# Patient Record
Sex: Female | Born: 1970 | Race: White | Hispanic: No | Marital: Single | Smoking: Former smoker
Health system: Southern US, Community
[De-identification: ages and names within clinical notes are randomized; demographics above are authoritative.]

## PROBLEM LIST (undated history)

## (undated) DIAGNOSIS — F32A Depression, unspecified: Secondary | ICD-10-CM

## (undated) DIAGNOSIS — E785 Hyperlipidemia, unspecified: Secondary | ICD-10-CM

## (undated) DIAGNOSIS — I509 Heart failure, unspecified: Secondary | ICD-10-CM

## (undated) DIAGNOSIS — F4310 Post-traumatic stress disorder, unspecified: Secondary | ICD-10-CM

## (undated) DIAGNOSIS — R9389 Abnormal findings on diagnostic imaging of other specified body structures: Secondary | ICD-10-CM

## (undated) DIAGNOSIS — F419 Anxiety disorder, unspecified: Secondary | ICD-10-CM

## (undated) DIAGNOSIS — Z720 Tobacco use: Secondary | ICD-10-CM

## (undated) DIAGNOSIS — I5020 Unspecified systolic (congestive) heart failure: Secondary | ICD-10-CM

## (undated) DIAGNOSIS — F329 Major depressive disorder, single episode, unspecified: Secondary | ICD-10-CM

## (undated) DIAGNOSIS — J449 Chronic obstructive pulmonary disease, unspecified: Secondary | ICD-10-CM

## (undated) DIAGNOSIS — I10 Essential (primary) hypertension: Secondary | ICD-10-CM

## (undated) HISTORY — DX: Hyperlipidemia, unspecified: E78.5

## (undated) HISTORY — DX: Tobacco use: Z72.0

## (undated) HISTORY — DX: Unspecified systolic (congestive) heart failure: I50.20

## (undated) HISTORY — DX: Heart failure, unspecified: I50.9

## (undated) HISTORY — DX: Abnormal findings on diagnostic imaging of other specified body structures: R93.89

## (undated) HISTORY — PX: BACK SURGERY: SHX140

---

## 1999-11-10 ENCOUNTER — Emergency Department (HOSPITAL_COMMUNITY): Admission: EM | Admit: 1999-11-10 | Discharge: 1999-11-10 | Payer: Self-pay | Admitting: Emergency Medicine

## 2001-12-23 ENCOUNTER — Encounter: Payer: Self-pay | Admitting: Emergency Medicine

## 2001-12-23 ENCOUNTER — Emergency Department (HOSPITAL_COMMUNITY): Admission: EM | Admit: 2001-12-23 | Discharge: 2001-12-23 | Payer: Self-pay | Admitting: Emergency Medicine

## 2003-03-05 ENCOUNTER — Emergency Department (HOSPITAL_COMMUNITY): Admission: EM | Admit: 2003-03-05 | Discharge: 2003-03-05 | Payer: Self-pay | Admitting: Emergency Medicine

## 2004-05-15 ENCOUNTER — Emergency Department (HOSPITAL_COMMUNITY): Admission: EM | Admit: 2004-05-15 | Discharge: 2004-05-15 | Payer: Self-pay | Admitting: Emergency Medicine

## 2004-07-26 ENCOUNTER — Ambulatory Visit (HOSPITAL_COMMUNITY): Admission: RE | Admit: 2004-07-26 | Discharge: 2004-07-26 | Payer: Self-pay | Admitting: *Deleted

## 2004-09-16 ENCOUNTER — Ambulatory Visit (HOSPITAL_COMMUNITY): Admission: RE | Admit: 2004-09-16 | Discharge: 2004-09-16 | Payer: Self-pay | Admitting: *Deleted

## 2006-02-14 ENCOUNTER — Emergency Department (HOSPITAL_COMMUNITY): Admission: EM | Admit: 2006-02-14 | Discharge: 2006-02-14 | Payer: Self-pay | Admitting: Emergency Medicine

## 2006-02-18 ENCOUNTER — Ambulatory Visit: Payer: Self-pay | Admitting: Internal Medicine

## 2006-03-22 ENCOUNTER — Emergency Department (HOSPITAL_COMMUNITY): Admission: EM | Admit: 2006-03-22 | Discharge: 2006-03-22 | Payer: Self-pay | Admitting: Emergency Medicine

## 2006-12-01 ENCOUNTER — Emergency Department (HOSPITAL_COMMUNITY): Admission: EM | Admit: 2006-12-01 | Discharge: 2006-12-01 | Payer: Self-pay | Admitting: Emergency Medicine

## 2007-04-20 ENCOUNTER — Ambulatory Visit (HOSPITAL_COMMUNITY): Admission: RE | Admit: 2007-04-20 | Discharge: 2007-04-21 | Payer: Self-pay | Admitting: Neurosurgery

## 2007-04-20 ENCOUNTER — Encounter: Payer: Self-pay | Admitting: Orthopedic Surgery

## 2007-09-19 ENCOUNTER — Ambulatory Visit (HOSPITAL_COMMUNITY): Admission: RE | Admit: 2007-09-19 | Discharge: 2007-09-19 | Payer: Self-pay | Admitting: Internal Medicine

## 2007-12-11 ENCOUNTER — Emergency Department (HOSPITAL_COMMUNITY): Admission: EM | Admit: 2007-12-11 | Discharge: 2007-12-11 | Payer: Self-pay | Admitting: Emergency Medicine

## 2008-04-30 ENCOUNTER — Emergency Department (HOSPITAL_COMMUNITY): Admission: EM | Admit: 2008-04-30 | Discharge: 2008-04-30 | Payer: Self-pay | Admitting: Emergency Medicine

## 2008-07-04 ENCOUNTER — Encounter (HOSPITAL_COMMUNITY): Admission: RE | Admit: 2008-07-04 | Discharge: 2008-08-03 | Payer: Self-pay | Admitting: Family Medicine

## 2008-09-05 ENCOUNTER — Ambulatory Visit (HOSPITAL_COMMUNITY): Admission: RE | Admit: 2008-09-05 | Discharge: 2008-09-05 | Payer: Self-pay | Admitting: Family Medicine

## 2008-10-07 ENCOUNTER — Emergency Department (HOSPITAL_COMMUNITY): Admission: EM | Admit: 2008-10-07 | Discharge: 2008-10-07 | Payer: Self-pay | Admitting: Emergency Medicine

## 2008-11-13 ENCOUNTER — Ambulatory Visit (HOSPITAL_COMMUNITY): Admission: RE | Admit: 2008-11-13 | Discharge: 2008-11-13 | Payer: Self-pay | Admitting: Internal Medicine

## 2008-11-13 ENCOUNTER — Encounter: Payer: Self-pay | Admitting: Orthopedic Surgery

## 2010-04-27 ENCOUNTER — Ambulatory Visit: Payer: Self-pay | Admitting: Orthopedic Surgery

## 2010-04-27 DIAGNOSIS — M169 Osteoarthritis of hip, unspecified: Secondary | ICD-10-CM | POA: Insufficient documentation

## 2010-04-27 DIAGNOSIS — G8929 Other chronic pain: Secondary | ICD-10-CM | POA: Insufficient documentation

## 2010-04-27 DIAGNOSIS — M25559 Pain in unspecified hip: Secondary | ICD-10-CM | POA: Insufficient documentation

## 2010-04-27 DIAGNOSIS — M5126 Other intervertebral disc displacement, lumbar region: Secondary | ICD-10-CM | POA: Insufficient documentation

## 2010-04-28 ENCOUNTER — Telehealth: Payer: Self-pay | Admitting: Orthopedic Surgery

## 2010-08-26 ENCOUNTER — Ambulatory Visit (HOSPITAL_COMMUNITY): Admission: RE | Admit: 2010-08-26 | Payer: Self-pay | Source: Home / Self Care | Admitting: Internal Medicine

## 2010-09-05 ENCOUNTER — Encounter: Payer: Self-pay | Admitting: *Deleted

## 2010-09-06 ENCOUNTER — Encounter: Payer: Self-pay | Admitting: Internal Medicine

## 2010-09-16 ENCOUNTER — Encounter: Payer: Self-pay | Admitting: Internal Medicine

## 2010-09-17 NOTE — Progress Notes (Signed)
Summary: Referral to Dr. Caswell Corwin.  Phone Note Outgoing Call   Call placed by: Waldon Reining,  April 28, 2010 12:17 PM Call placed to: Specialist Action Taken: Information Sent Summary of Call: I faxed a referral for this patient to Dr. Caswell Corwin for bilateral hip disease.

## 2010-09-17 NOTE — Assessment & Plan Note (Signed)
Summary: RT HIP PAIN NEEDS XR/MEDICAID/FUSCO   Vital Signs:  Patient profile:   40 year old female Height:      64 inches Weight:      239 pounds Pulse rate:   80 / minute Resp:     18 per minute  Vitals Entered By: Fuller Canada MD (April 27, 2010 10:20 AM)  Visit Type:  new patient Referring Baird Polinski:  Dr. Sherwood Gambler Primary Laquia Rosano:  Dr. Sherwood Gambler  CC:  back pain and leg pain.  History of Present Illness: I saw Holly Fuentes in the office today for an initial visit.  she is a 40 year old female who has had 2 back procedures on the lumbar spine and now presents with RIGHT hip pain radiating down the front of the leg into the knee and occasionally into the foot with a catching sensation and giving way sensation in the RIGHT hip associated with numbness and tingling in the foot and RIGHT groin pain  She complains of sharp stabbing burning pain which is 9/10 despite being on Percocet 10 mg with Flexeril.  She does get relief of her back pain with this but not the leg pain.  The symptoms seem to come and go she relates no specific injury.  Meds: Percocet 10, Xanax, Ambien, Magnesium, Flexeril, Citalopram, Mitrazaphine, Banitidine.    Allergies (verified): 1)  ! Vicodin  Past History:  Past Medical History: Anxiety Depression Migraines Arthritis Back pain PTSD  Past Surgical History: Back surgery C-section  Family History: Family History of Diabetes Family History Coronary Heart Disease female < 66 Family History of Arthritis Hx, family, asthma Hx, family, kidney disease NEC  Social History: Patient is single.  unemployed no smoking no alcohol caffeine daily 11th grade ed.  Physical Exam  Additional Exam:  GEN: well developed, well nourished, normal grooming and hygiene, no deformity and normal body habitus. weight is 239 pounds; height is 5 feet 4 inches.  CDV: lower extremities pulses are normal, no edema, no erythema. no tenderness  Lymph: groin areas  normal lymph nodes   Skin: lower extremity no rashes, skin lesions or open sores   NEURO: lower extremities normal coordination, reflexes were 1+ at the knee zero at the ankle toes were downgoing.  Psyche: awake, alert and oriented. Mood normal   Gait: no gait abnormality was detected  She had decreased hip flexion on the RIGHT with pain.  Had decreased internal rotation with pain as well.  She had normal muscle tone and her hip was stable in the posterior pole maneuver and she had increased pain with internal rotation and extension.  Flexion internal rotation test was also positive.  She had better motion on the LEFT side no tenderness on the greater trochanter, she still had some discomfort with internal rotation muscle tone is normal hip was stable  showed positive femoral nerve stretch test on the RIGHT with pain radiating to the knee   Impression & Recommendations:  Problem # 1:  HIP, ARTHRITIS, DEGEN./OSTEO (ICD-715.95)  MRI T and L spine 2010 reviewed.  X-rays of the pelvis and hip show that she has bilateral hip disease with cam and pincer type femoral head acetabular abnormalities and medial joint space narrowing. She has signs of femoral acetabular impingement syndrome as well  Based on her age her size and her chronic pain I would recommend that she see Dr. Caswell Corwin for possible hip dislocation vs. hip replacement.  I think she should also see her neurosurgeon because hip pathology does not cause  numbness in the foot or leg.  She did have a positive femoral nerve stretch test today.  She wants to be seen for her knees and I will be done on a separate visit.  She complains of crunching.  Orders: New Patient Level IV ([address]) Pelvis x-ray, 1/2 views (60454) Hip x-ray unilateral complete, minimum 2 views (09811)  Other Orders: Orthopedic Surgeon Referral (Ortho Surgeon)  Patient Instructions: 1)  You have been referred to [facility] [Dr Stubbs]

## 2010-09-17 NOTE — Letter (Signed)
Summary: *Consult Note  [facility]  [address] [place] [address]  [place], [place] [address]   Phone: [phone]  Fax: [phone]    Re:    Holly Fuentes DOB:    1971/03/05   Dear: Lyman Bishop   Thank you for requesting that we see the above patient for consultation.  A copy of the detailed office note will be sent under separate cover, for your review.  Evaluation today is consistent with:  1)  OTHER CHRONIC PAIN (ICD-338.29) 2)  DEGENERATIVE DISC DISEASE, LUMBOSACRAL SPINE W/RADICULOPATHY (ICD-722.10) 3)  HIP, ARTHRITIS, DEGEN./OSTEO (ICD-715.95) AMILY HISTORY OF DIABETES (ICD-V18.0)   Our recommendation is for: referral to [facility] for possible hip dislocation.  At her age of 44 she has significant hip arthritis and femoral acetabular impingement on x-ray.  She is overweight and is chronically in pain.   New Orders include:  1)  Orthopedic Surgeon Referral [Ortho Surgeon] 2)  New Patient Level IV [99204] 3)  Pelvis x-ray, 1/2 views [72170] 4)  Hip x-ray unilateral complete, minimum 2 views [73510]   New Medications started today include: none    Thank you for this consultation.  If you have any further questions regarding the care of this patient, please do not hesitate to contact me @ [phone]  Thank you for this opportunity to look after your patient.  Sincerely,   Fuller Canada MD

## 2010-09-30 ENCOUNTER — Emergency Department (INDEPENDENT_AMBULATORY_CARE_PROVIDER_SITE_OTHER): Payer: No Typology Code available for payment source

## 2010-09-30 ENCOUNTER — Emergency Department (HOSPITAL_BASED_OUTPATIENT_CLINIC_OR_DEPARTMENT_OTHER)
Admission: EM | Admit: 2010-09-30 | Discharge: 2010-09-30 | Disposition: A | Discharge status: Home / Self Care | Payer: No Typology Code available for payment source | Attending: Emergency Medicine | Admitting: Emergency Medicine

## 2010-09-30 DIAGNOSIS — G8929 Other chronic pain: Secondary | ICD-10-CM | POA: Insufficient documentation

## 2010-09-30 DIAGNOSIS — M542 Cervicalgia: Secondary | ICD-10-CM | POA: Insufficient documentation

## 2010-09-30 DIAGNOSIS — M169 Osteoarthritis of hip, unspecified: Secondary | ICD-10-CM

## 2010-09-30 DIAGNOSIS — M1710 Unilateral primary osteoarthritis, unspecified knee: Secondary | ICD-10-CM

## 2010-09-30 DIAGNOSIS — Z043 Encounter for examination and observation following other accident: Secondary | ICD-10-CM

## 2010-09-30 DIAGNOSIS — M25559 Pain in unspecified hip: Secondary | ICD-10-CM | POA: Insufficient documentation

## 2010-09-30 DIAGNOSIS — R51 Headache: Secondary | ICD-10-CM | POA: Insufficient documentation

## 2010-09-30 DIAGNOSIS — Y92410 Unspecified street and highway as the place of occurrence of the external cause: Secondary | ICD-10-CM | POA: Insufficient documentation

## 2010-09-30 DIAGNOSIS — S5000XA Contusion of unspecified elbow, initial encounter: Secondary | ICD-10-CM | POA: Insufficient documentation

## 2010-09-30 DIAGNOSIS — M25529 Pain in unspecified elbow: Secondary | ICD-10-CM

## 2010-09-30 DIAGNOSIS — IMO0002 Reserved for concepts with insufficient information to code with codable children: Secondary | ICD-10-CM

## 2010-10-07 ENCOUNTER — Emergency Department (HOSPITAL_COMMUNITY)
Admission: EM | Admit: 2010-10-07 | Discharge: 2010-10-07 | Disposition: A | Discharge status: Home / Self Care | Payer: No Typology Code available for payment source | Attending: Emergency Medicine | Admitting: Emergency Medicine

## 2010-10-07 ENCOUNTER — Emergency Department (HOSPITAL_COMMUNITY): Payer: No Typology Code available for payment source

## 2010-10-07 DIAGNOSIS — M25529 Pain in unspecified elbow: Secondary | ICD-10-CM | POA: Insufficient documentation

## 2010-10-07 DIAGNOSIS — F319 Bipolar disorder, unspecified: Secondary | ICD-10-CM | POA: Insufficient documentation

## 2010-10-07 DIAGNOSIS — S52123A Displaced fracture of head of unspecified radius, initial encounter for closed fracture: Secondary | ICD-10-CM | POA: Insufficient documentation

## 2010-12-29 NOTE — Op Note (Signed)
Holly Fuentes, Holly Fuentes                ACCOUNT NO.:  [id number]   MEDICAL RECORD NO.:  [id number]          PATIENT TYPE:  OIB   LOCATION:  3172                         FACILITY:  [facility]   PHYSICIAN:  Danae Orleans. Venetia Maxon, M.D.  DATE OF BIRTH:  05/17/1971   DATE OF PROCEDURE:  04/20/2007  DATE OF DISCHARGE:                               OPERATIVE REPORT   PREOPERATIVE DIAGNOSIS:  Left L5-S1 herniated disk with degenerative  disease, spondylosis, radiculopathy and morbid obesity.   FINAL DIAGNOSIS:  Left L5-S1 herniated disk with degenerative disease,  spondylosis, radiculopathy and morbid obesity.   PROCEDURES:  Left L5-S1 microdiskectomy with microdissection.   SURGEON:  Danae Orleans. Venetia Maxon, M.D.   ASSISTANTMichail Jewels, R.N., and Hilda Lias, M.D.   ANESTHESIA:  General endotracheal anesthesia.   BLOOD LOSS:  Minimal.   COMPLICATIONS:  None.   DISPOSITION:  The patient recovery.   INDICATIONS:  Holly Fuentes is a 40 year old woman with severe left leg  pain with a large disk herniation at L5-S1 on the left.  She has had  previous injection therapy without relief of her pain.  It was elected  to take her to surgery for microdiskectomy.   PROCEDURE:  Holly Fuentes was brought to the operating room.  Following  satisfactory and uncomplicated induction of general endotracheal  anesthesia, the patient was placed in prone position on the Wilson  frame.  Soft tissue and bony prominences were padded appropriately.  Her  low back was then prepped and draped in the usual sterile fashion.  Point of incision was infiltrated with 0.25% Marcaine and 0.5% lidocaine  with 1:200,000 epinephrine.  Incision was made overlying what was felt  to be the L5-S1 level and carried through approximately 5 inches of  adipose tissue to the lumbodorsal fascia which incised on the left side  of midline.  Subperiosteal dissection was performed exposing the L5-S1  interspace.  Intraoperative x-ray was obtained with a  marker probe at  the L5-S1 interspace.  Subsequently, hemi-semi-laminectomy of L5 was  performed with a high-speed drill and completed with Kerrison rongeur,  and a foraminotomy was performed overlying the S1 nerve root.  Ligamentum flavum was detached and removed in a piecemeal fashion.  The  operating microscope was brought into field, and using microdissection  technique, the S1 nerve root was identified along with the thecal sac.  These were carefully mobilized and retracted medially, and there was an  obvious annular rent and multiple fragments of disk material removed  from within this annular rent.  Subsequently, the disk extended into the  interspace, and this was more aggressively cleared of residual disk  material using a variety of pituitary rongeurs, Epstein curettes, both  medial and lateral decompression of the thecal sac and the S1 nerve  root.  The osteophytic lip of S1 was then taken down with Kerrison  rongeur, and it was felt at this point that there was sufficient  decompression of the thecal sac and S1 nerve root.  The wound was then  irrigated.  The interspace was irrigated.  No evidence of residual disk  material.  The operative site was then bathed in Depo-Medrol and  fentanyl.  The self-retaining tractor was removed.  The lumbodorsal  fascia was closed with 0 Vicryl sutures.  Subcutaneous tissues were  reapproximated with 2-0 Vicryl interrupted inverted sutures, and the  skin edges were approximated with a 3-0 Vicryl subcuticular stitch.  The  wound was dressed with Dermabond.  The patient was extubated in the  operating room and taken to recovery in stable and satisfactory having  tolerated the procedure well.      Danae Orleans. Venetia Maxon, M.D.  Electronically Signed     JDS/MEDQ  D:  04/20/2007  T:  04/20/2007  Job:  [id number]

## 2011-05-28 LAB — CBC
HCT: 43
Hemoglobin: 14.7
RBC: 4.92
WBC: 11.8 — ABNORMAL HIGH

## 2011-06-09 ENCOUNTER — Ambulatory Visit: Payer: No Typology Code available for payment source | Attending: Orthopedic Surgery | Admitting: Occupational Therapy

## 2011-06-09 DIAGNOSIS — M6281 Muscle weakness (generalized): Secondary | ICD-10-CM | POA: Insufficient documentation

## 2011-06-09 DIAGNOSIS — M2550 Pain in unspecified joint: Secondary | ICD-10-CM | POA: Insufficient documentation

## 2011-06-09 DIAGNOSIS — M2560 Stiffness of unspecified joint, not elsewhere classified: Secondary | ICD-10-CM | POA: Insufficient documentation

## 2011-06-09 DIAGNOSIS — IMO0001 Reserved for inherently not codable concepts without codable children: Secondary | ICD-10-CM | POA: Insufficient documentation

## 2011-06-15 ENCOUNTER — Ambulatory Visit: Payer: No Typology Code available for payment source | Admitting: Occupational Therapy

## 2011-06-17 ENCOUNTER — Ambulatory Visit: Payer: No Typology Code available for payment source | Attending: Orthopedic Surgery | Admitting: Occupational Therapy

## 2011-06-17 ENCOUNTER — Encounter: Payer: No Typology Code available for payment source | Admitting: Occupational Therapy

## 2011-06-17 DIAGNOSIS — M2550 Pain in unspecified joint: Secondary | ICD-10-CM | POA: Insufficient documentation

## 2011-06-17 DIAGNOSIS — IMO0001 Reserved for inherently not codable concepts without codable children: Secondary | ICD-10-CM | POA: Insufficient documentation

## 2011-06-17 DIAGNOSIS — M2560 Stiffness of unspecified joint, not elsewhere classified: Secondary | ICD-10-CM | POA: Insufficient documentation

## 2011-06-17 DIAGNOSIS — M6281 Muscle weakness (generalized): Secondary | ICD-10-CM | POA: Insufficient documentation

## 2011-06-21 ENCOUNTER — Ambulatory Visit: Payer: No Typology Code available for payment source | Admitting: Occupational Therapy

## 2011-06-23 ENCOUNTER — Ambulatory Visit: Payer: No Typology Code available for payment source | Admitting: Occupational Therapy

## 2011-06-28 ENCOUNTER — Ambulatory Visit: Payer: No Typology Code available for payment source | Admitting: Occupational Therapy

## 2011-06-30 ENCOUNTER — Ambulatory Visit: Payer: No Typology Code available for payment source | Admitting: Occupational Therapy

## 2011-07-05 ENCOUNTER — Ambulatory Visit: Payer: No Typology Code available for payment source | Admitting: *Deleted

## 2011-07-06 ENCOUNTER — Encounter: Payer: No Typology Code available for payment source | Admitting: Occupational Therapy

## 2011-07-07 ENCOUNTER — Ambulatory Visit: Payer: No Typology Code available for payment source | Admitting: Occupational Therapy

## 2011-07-12 ENCOUNTER — Ambulatory Visit: Payer: No Typology Code available for payment source | Admitting: Occupational Therapy

## 2011-07-12 ENCOUNTER — Encounter: Payer: No Typology Code available for payment source | Admitting: *Deleted

## 2011-07-14 ENCOUNTER — Encounter: Payer: No Typology Code available for payment source | Admitting: *Deleted

## 2011-07-19 ENCOUNTER — Ambulatory Visit: Payer: No Typology Code available for payment source | Attending: Orthopedic Surgery | Admitting: Occupational Therapy

## 2011-07-19 DIAGNOSIS — M2550 Pain in unspecified joint: Secondary | ICD-10-CM | POA: Insufficient documentation

## 2011-07-19 DIAGNOSIS — IMO0001 Reserved for inherently not codable concepts without codable children: Secondary | ICD-10-CM | POA: Insufficient documentation

## 2011-07-19 DIAGNOSIS — M6281 Muscle weakness (generalized): Secondary | ICD-10-CM | POA: Insufficient documentation

## 2011-07-19 DIAGNOSIS — M2560 Stiffness of unspecified joint, not elsewhere classified: Secondary | ICD-10-CM | POA: Insufficient documentation

## 2011-07-21 ENCOUNTER — Ambulatory Visit: Payer: No Typology Code available for payment source | Admitting: *Deleted

## 2011-07-26 ENCOUNTER — Ambulatory Visit: Payer: No Typology Code available for payment source | Admitting: *Deleted

## 2011-07-28 ENCOUNTER — Ambulatory Visit: Payer: No Typology Code available for payment source | Admitting: *Deleted

## 2011-08-03 ENCOUNTER — Encounter: Payer: No Typology Code available for payment source | Admitting: Occupational Therapy

## 2011-08-18 ENCOUNTER — Inpatient Hospital Stay (HOSPITAL_COMMUNITY)
Admission: AD | Admit: 2011-08-18 | Discharge: 2011-08-18 | Disposition: A | Discharge status: Home / Self Care | Payer: Self-pay | Source: Ambulatory Visit | Attending: Obstetrics and Gynecology | Admitting: Obstetrics and Gynecology

## 2011-08-18 ENCOUNTER — Encounter (HOSPITAL_COMMUNITY): Payer: Self-pay | Admitting: *Deleted

## 2011-08-18 ENCOUNTER — Inpatient Hospital Stay (HOSPITAL_COMMUNITY): Payer: Self-pay

## 2011-08-18 DIAGNOSIS — N946 Dysmenorrhea, unspecified: Secondary | ICD-10-CM | POA: Insufficient documentation

## 2011-08-18 DIAGNOSIS — N390 Urinary tract infection, site not specified: Secondary | ICD-10-CM | POA: Insufficient documentation

## 2011-08-18 HISTORY — DX: Post-traumatic stress disorder, unspecified: F43.10

## 2011-08-18 HISTORY — DX: Anxiety disorder, unspecified: F41.9

## 2011-08-18 LAB — CBC
MCH: 30.8 pg (ref 26.0–34.0)
MCHC: 33.9 g/dL (ref 30.0–36.0)
MCV: 91 fL (ref 78.0–100.0)
Platelets: 364 10*3/uL (ref 150–400)
RBC: 4.54 MIL/uL (ref 3.87–5.11)

## 2011-08-18 LAB — URINALYSIS, ROUTINE W REFLEX MICROSCOPIC
Bilirubin Urine: NEGATIVE
Leukocytes, UA: NEGATIVE
Nitrite: POSITIVE — AB
Urobilinogen, UA: 2 mg/dL — ABNORMAL HIGH (ref 0.0–1.0)

## 2011-08-18 LAB — URINE MICROSCOPIC-ADD ON

## 2011-08-18 LAB — WET PREP, GENITAL: Yeast Wet Prep HPF POC: NONE SEEN

## 2011-08-18 LAB — POCT PREGNANCY, URINE: Preg Test, Ur: NEGATIVE

## 2011-08-18 MED ORDER — SULFAMETHOXAZOLE-TRIMETHOPRIM 800-160 MG PO TABS: 1.0000 | ORAL_TABLET | Freq: Two times a day (BID) | ORAL | Status: AC

## 2011-08-18 NOTE — Progress Notes (Signed)
Pt in c/o spotting x 2 days and a gush of bright red bleeding around 0300 this morning.  States bleeding has slowed down to light bleeding.  Reports a milky, blood tinged discharge.  Reports lower abdominal cramping since around 0300.  + UPT a month ago.  No PNC. Has appt with social services next week.

## 2011-08-18 NOTE — Progress Notes (Signed)
Patient states she has had spotting for 2 days, this am was a gush of bright red bleeding, less now. Started having cramping this am. Had a positive home pregnancy test about 1 1/2 months ago.
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
## 2011-08-23 NOTE — ED Provider Notes (Signed)
Agree with above note.  Holly Fuentes 08/23/2011 10:12 AM

## 2011-10-13 ENCOUNTER — Encounter (HOSPITAL_COMMUNITY): Payer: Self-pay | Admitting: *Deleted

## 2011-10-13 ENCOUNTER — Emergency Department (INDEPENDENT_AMBULATORY_CARE_PROVIDER_SITE_OTHER)
Admission: EM | Admit: 2011-10-13 | Discharge: 2011-10-13 | Disposition: A | Discharge status: Home / Self Care | Payer: Self-pay | Source: Home / Self Care | Attending: Family Medicine | Admitting: Family Medicine

## 2011-10-13 DIAGNOSIS — J36 Peritonsillar abscess: Secondary | ICD-10-CM

## 2011-10-13 NOTE — ED Notes (Signed)
Pt states she's had sore throat and right ear pain for 3 days.  Has had bodyaches, chills and headache also.  Throat is very red and extremely swollen on right side.  No exudate noted.

## 2011-10-13 NOTE — ED Provider Notes (Signed)
History     CSN: [id number]  Arrival date & time 10/13/11  1259   First MD Initiated Contact with Patient 10/13/11 1344      Chief Complaint  Patient presents with  . Sore Throat  . Otalgia    (Consider location/radiation/quality/duration/timing/severity/associated sxs/prior treatment) HPI Comments: Berit presents for evaluation of RIGHT sided throat pain, RIGHT ear pain, difficulty swallowing, and a change in her voice, over the last 3 days. She denies any recent hx of strep throat or URI. She denies any fever. She does report body ache and headache.   Patient is a 41 y.o. female presenting with pharyngitis. The history is provided by the patient.  Sore Throat This is a new problem. The current episode started more than 2 days ago. The problem occurs constantly. The problem has been gradually worsening. The symptoms are aggravated by swallowing, eating and drinking. The symptoms are relieved by nothing.    Past Medical History  Diagnosis Date  . PTSD (post-traumatic stress disorder)   . Anxiety     Past Surgical History  Procedure Date  . Cesarean section   . Back surgery     Family History  Problem Relation Age of Onset  . Hypertension Mother   . Cancer Maternal Grandmother   . Heart disease Maternal Grandmother   . Heart disease Maternal Grandfather     History  Substance Use Topics  . Smoking status: Current Everyday Smoker -- 0.5 packs/day    Types: Cigarettes  . Smokeless tobacco: Not on file  . Alcohol Use: No    OB History    Grav Para Term Preterm Abortions TAB SAB Ect Mult Living   4 1 1  0 2 0 2 0 0 1      Review of Systems  Constitutional: Negative.   HENT: Positive for ear pain, sore throat, trouble swallowing and voice change. Negative for congestion, rhinorrhea, drooling, neck pain and neck stiffness.   Eyes: Negative.   Respiratory: Negative.   Cardiovascular: Negative.   Gastrointestinal: Negative.   Genitourinary: Negative.     Musculoskeletal: Negative.   Skin: Negative.   Neurological: Negative.     Allergies  Cranberry and Hydrocodone-acetaminophen  Home Medications   Current Outpatient Rx  Name Route Sig Dispense Refill  . ACETAMINOPHEN 325 MG PO TABS Oral Take 650 mg by mouth every 6 (six) hours as needed. Takes for pain       BP 140/82  Pulse 104  Temp(Src) 99.7 F (37.6 C) (Oral)  Resp 18  SpO2 96%  LMP 10/03/2011  Physical Exam  Nursing note and vitals reviewed. Constitutional: She is oriented to person, place, and time. She appears well-developed and well-nourished.  HENT:  Head: Normocephalic and atraumatic. No trismus in the jaw.  Mouth/Throat: No uvula swelling. Tonsillar abscesses present.       RIGHT parapharyngeal swelling, with uvula shift to the LEFT  Eyes: EOM are normal.  Neck: Normal range of motion.  Pulmonary/Chest: Effort normal.  Musculoskeletal: Normal range of motion.  Lymphadenopathy:    She has cervical adenopathy.       Right cervical: Superficial cervical adenopathy present.       Left cervical: Superficial cervical adenopathy present.  Neurological: She is alert and oriented to person, place, and time.  Skin: Skin is warm and dry.  Psychiatric: Her behavior is normal.    ED Course  Procedures (including critical care time)  Labs Reviewed - No data to display No results found.  1. Peritonsillar abscess       MDM  Spoke with [facility], Dr. Lazarus Salines will see patient.        Richardo Priest, MD 10/13/11 2029

## 2011-10-13 NOTE — Discharge Instructions (Signed)
I have spoken with [facility]. They are expecting you and Dr. Lazarus Salines will see you and evaluate you for what I suspect is an infection in the back of your throat. Please present to their office at the address listed, and as discussed.
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
## 2013-10-31 ENCOUNTER — Encounter (HOSPITAL_COMMUNITY): Payer: Self-pay | Admitting: Emergency Medicine

## 2013-10-31 ENCOUNTER — Emergency Department (HOSPITAL_COMMUNITY)
Admission: EM | Admit: 2013-10-31 | Discharge: 2013-11-01 | Disposition: A | Discharge status: Other Acute Inpatient Hospital | Payer: Medicaid Other | Attending: Emergency Medicine | Admitting: Emergency Medicine

## 2013-10-31 DIAGNOSIS — G4700 Insomnia, unspecified: Secondary | ICD-10-CM | POA: Insufficient documentation

## 2013-10-31 DIAGNOSIS — R45851 Suicidal ideations: Secondary | ICD-10-CM

## 2013-10-31 DIAGNOSIS — F3289 Other specified depressive episodes: Secondary | ICD-10-CM | POA: Insufficient documentation

## 2013-10-31 DIAGNOSIS — F411 Generalized anxiety disorder: Secondary | ICD-10-CM | POA: Insufficient documentation

## 2013-10-31 DIAGNOSIS — F17200 Nicotine dependence, unspecified, uncomplicated: Secondary | ICD-10-CM | POA: Insufficient documentation

## 2013-10-31 DIAGNOSIS — F329 Major depressive disorder, single episode, unspecified: Secondary | ICD-10-CM | POA: Insufficient documentation

## 2013-10-31 LAB — CBC WITH DIFFERENTIAL/PLATELET
Basophils Absolute: 0 10*3/uL (ref 0.0–0.1)
Basophils Relative: 0 % (ref 0–1)
EOS ABS: 0.1 10*3/uL (ref 0.0–0.7)
EOS PCT: 0 % (ref 0–5)
HEMATOCRIT: 41.8 % (ref 36.0–46.0)
Hemoglobin: 14.2 g/dL (ref 12.0–15.0)
LYMPHS ABS: 3 10*3/uL (ref 0.7–4.0)
Lymphocytes Relative: 18 % (ref 12–46)
MCH: 30.9 pg (ref 26.0–34.0)
MCHC: 34 g/dL (ref 30.0–36.0)
MCV: 91.1 fL (ref 78.0–100.0)
MONO ABS: 0.7 10*3/uL (ref 0.1–1.0)
Monocytes Relative: 5 % (ref 3–12)
Neutro Abs: 12.6 10*3/uL — ABNORMAL HIGH (ref 1.7–7.7)
Neutrophils Relative %: 77 % (ref 43–77)
PLATELETS: 364 10*3/uL (ref 150–400)
RBC: 4.59 MIL/uL (ref 3.87–5.11)
RDW: 12.8 % (ref 11.5–15.5)
WBC: 16.3 10*3/uL — ABNORMAL HIGH (ref 4.0–10.5)

## 2013-10-31 LAB — COMPREHENSIVE METABOLIC PANEL
ALT: 9 U/L (ref 0–35)
AST: 14 U/L (ref 0–37)
Albumin: 3.8 g/dL (ref 3.5–5.2)
Alkaline Phosphatase: 82 U/L (ref 39–117)
BUN: 13 mg/dL (ref 6–23)
CALCIUM: 8.8 mg/dL (ref 8.4–10.5)
CO2: 23 meq/L (ref 19–32)
CREATININE: 0.78 mg/dL (ref 0.50–1.10)
Chloride: 103 mEq/L (ref 96–112)
GLUCOSE: 132 mg/dL — AB (ref 70–99)
Potassium: 3.5 mEq/L — ABNORMAL LOW (ref 3.7–5.3)
SODIUM: 140 meq/L (ref 137–147)
TOTAL PROTEIN: 8 g/dL (ref 6.0–8.3)
Total Bilirubin: 0.2 mg/dL — ABNORMAL LOW (ref 0.3–1.2)

## 2013-10-31 LAB — ETHANOL: Alcohol, Ethyl (B): <11 mg/dL (ref 0–11)

## 2013-10-31 MED ORDER — LORAZEPAM 1 MG PO TABS
1.0000 mg | ORAL_TABLET | Freq: Once | ORAL | Status: AC
  Administered 2013-10-31: 1 mg via ORAL
  Filled 2013-10-31: qty 1

## 2013-10-31 MED ORDER — ZOLPIDEM TARTRATE 5 MG PO TABS: 5.0000 mg | ORAL_TABLET | Freq: Every evening | ORAL | Status: DC | PRN

## 2013-10-31 MED ORDER — IBUPROFEN 400 MG PO TABS
600.0000 mg | ORAL_TABLET | Freq: Three times a day (TID) | ORAL | Status: DC | PRN
  Administered 2013-11-01: 600 mg via ORAL
  Filled 2013-10-31: qty 2

## 2013-10-31 MED ORDER — LORAZEPAM 1 MG PO TABS
1.0000 mg | ORAL_TABLET | Freq: Three times a day (TID) | ORAL | Status: DC | PRN
  Administered 2013-11-01 (×2): 1 mg via ORAL
  Filled 2013-10-31 (×2): qty 1

## 2013-10-31 MED ORDER — ONDANSETRON HCL 4 MG PO TABS
4.0000 mg | ORAL_TABLET | Freq: Three times a day (TID) | ORAL | Status: DC | PRN
  Administered 2013-11-01: 4 mg via ORAL
  Filled 2013-10-31: qty 1

## 2013-10-31 NOTE — ED Notes (Signed)
Pt states she has been hurting herself for awhile. She punches herself and cuts herself and want help.
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
## 2013-11-01 ENCOUNTER — Inpatient Hospital Stay (HOSPITAL_COMMUNITY)
Admission: AD | Admit: 2013-11-01 | Discharge: 2013-11-04 | DRG: 885 | Disposition: A | Discharge status: Home / Self Care | Payer: 59 | Source: Intra-hospital | Attending: Psychiatry | Admitting: Psychiatry

## 2013-11-01 ENCOUNTER — Encounter (HOSPITAL_COMMUNITY): Payer: Self-pay

## 2013-11-01 DIAGNOSIS — IMO0002 Reserved for concepts with insufficient information to code with codable children: Secondary | ICD-10-CM

## 2013-11-01 DIAGNOSIS — Z79899 Other long term (current) drug therapy: Secondary | ICD-10-CM

## 2013-11-01 DIAGNOSIS — F17200 Nicotine dependence, unspecified, uncomplicated: Secondary | ICD-10-CM | POA: Diagnosis present

## 2013-11-01 DIAGNOSIS — F332 Major depressive disorder, recurrent severe without psychotic features: Principal | ICD-10-CM | POA: Diagnosis present

## 2013-11-01 DIAGNOSIS — F4310 Post-traumatic stress disorder, unspecified: Secondary | ICD-10-CM

## 2013-11-01 DIAGNOSIS — I10 Essential (primary) hypertension: Secondary | ICD-10-CM | POA: Diagnosis present

## 2013-11-01 DIAGNOSIS — G4700 Insomnia, unspecified: Secondary | ICD-10-CM | POA: Diagnosis present

## 2013-11-01 DIAGNOSIS — R45851 Suicidal ideations: Secondary | ICD-10-CM

## 2013-11-01 HISTORY — DX: Essential (primary) hypertension: I10

## 2013-11-01 HISTORY — DX: Major depressive disorder, single episode, unspecified: F32.9

## 2013-11-01 HISTORY — DX: Depression, unspecified: F32.A

## 2013-11-01 LAB — RAPID URINE DRUG SCREEN, HOSP PERFORMED
AMPHETAMINES: NOT DETECTED
BENZODIAZEPINES: NOT DETECTED
Barbiturates: NOT DETECTED
COCAINE: NOT DETECTED
OPIATES: NOT DETECTED
Tetrahydrocannabinol: NOT DETECTED

## 2013-11-01 MED ORDER — POTASSIUM CHLORIDE CRYS ER 20 MEQ PO TBCR
40.0000 meq | EXTENDED_RELEASE_TABLET | Freq: Once | ORAL | Status: AC
  Administered 2013-11-01: 40 meq via ORAL
  Filled 2013-11-01: qty 2

## 2013-11-01 MED ORDER — NICOTINE POLACRILEX 2 MG MT GUM
2.0000 mg | CHEWING_GUM | OROMUCOSAL | Status: DC | PRN
  Administered 2013-11-01: 2 mg via ORAL
  Filled 2013-11-01: qty 1

## 2013-11-01 MED ORDER — IBUPROFEN 600 MG PO TABS
600.0000 mg | ORAL_TABLET | Freq: Four times a day (QID) | ORAL | Status: DC | PRN
  Administered 2013-11-01 – 2013-11-03 (×2): 600 mg via ORAL
  Filled 2013-11-01 (×2): qty 1

## 2013-11-01 MED ORDER — TRAZODONE HCL 50 MG PO TABS
50.0000 mg | ORAL_TABLET | Freq: Every evening | ORAL | Status: DC | PRN
Start: 2013-11-01 — End: 2013-11-03
  Administered 2013-11-01 – 2013-11-02 (×2): 50 mg via ORAL
  Filled 2013-11-01 (×2): qty 1

## 2013-11-01 MED ORDER — ALUM & MAG HYDROXIDE-SIMETH 200-200-20 MG/5ML PO SUSP: 30.0000 mL | ORAL | Status: DC | PRN

## 2013-11-01 MED ORDER — HYDROXYZINE HCL 25 MG PO TABS
25.0000 mg | ORAL_TABLET | ORAL | Status: DC | PRN
  Administered 2013-11-01: 25 mg via ORAL
  Filled 2013-11-01: qty 1

## 2013-11-01 MED ORDER — MAGNESIUM HYDROXIDE 400 MG/5ML PO SUSP
30.0000 mL | Freq: Every day | ORAL | Status: DC | PRN
Start: 2013-11-01 — End: 2013-11-04

## 2013-11-01 NOTE — ED Notes (Signed)
TSS consult in progress.  
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
## 2013-11-01 NOTE — ED Notes (Signed)
Patient lying in bed.  Respirations even and unlabored.  Patient awaiting telepsych.  Sitter remains with patient.
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
## 2013-11-02 DIAGNOSIS — IMO0002 Reserved for concepts with insufficient information to code with codable children: Secondary | ICD-10-CM

## 2013-11-02 DIAGNOSIS — F411 Generalized anxiety disorder: Secondary | ICD-10-CM

## 2013-11-02 DIAGNOSIS — F4310 Post-traumatic stress disorder, unspecified: Secondary | ICD-10-CM

## 2013-11-02 DIAGNOSIS — F332 Major depressive disorder, recurrent severe without psychotic features: Principal | ICD-10-CM

## 2013-11-02 MED ORDER — BUSPIRONE HCL 15 MG PO TABS
7.5000 mg | ORAL_TABLET | Freq: Two times a day (BID) | ORAL | Status: DC
  Administered 2013-11-02 – 2013-11-04 (×4): 7.5 mg via ORAL
  Filled 2013-11-02 (×6): qty 1
  Filled 2013-11-02 (×2): qty 14
  Filled 2013-11-02 (×2): qty 1

## 2013-11-02 MED ORDER — SERTRALINE HCL 50 MG PO TABS: 50.0000 mg | ORAL_TABLET | Freq: Every day | ORAL | Status: DC

## 2013-11-02 MED ORDER — HYDROXYZINE HCL 50 MG PO TABS: 50.0000 mg | ORAL_TABLET | ORAL | Status: DC | PRN

## 2013-11-02 MED ORDER — CITALOPRAM HYDROBROMIDE 20 MG PO TABS
20.0000 mg | ORAL_TABLET | Freq: Every day | ORAL | Status: DC
  Administered 2013-11-03 – 2013-11-04 (×2): 20 mg via ORAL
  Filled 2013-11-02 (×2): qty 1
  Filled 2013-11-02: qty 14
  Filled 2013-11-02 (×2): qty 1

## 2013-11-02 MED ORDER — DULOXETINE HCL 30 MG PO CPEP
30.0000 mg | ORAL_CAPSULE | Freq: Every day | ORAL | Status: DC
  Administered 2013-11-02: 30 mg via ORAL
  Filled 2013-11-02 (×4): qty 1

## 2013-11-02 MED ORDER — CLONAZEPAM 0.5 MG PO TABS
0.5000 mg | ORAL_TABLET | Freq: Two times a day (BID) | ORAL | Status: DC
  Administered 2013-11-02 – 2013-11-03 (×2): 0.5 mg via ORAL
  Filled 2013-11-02 (×2): qty 1
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
## 2013-11-02 NOTE — Progress Notes (Signed)
[facility] Group Notes:  (Nursing/MHT/Case Management/Adjunct)  Date:  11/02/2013  Time:  6:52 PM  Type of Therapy:  Therapeutic Activity  Participation Level:  Active  Participation Quality:  Appropriate, Attentive and Supportive  Affect:  Appropriate  Cognitive:  Appropriate  Insight:  Appropriate  Engagement in Group:  Engaged and Supportive  Modes of Intervention:  Activity  Summary of Progress/Problems: Pts played a game of "Human Bingo"  Holly Fuentes 11/02/2013, 6:52 PM 

## 2013-11-02 NOTE — [facility] Suicide Risk Assessment (Signed)
[facility] INPATIENT:  Family/Significant Other Suicide Prevention Education  Suicide Prevention Education:  Patient Refusal for Family/Significant Other Suicide Prevention Education: The patient Holly Fuentes has refused to provide written consent for family/significant other to be provided Family/Significant Other Suicide Prevention Education during admission and/or prior to discharge.  Physician notified.  Wynn BankerHodnett, Aleya Durnell Hairston 11/02/2013, 12:56 PM

## 2013-11-02 NOTE — Progress Notes (Signed)
D: Patient presents with flat, depressed affect and anxious mood. Also, patient is very tearful at times with pressured speech. She voiced early this morning that she would like to leave from here and told the MHT on the hall that she was going to escape from the hospital; Clinical research associatewriter informed the MD of the patient's remarks and concerns. Patient reported on the self inventory sheet that she requested medication for sleep, poor appetite and ability to pay attention, and low energy level. She rates depression and feelings of hopelessness "9". Patient is attending groups and no scheduled medications at this time.  A: Support and encouragement provided to patient. Maintain Q15 minute checks for safety.  R: Patient receptive. Denies SI/HI and auditory/visual hallucinations. Patient remains safe.

## 2013-11-02 NOTE — [facility] Group Notes (Signed)
[facility] LCSW Group Therapy  Feelings Around Relapse 1:15 - 2:30 PM  11/02/2013 2:49 PM  Type of Therapy:  Nurse Education  Participation Level:  Did Not Attend   Wynn BankerHodnett, Mylen Mangan Hairston 11/02/2013, 2:49 PM

## 2013-11-02 NOTE — Progress Notes (Signed)
NUTRITION ASSESSMENT  Pt identified as at risk on the Malnutrition Screen Tool  INTERVENTION: 1. Educated patient on the importance of nutrition and encouraged intake of food and beverages. 2. Discussed weight goals. 3. Supplements: none at this time.  NUTRITION DIAGNOSIS: Unintentional weight loss related to sub-optimal intake as evidenced by pt report.   Goal: Pt to meet >/= 90% of their estimated nutrition needs.  Monitor:  PO intake  Assessment:  Patient admitted with SI and PTSD.  Patient reports a poor appetite prior to admit which is improving.  Poor appetite and intake for the past 3 weeks with a UBW of 193-210 lbs.  4% loss of weight in the past 3 weeks and a 13% weight loss in the past 2 1/2 months.    43 y.o. female  Height: Ht Readings from Last 1 Encounters:  11/01/13 5\' 3"  (1.6 m)    Weight: Wt Readings from Last 1 Encounters:  11/01/13 184 lb (83.462 kg)    Weight Hx: Wt Readings from Last 10 Encounters:  11/01/13 184 lb (83.462 kg)  10/31/13 190 lb (86.183 kg)  08/18/11 212 lb (96.163 kg)  04/27/10 239 lb (108.41 kg)    BMI:  Body mass index is 32.6 kg/(m^2). Pt meets criteria for obesity grade 1 based on current BMI.  Estimated Nutritional Needs: Kcal: 25-30 kcal/kg Protein: > 1 gram protein/kg Fluid: 1 ml/kcal  Diet Order: General Pt is also offered choice of unit snacks mid-morning and mid-afternoon.  Pt is eating as desired.   Lab results and medications reviewed.   Oran ReinLaura Emerson Schreifels, RD, LDN Clinical Inpatient Dietitian Pager:  [phone] Weekend and after hours pager:  [phone]

## 2013-11-02 NOTE — [facility] Group Notes (Signed)
[facility]   11/02/2013 12:52 PM    Participation Quality:  Appropraite  Mood/Affect:  Appropriate  Depression Rating:  9  Anxiety Rating: 9   Thoughts of Suicide:  No  Will you contract for safety?   NA  Current AVH:  No  Plan for Discharge/Comments:  Patient attended discharge planning group and actively participated in group.  [facility].  CSW provided all participants with daily workbook.   Transportation Means: Patient has transportation.   Supports:  Patient has a support system.   Elivia Robotham, Joesph JulyQuylle Hairston
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
## 2013-11-02 NOTE — Tx Team (Signed)
Interdisciplinary Treatment Plan Update   Date Reviewed:  11/02/2013  Time Reviewed:  9:58 AM  Progress in Treatment:   Attending groups: Yes Participating in groups: Yes Taking medication as prescribed: Yes  Tolerating medication: Yes Family/Significant other contact made:  No, patient declined collateral contact Patient understands diagnosis: Yes  Discussing patient identified problems/goals with staff: Yes Medical problems stabilized or resolved: Yes Denies suicidal/homicidal ideation: Yes Patient has not harmed self or others: Yes  For review of initial/current patient goals, please see plan of care.  Estimated Length of Stay:  2 days - Discharge on Sunday  Reasons for Continued Hospitalization:  Anxiety Depression Medication stabilization   New Problems/Goals identified:    Discharge Plan or Barriers:   Home with outpatient follow up with [facility]  Additional Comments:  Patient signed a 72 hour request for discharge which will expire at 5 AM on 11/05/13  Attendees:  Patient:  11/02/2013 9:58 AM   Signature: Mervyn GayJ. Jonnalagadda, MD 11/02/2013 9:58 AM  Signature:   11/02/2013 9:58 AM  Signature: 11/02/2013 9:58 AM  Signature:Beverly Terrilee CroakKnight, RN 11/02/2013 9:58 AM  Signature:  11/02/2013 9:58 AM  Signature:  Juline PatchQuylle Valisa Karpel, LCSW 11/02/2013 9:58 AM  Signature: Mordecai RasmussenHannah Coble, LCSW - Lead SW 11/02/2013 9:58 AM  Signature:  Leisa LenzValerie Enoch, Care Coordinator [facility] 11/02/2013 9:58 AM  Signature:  11/02/2013 9:58 AM  Signature: Leighton ParodyBritney Tyson, RN 11/02/2013  9:58 AM  Signature:   Onnie BoerJennifer Clark, RN [facility] 11/02/2013  9:58 AM  Signature:  Harold Barbanonecia Byrd, RN 11/02/2013  9:58 AM    Scribe for Treatment Team:   Juline PatchQuylle Jannatul Wojdyla,  11/02/2013 9:58 AM

## 2013-11-02 NOTE — Progress Notes (Signed)
Adult Psychoeducational Group Note  Date:  11/02/2013 Time:  2:33 PM  Group Topic/Focus:  Early Warning Signs:   The focus of this group is to help patients identify signs or symptoms they exhibit before slipping into an unhealthy state or crisis.  Participation Level:  Minimal  Participation Quality:  Resistant  Affect:  Appropriate  Cognitive:  Appropriate  Insight: Limited  Engagement in Group:  Engaged  Modes of Intervention:  Discussion, Socialization and Support  Additional Comments:  Pt at first did not want to participate. Pt did stated that her warning sign is loneliness and she needs to reconnect with her family for support to deal with this.  Caswell CorwinOwen, Jadis Mika C 11/02/2013, 2:33 PM

## 2013-11-03 DIAGNOSIS — F1310 Sedative, hypnotic or anxiolytic abuse, uncomplicated: Secondary | ICD-10-CM

## 2013-11-03 MED ORDER — CLONAZEPAM 0.5 MG PO TABS
0.5000 mg | ORAL_TABLET | Freq: Three times a day (TID) | ORAL | Status: DC | PRN
  Administered 2013-11-03 – 2013-11-04 (×4): 0.5 mg via ORAL
  Filled 2013-11-03 (×4): qty 1

## 2013-11-03 MED ORDER — TRAZODONE HCL 100 MG PO TABS
100.0000 mg | ORAL_TABLET | Freq: Every evening | ORAL | Status: DC | PRN
  Administered 2013-11-03: 100 mg via ORAL
  Filled 2013-11-03: qty 14
  Filled 2013-11-03: qty 1

## 2013-11-03 NOTE — Progress Notes (Signed)
Adult Psychoeducational Group Note  Date:  11/03/2013 Time:  7:21 PM  Group Topic/Focus:  love languages  Participation Level:  Active  Participation Quality:  Intrusive and Monopolizing  Affect:  Excited  Cognitive:  Appropriate  Insight: Lacking  Engagement in Group:  Distracting, Monopolizing and Off Topic  Modes of Intervention:  Discussion, Education and Support  Additional Comments:  Holly Fuentes was active in group however she was very distracting. She was off topic most of the group and was not respectful toward members and staff. She did share her love language but was not as engaged as she could have been.   Holly Fuentes, Holly Fuentes 11/03/2013, 7:21 PM

## 2013-11-03 NOTE — Progress Notes (Signed)
D) Pt states she is not sleeping well at all. Was up most of the night. States the medication did not work. States she let the techs know that she was not asleep. Rates her depression and hopelessness both at a 5. Denies SI and HI. A) Given support, reassurance and praise along with encouragement. Attending the program. R) Denies SI and HI

## 2013-11-03 NOTE — Progress Notes (Signed)
Patient ID: Holly DolphinSusan M Fuentes, female   DOB: 21-Dec-1970, 43 y.o.   MRN: [id number]  D: Patient pleasant and laughing appropriately with staff/peers. Pt sociable in milieu. A: Q 15 minute safety checks, encourage staff/peer interaction and group participation. Administer medications as ordered by MD. R: Pt denies SI and contracts for safety; no s/s of distress noted. Pt compliant with medications.

## 2013-11-03 NOTE — [facility] Group Notes (Signed)
[facility] Group Notes:  (Nursing/MHT/Case Management/Adjunct)  Date:  11/03/2013  Time:  11:10 AM  Type of Therapy:  Nurse Education  Participation Level:  Active  Participation Quality:  Appropriate  Affect:  Appropriate  Cognitive:  Alert  Insight:  Appropriate  Engagement in Group:  Engaged  Modes of Intervention:  Discussion  Summary of Progress/Problems:Pt stated her goal is not to cry today.   Rodman KeyWebb, Laurent Cargile [facility] 11/03/2013, 11:10 AM

## 2013-11-03 NOTE — [facility] Group Notes (Signed)
[facility] LCSW Group Therapy  11/03/2013 1:15 PM  Type of Therapy:  Group Therapy  Participation Level:  Minimal  Participation Quality:  Inattentive  Affect:  Blunted  Cognitive:  Alert  Insight:  Limited  Engagement in Therapy:  Limited  Modes of Intervention:  Clarification, Discussion, Exploration, Rapport Building, Socialization and Support  Summary of Progress/Problems: The main focus of today's process group was for the patient to identify ways in which they have in the past sabotaged their own recovery. Motivational Interviewing was utilized to ask the group members what they get out of their substance use, and what reasons they may have for wanting to change. The Stages of Change were explained using a handout, and patients identified where they currently are with regard to stages of change.  Patient reports she prefers to be called Renee and reports she is here for Maintenance only that other people are why I am here, I don't need to be here. Patient unwilling to elaborate more. Redirection offered at multiple points yet pt did not repond and continued side conversations.   Clide DalesHarrill, Yessica Putnam Campbell

## 2013-11-03 NOTE — Progress Notes (Signed)
[facility] MD Progress Note  11/03/2013 2:45 PM Holly Fuentes  MRN:  [id number] Subjective:  Holly Fuentes is a 43 year old female who presented to [facility] ER for suicidal ideation. She reports she is having an okay day, however she is inquiring about increasing her Klonipin dose. She states she was taking Xanax 2mg  1 tablet po QID. She states her significant other took her to [facility], and told them that she was violent and suicidal so that she could be admitted. She continue to endorse high levels of anxiety 7/10, and depression 7/10. She has noticed that things are getting better, and she can see the light. Upon discharge she plans to seek Medicaid, and attend [facility] outpatient therapy and medication management.  Diagnosis:   DSM5: Schizophrenia Disorders:   Obsessive-Compulsive Disorders:   Trauma-Stressor Disorders:  Posttraumatic Stress Disorder (309.81) and Acute Stress Disorder (308.3) Substance/Addictive Disorders:   Depressive Disorders:  Major Depressive Disorder - Severe (296.23) Total Time spent with patient: 30 minutes  Axis I: Generalized Anxiety Disorder, Major Depression, Recurrent severe, Post Traumatic Stress Disorder and Benzo abuse Axis II: Deferred Axis IV: economic problems, educational problems, housing problems, problems related to legal system/crime, problems with access to health care services and problems with primary support group Axis V: 41-50 serious symptoms  ADL's:  Intact  Sleep: Fair  Appetite:  Good  Suicidal Ideation:  Plan:  Denies Intent:  Denies Means:  Denies Homicidal Ideation:  Plan:  Denies Intent:  Denies Means:  Denies AEB (as evidenced by):  Psychiatric Specialty Exam: Physical Exam  Constitutional: She appears well-developed.  HENT:  Head: Normocephalic.  Eyes: Pupils are equal, round, and reactive to light.  Neck: Normal range of motion.  GI: Soft.  Skin: Skin is warm and dry.    Review of Systems  Psychiatric/Behavioral: Positive for depression  and suicidal ideas. The patient is nervous/anxious and has insomnia.     Blood pressure 127/82, pulse 91, temperature 98.6 F (37 C), temperature source Oral, resp. rate 16, height 5\' 3"  (1.6 m), weight 83.462 kg (184 lb), last menstrual period 10/29/2013.Body mass index is 32.6 kg/(m^2).  General Appearance: Casual and Fairly Groomed  Eye Contact::  Good  Speech:  Clear and Coherent and Normal Rate  Volume:  Normal  Mood:  Anxious, Depressed and Irritable  Affect:  Depressed and Flat  Thought Process:  Coherent and Intact  Orientation:  Full (Time, Place, and Person)  Thought Content:  WDL  Suicidal Thoughts:  No  Homicidal Thoughts:  No  Memory:  Immediate;   Good Recent;   Good Remote;   Good  Judgement:  Fair  Insight:  Good  Psychomotor Activity:  Normal  Concentration:  Good  Recall:  Fair  Fund of Knowledge:Good  Language: Fair  Akathisia:  NA  Handed:  Right  AIMS (if indicated):     Assets:  Communication Skills Desire for Improvement Physical Health  Sleep:  Number of Hours: 6.5   Musculoskeletal: Strength & Muscle Tone: within normal limits Gait & Station: normal Patient leans: N/A  Current Medications: Current Facility-Administered Medications  Medication Dose Route Frequency Provider Last Rate Last Dose  . alum & mag hydroxide-simeth (MAALOX/MYLANTA) 200-200-20 MG/5ML suspension 30 mL  30 mL Oral Q4H PRN Kristeen Mans, NP      . busPIRone (BUSPAR) tablet 7.5 mg  7.5 mg Oral BID Verne Spurr, PA-C   7.5 mg at 11/03/13 0844  . citalopram (CELEXA) tablet 20 mg  20 mg Oral Daily  Verne SpurrNeil Mashburn, PA-C   20 mg at 11/03/13 0844  . clonazePAM (KLONOPIN) tablet 0.5 mg  0.5 mg Oral BID Verne SpurrNeil Mashburn, PA-C   0.5 mg at 11/03/13 0844  . hydrOXYzine (ATARAX/VISTARIL) tablet 50 mg  50 mg Oral Q4H PRN Nehemiah SettleJanardhaha R Jonnalagadda, MD      . ibuprofen (ADVIL,MOTRIN) tablet 600 mg  600 mg Oral Q6H PRN Kristeen MansFran E Hobson, NP   600 mg at 11/01/13 2212  . magnesium hydroxide (MILK OF  MAGNESIA) suspension 30 mL  30 mL Oral Daily PRN Kristeen MansFran E Hobson, NP      . nicotine polacrilex (NICORETTE) gum 2 mg  2 mg Oral PRN Kristeen MansFran E Hobson, NP   2 mg at 11/01/13 2229  . traZODone (DESYREL) tablet 50 mg  50 mg Oral QHS PRN Kristeen MansFran E Hobson, NP   50 mg at 11/02/13 2048    Lab Results: No results found for this or any previous visit (from the past 48 hour(s)).  Physical Findings: AIMS: Facial and Oral Movements Muscles of Facial Expression: None, normal Lips and Perioral Area: None, normal Jaw: None, normal Tongue: None, normal,Extremity Movements Upper (arms, wrists, hands, fingers): None, normal Lower (legs, knees, ankles, toes): None, normal, Trunk Movements Neck, shoulders, hips: None, normal, Overall Severity Severity of abnormal movements (highest score from questions above): None, normal Incapacitation due to abnormal movements: None, normal Patient's awareness of abnormal movements (rate only patient's report): No Awareness, Dental Status Current problems with teeth and/or dentures?: No Does patient usually wear dentures?: No  CIWA:    COWS:     Treatment Plan Summary: Daily contact with patient to assess and evaluate symptoms and progress in treatment Medication management  Plan: Treatment Plan/Recommendations:  1 Admit for crisis management and stabilization. Estimated length of stay 5-7 days past her current stay of 1.  2 Individual and group therapy. 3 Medication management for depression, and anxiety to reduce current symptoms to base line and improve the overall levels of functioning: Medications reviewed with the patient and she stated no untoward effects, home medications in place.  4 Coping skills for depression and anxiety developing.  5 Continue crisis stabilization and management.  6 Address health issues- monitor vital signs, stable; 7 Treatment plan in progress to prevent relapse prevention and self care.  8 Psychosocial education regarding relapse prevention  and self care 9 Heath care follow up as needed for any health concerns 10 Call for consult with hospitalist for additional specialty patient services as needed.   Medical Decision Making Problem Points:  Established problem, worsening (2), Review of last therapy session (1) and Review of psycho-social stressors (1) Data Points:  Discuss tests with performing physician (1) Review or order clinical lab tests (1) Review or order medicine tests (1) Review of medication regiment & side effects (2) Review of new medications or change in dosage (2)  I certify that inpatient services furnished can reasonably be expected to improve the patient's condition.   Malachy ChamberSTARKES, Clent Damore S FNP-BC 11/03/2013, 2:45 PM

## 2013-11-03 NOTE — Progress Notes (Signed)
+   Adult Psychoeducational Group Note  Date:  11/03/2013 Time:  3:39 AM  Group Topic/Focus:  Wrap-Up Group:   The focus of this group is to help patients review their daily goal of treatment and discuss progress on daily workbooks.  Participation Level:  Active  Participation Quality:  Appropriate  Affect:  Appropriate  Cognitive:  Appropriate  Insight: Appropriate  Engagement in Group:  Engaged  Modes of Intervention:  Discussion  Additional Comments:    Ashtyn Freilich A 11/03/2013, 3:39 AM

## 2013-11-04 MED ORDER — CITALOPRAM HYDROBROMIDE 20 MG PO TABS: 20.0000 mg | ORAL_TABLET | Freq: Every day | ORAL | Status: DC

## 2013-11-04 MED ORDER — TRAZODONE HCL 100 MG PO TABS: 100.0000 mg | ORAL_TABLET | Freq: Every evening | ORAL | Status: DC | PRN

## 2013-11-04 MED ORDER — CLONAZEPAM 0.5 MG PO TABS: 0.5000 mg | ORAL_TABLET | Freq: Three times a day (TID) | ORAL | Status: DC | PRN

## 2013-11-04 MED ORDER — BUSPIRONE HCL 7.5 MG PO TABS: 7.5000 mg | ORAL_TABLET | Freq: Two times a day (BID) | ORAL | Status: DC

## 2013-11-04 NOTE — Progress Notes (Signed)
Psychoeducational Group Note   Date: 11/04/2013 Time: 0930 Group Topic/Focus:  Gratefulness:  The focus of this group is to help patients identify what two things they are most grateful for in their lives. What helps ground them and to center them on their work to their recovery.  Participation Level:  Active  Participation Quality:  Appropriate  Affect:  Appropriate  Cognitive:  Oriented  Insight:  Improving  Engagement in Group:  Engaged  Additional Comments:    Shalee Paolo A   

## 2013-11-04 NOTE — Progress Notes (Signed)
[facility]  Will you be returning to the same living situation after discharge: Yes,  home with family At discharge, do you have transportation home?:Yes Do you have the ability to pay for your medications:Yes  Release of information consent forms completed and in the chart;  Patient's signature needed at discharge.  Patient to Follow up at: Follow-up Information   Follow up with [facility] On 11/06/2013. (November 06, 2013 at 8:30 AM.  Referral [phone])    Contact information:   [address] [place], [place]   [id number]  [phone]      Patient denies SI/HI:   Yes,  denies both    Safety Planning and Suicide Prevention discussed:  No.Physician notified as patient refused to provide consent  Holly Fuentes, Holly Fuentes 11/04/2013, 8:29 PM

## 2013-11-04 NOTE — Progress Notes (Signed)
Writer has observed patient up in the dayroom laughing and talking with peers. She attended group this evening and participated. She reports that she has had a fabulous day and when Clinical research associatewriter asked what made her day fabulous, she reported that she is discharging on tomorrow. She currently denies si/hi/a/v hallucinations. Support and encouragement given, safety maintained on unit with 15 min checks. Patient compliant with medications.

## 2013-11-04 NOTE — [facility] Suicide Risk Assessment (Signed)
   Demographic Factors:  Caucasian and Low socioeconomic status  Total Time spent with patient: 30 minutes  Psychiatric Specialty Exam: Physical ExamAs noted in chart.  Review of Systems  Constitutional: Negative for fever, chills, weight loss and malaise/fatigue.  Respiratory: Negative for cough, hemoptysis, sputum production and shortness of breath.   Cardiovascular: Negative for chest pain, palpitations and orthopnea.  Gastrointestinal: Negative for heartburn, nausea, vomiting, abdominal pain and diarrhea.  Skin: Negative for itching and rash.  Neurological: Negative for dizziness, tingling, focal weakness, seizures and loss of consciousness.    Blood pressure 143/96, pulse 115, temperature 97.5 F (36.4 C), temperature source Oral, resp. rate 12, height 5\' 3"  (1.6 m), weight 83.462 kg (184 lb), last menstrual period 10/29/2013.Body mass index is 32.6 kg/(m^2).  General Appearance: Casual and Fairly Groomed  Eye Contact::  Good  Speech:  Clear and Coherent and Normal Rate  Volume:  Normal  Mood:  Euthymic  Affect:  Appropriate, Congruent and Full Range  Thought Process:  Coherent, Goal Directed, Linear and Logical  Orientation:  Full (Time, Place, and Person)  Thought Content:  WDL  Suicidal Thoughts:  No  Homicidal Thoughts:  No  Memory:  Immediate;   Good Recent;   Good Remote;   Good  Judgement:  Good  Insight:  Present  Psychomotor Activity:  Normal  Concentration:  Good  Recall:  Good  Fund of Knowledge:Fair  Language: Good  Akathisia:  Negative  Handed:  Right  AIMS (if indicated):   NOt indicated  Assets:  Communication Skills Desire for Improvement Financial Resources/Insurance Housing Intimacy Leisure Time Physical Health Resilience Social Support Talents/Skills Transportation Vocational/Educational  Sleep:  Number of Hours: 5.5    Musculoskeletal: Strength & Muscle Tone: within normal limits Gait & Station: normal Patient leans: N/A   Mental  Status Per Nursing Assessment::   On Admission:  NA  Current Mental Status by Physician: NA  Loss Factors: Legal issues  Historical Factors: NA  Risk Reduction Factors:   Responsible for children under 41 years of age, Living with another person, especially a relative, Positive social support and Positive coping skills or problem solving skills  Continued Clinical Symptoms:  Panic Attacks  Cognitive Features That Contribute To Risk:  Thought constriction (tunnel vision)    Suicide Risk:  Minimal: No identifiable suicidal ideation.  Patients presenting with no risk factors but with morbid ruminations; may be classified as minimal risk based on the severity of the depressive symptoms  Discharge Diagnoses:   AXIS I:  Generalized Anxiety Disorder, Major Depression, Recurrent severe, Post Traumatic Stress Disorder and Benzodiazepine abuse AXIS II:  No diagnosis AXIS III:   Past Medical History  Diagnosis Date  . PTSD (post-traumatic stress disorder)   . Anxiety   . Depression   . Hypertension    AXIS IV:  occupational problems, other psychosocial or environmental problems, problems related to legal system/crime and problems related to social environment AXIS V:  51-60 moderate symptoms  Plan Of Care/Follow-up recommendations:  Activity:  Increase as tolerated Diet:  As advised by primary care physician Tests:  NOne Other:  Follow up with outpatient on 11/06/2013, she will be prescribed medication to last until her appointment.  Is patient on multiple antipsychotic therapies at discharge:  No   Has Patient had three or more failed trials of antipsychotic monotherapy by history:  No  Recommended Plan for Multiple Antipsychotic Therapies: NA    Holly Fuentes 11/04/2013, 10:57 AM

## 2013-11-04 NOTE — Progress Notes (Signed)
Late entry on 11-04-13 for the group on 11-03-13 @ 1030  Psychoeducational Group Note  Date: 11/04/2013 Time:  1015  Group Topic/Focus:  Identifying Needs:   The focus of this group is to help patients identify their personal needs that have been historically problematic and identify healthy behaviors to address their needs.  Participation Level:  Active  Participation Quality:  Appropriate  Affect:  Appropriate  Cognitive:  Oriented  Insight:  Improving  Engagement in Group:  Engaged  Additional Comments:    Kyandra Mcclaine A  

## 2013-11-04 NOTE — Progress Notes (Signed)
Psychoeducational Group Note  Date:  11/04/2013 Time:  1015  Group Topic/Focus:  Making Healthy Choices:   The focus of this group is to help patients identify negative/unhealthy choices they were using prior to admission and identify positive/healthier coping strategies to replace them upon discharge.  Participation Level:  Active  Participation Quality:  Attentive  Affect:  Appropriate  Cognitive:  Oriented  Insight:  Improving  Engagement in Group:  Engaged  Additional Comments:    Roverto Bodmer A 11/04/2013  

## 2013-11-04 NOTE — Progress Notes (Signed)
D) Pt being discharged to home accompanied by her family. Affect and mood are appropriate. Pt denies SI and HI. Rates her depression and hopelessness both at a 3. Pt states she finally got to sleep after taking the 100 mg of Trazodone. A) All medications explained and follow up plans explained to Pt. Pt given support, reassurance and praise. All Pt's belongings returned to Pt. Pt given support and praise. R) Denies SI and HI. Is grateful for all the help she received here.
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
## 2013-11-08 NOTE — Progress Notes (Signed)
Patient Discharge Instructions:  After Visit Summary (AVS):   Faxed to:  11/08/13 Discharge Summary Note:   Faxed to:  11/08/13 Psychiatric Admission Assessment Note:   Faxed to:  11/08/13 Suicide Risk Assessment - Discharge Assessment:   Faxed to:  11/08/13 Faxed/Sent to the Next Level Care provider:  11/08/13 [facility] [phone]  Jerelene ReddenSheena E [place], 11/08/2013, 2:40 PM

## 2014-02-28 ENCOUNTER — Other Ambulatory Visit: Payer: Self-pay | Admitting: Obstetrics & Gynecology

## 2014-03-01 ENCOUNTER — Other Ambulatory Visit (HOSPITAL_COMMUNITY): Payer: Self-pay | Admitting: Internal Medicine

## 2014-03-01 DIAGNOSIS — Z1231 Encounter for screening mammogram for malignant neoplasm of breast: Secondary | ICD-10-CM

## 2014-03-06 ENCOUNTER — Ambulatory Visit (HOSPITAL_COMMUNITY)
Admission: RE | Admit: 2014-03-06 | Discharge: 2014-03-06 | Disposition: A | Discharge status: Home / Self Care | Payer: Medicaid Other | Source: Ambulatory Visit | Attending: Internal Medicine | Admitting: Internal Medicine

## 2014-03-06 DIAGNOSIS — Z1231 Encounter for screening mammogram for malignant neoplasm of breast: Secondary | ICD-10-CM | POA: Insufficient documentation

## 2014-06-17 ENCOUNTER — Encounter (HOSPITAL_COMMUNITY): Payer: Self-pay

## 2014-10-22 ENCOUNTER — Encounter (HOSPITAL_COMMUNITY): Payer: Self-pay

## 2014-10-22 ENCOUNTER — Emergency Department (HOSPITAL_COMMUNITY)
Admission: EM | Admit: 2014-10-22 | Discharge: 2014-10-22 | Disposition: A | Discharge status: Home / Self Care | Payer: Medicaid Other | Attending: Emergency Medicine | Admitting: Emergency Medicine

## 2014-10-22 DIAGNOSIS — F419 Anxiety disorder, unspecified: Secondary | ICD-10-CM | POA: Insufficient documentation

## 2014-10-22 DIAGNOSIS — F329 Major depressive disorder, single episode, unspecified: Secondary | ICD-10-CM | POA: Insufficient documentation

## 2014-10-22 DIAGNOSIS — Z9889 Other specified postprocedural states: Secondary | ICD-10-CM | POA: Insufficient documentation

## 2014-10-22 DIAGNOSIS — Z79899 Other long term (current) drug therapy: Secondary | ICD-10-CM | POA: Insufficient documentation

## 2014-10-22 DIAGNOSIS — M549 Dorsalgia, unspecified: Secondary | ICD-10-CM | POA: Diagnosis present

## 2014-10-22 DIAGNOSIS — M545 Low back pain, unspecified: Secondary | ICD-10-CM

## 2014-10-22 DIAGNOSIS — Z720 Tobacco use: Secondary | ICD-10-CM | POA: Diagnosis not present

## 2014-10-22 DIAGNOSIS — I10 Essential (primary) hypertension: Secondary | ICD-10-CM | POA: Diagnosis not present

## 2014-10-22 MED ORDER — OXYCODONE-ACETAMINOPHEN 5-325 MG PO TABS
2.0000 | ORAL_TABLET | Freq: Once | ORAL | Status: AC
  Administered 2014-10-22: 2 via ORAL
  Filled 2014-10-22: qty 2

## 2014-10-22 MED ORDER — CYCLOBENZAPRINE HCL 10 MG PO TABS
5.0000 mg | ORAL_TABLET | Freq: Once | ORAL | Status: AC
  Administered 2014-10-22: 5 mg via ORAL
  Filled 2014-10-22: qty 1

## 2014-10-22 MED ORDER — CYCLOBENZAPRINE HCL 10 MG PO TABS: 10.0000 mg | ORAL_TABLET | Freq: Two times a day (BID) | ORAL | Status: DC | PRN

## 2014-10-22 MED ORDER — IBUPROFEN 600 MG PO TABS: 600.0000 mg | ORAL_TABLET | Freq: Four times a day (QID) | ORAL | Status: DC | PRN

## 2014-10-22 MED ORDER — OXYCODONE-ACETAMINOPHEN 5-325 MG PO TABS: 1.0000 | ORAL_TABLET | Freq: Four times a day (QID) | ORAL | Status: DC | PRN

## 2014-10-22 NOTE — ED Provider Notes (Signed)
CSN: [id number]     Arrival date & time 10/22/14  1408 History  This chart was scribed for non-physician practitioner Marlon Pel, PA-C working with Blake Divine, MD by Littie Deeds, ED Scribe. This patient was seen in room WTR6/WTR6 and the patient's care was started at 3:21 PM.       Chief Complaint  Patient presents with  . Back Pain   The history is provided by the patient. No language interpreter was used.   HPI Comments: Holly Fuentes is a 44 y.o. female who presents to the Emergency Department complaining of chronic back pain that worsened 2 days ago when she picked up something heavy at the laundromat. Patient has hx of back surgery in 2008. The pain is described as pressurizing and sometimes radiates to one of her legs. She did go to work yesterday, but by the end of the day, she had difficulty ambulating due to the pain. She is able to stand up and ambulate without assistance. Patient has tried [place] powder but with minimal relief. She had been seeing a doctor in the past, and had been seen at [facility] in the past, but she had not been there for several months because she did not like the physician, he told her she was fat. She has been treating her back pain with OTC treatments since then, which she states is not very effective. She lost her insurance, but she is now on Medicaid. Patient had been on Percocet, Neurontin and a muscle relaxer in the past. She denies fever and loss of urinary and bowel incontinence.   Past Medical History  Diagnosis Date  . PTSD (post-traumatic stress disorder)   . Anxiety   . Depression   . Hypertension    Past Surgical History  Procedure Laterality Date  . Cesarean section    . Back surgery     Family History  Problem Relation Age of Onset  . Hypertension Mother   . Cancer Maternal Grandmother   . Heart disease Maternal Grandmother   . Heart disease Maternal Grandfather    History  Substance Use Topics  . Smoking  status: Current Every Day Smoker -- 0.50 packs/day    Types: Cigarettes  . Smokeless tobacco: Not on file  . Alcohol Use: No   OB History    Gravida Para Term Preterm AB TAB SAB Ectopic Multiple Living   0 2 0 2 0 0 1     Review of Systems  Musculoskeletal: Positive for back pain.  Neurological: Negative for weakness.   A complete 10 system review of systems was obtained and all systems are negative except as noted in the HPI and PMH.     Allergies  Cranberry and Hydrocodone-acetaminophen  Home Medications   Prior to Admission medications   Medication Sig Start Date End Date Taking? Authorizing Provider  busPIRone (BUSPAR) 7.5 MG tablet Take 1 tablet (7.5 mg total) by mouth 2 (two) times daily. 11/04/13   Truman Hayward, FNP  citalopram (CELEXA) 20 MG tablet Take 1 tablet (20 mg total) by mouth daily. 11/04/13   Truman Hayward, FNP  clonazePAM (KLONOPIN) 0.5 MG tablet Take 1 tablet (0.5 mg total) by mouth 3 (three) times daily as needed (anxiety). 11/04/13   Truman Hayward, FNP  cyclobenzaprine (FLEXERIL) 10 MG tablet Take 1 tablet (10 mg total) by mouth 2 (two) times daily as needed for muscle spasms. 10/22/14   Marlon Pel, PA-C  ibuprofen (  ADVIL,MOTRIN) 600 MG tablet Take 1 tablet (600 mg total) by mouth every 6 (six) hours as needed. 10/22/14   Marlon Pel, PA-C  oxyCODONE-acetaminophen (PERCOCET/ROXICET) 5-325 MG per tablet Take 1-2 tablets by mouth every 6 (six) hours as needed for severe pain. 10/22/14   Camiyah Friberg Neva Seat, PA-C  traZODone (DESYREL) 100 MG tablet Take 1 tablet (100 mg total) by mouth at bedtime as needed for sleep. 11/04/13   Truman Hayward, FNP   BP 154/70 mmHg  Pulse 82  Temp(Src) 98.3 F (36.8 C) (Oral)  Resp 16  SpO2 100% Physical Exam  Constitutional: She is oriented to person, place, and time. She appears well-developed and well-nourished. No distress.  HENT:  Head: Normocephalic and atraumatic.  Mouth/Throat: Oropharynx is clear and moist. No  oropharyngeal exudate.  Eyes: Pupils are equal, round, and reactive to light.  Neck: Neck supple.  Cardiovascular: Normal rate.   Pulmonary/Chest: Effort normal.  Musculoskeletal: She exhibits no edema.       Arms: Pt has equal strength to bilateral lower extremities.  Neurosensory function adequate to both legs No clonus on dorsiflextion Skin color is normal. Skin is warm and moist.  I see no step off deformity, no midline bony tenderness.  Pt is able to ambulate.  No crepitus, laceration, effusion, induration, lesions, swelling.   Pedal pulses are symmetrical and palpable bilaterally  Mild  tenderness to palpation of paraspinal and mindline muscles of the lumbar region which is mild.  Neurological: She is alert and oriented to person, place, and time. No cranial nerve deficit.  Skin: Skin is warm and dry. No rash noted.  Psychiatric: She has a normal mood and affect. Her behavior is normal.  Nursing note and vitals reviewed.   ED Course  Procedures  DIAGNOSTIC STUDIES: Oxygen Saturation is 100% on room air, normal by my interpretation.    COORDINATION OF CARE: 3:28 PM-Discussed treatment plan which includes pain medication, muscle relaxer and ibuprofen with pt at bedside and pt agreed to plan. Will give patient pain clinic referral.   Labs Review Labs Reviewed - No data to display  Imaging Review No results found.   EKG Interpretation None      MDM   Final diagnoses:  Midline low back pain without sciatica   44 y.o.Holly Fuentes's  with back pain. No neurological deficits and normal neuro exam. Patient can walk. No loss of bowel or bladder control. No concern for cauda equina at this time base on HPI and physical exam findings. No fever, night sweats, weight loss, h/o cancer, IVDU.   RICE protocol and pain medicine indicated and discussed with patient.   Patient Plan 1. Medications: pain medication and muscle relaxer. Cont usual home medications unless otherwise  directed. 2. Treatment: rest, drink plenty of fluids, gentle stretching as discussed, alternate ice and heat  3. Follow Up: Please followup with your primary doctor for discussion of your diagnoses and further evaluation after today's visit; if you do not have a primary care doctor use the resource guide provided to find one  Advised to follow-up with the orthopedist if symptoms do not start to resolve in the next 2-3 days. If develop loss of bowel or urinary control return to the ED as soon as possible for further evaluation. To take the medications as prescribed as they can cause harm if not taken appropriately.   Vital signs are stable at discharge. Filed Vitals:   10/22/14 1418  BP: 154/70  Pulse: 82  Temp: 98.3  F (36.8 C)  Resp: 16    Patient/guardian has voiced understanding and agreed to follow-up with the PCP or specialist.       I personally performed the services described in this documentation, which was scribed in my presence. The recorded information has been reviewed and is accurate.    Marlon Peliffany Sarkis Rhines, PA-C 10/22/14 1543  Blake DivineJohn Wofford, MD 10/22/14 [phone]

## 2014-10-22 NOTE — ED Notes (Signed)
Pt c/o chronic back pain increasing x 2 days.  Pain score 8/10.  Pt has not taken anything but a single [employer] powder for pain.  Hx of back surgery in 2008.

## 2015-09-27 ENCOUNTER — Emergency Department (HOSPITAL_COMMUNITY)
Admission: EM | Admit: 2015-09-27 | Discharge: 2015-09-27 | Disposition: A | Discharge status: Home / Self Care | Payer: Medicaid Other | Attending: Emergency Medicine | Admitting: Emergency Medicine

## 2015-09-27 ENCOUNTER — Encounter (HOSPITAL_COMMUNITY): Payer: Self-pay | Admitting: Family Medicine

## 2015-09-27 DIAGNOSIS — Z79899 Other long term (current) drug therapy: Secondary | ICD-10-CM | POA: Insufficient documentation

## 2015-09-27 DIAGNOSIS — F17210 Nicotine dependence, cigarettes, uncomplicated: Secondary | ICD-10-CM | POA: Diagnosis not present

## 2015-09-27 DIAGNOSIS — F329 Major depressive disorder, single episode, unspecified: Secondary | ICD-10-CM | POA: Diagnosis not present

## 2015-09-27 DIAGNOSIS — F419 Anxiety disorder, unspecified: Secondary | ICD-10-CM | POA: Diagnosis not present

## 2015-09-27 DIAGNOSIS — H7291 Unspecified perforation of tympanic membrane, right ear: Secondary | ICD-10-CM

## 2015-09-27 DIAGNOSIS — H6591 Unspecified nonsuppurative otitis media, right ear: Secondary | ICD-10-CM | POA: Insufficient documentation

## 2015-09-27 DIAGNOSIS — I10 Essential (primary) hypertension: Secondary | ICD-10-CM | POA: Insufficient documentation

## 2015-09-27 DIAGNOSIS — H66013 Acute suppurative otitis media with spontaneous rupture of ear drum, bilateral: Secondary | ICD-10-CM | POA: Diagnosis not present

## 2015-09-27 DIAGNOSIS — H9203 Otalgia, bilateral: Secondary | ICD-10-CM | POA: Diagnosis present

## 2015-09-27 MED ORDER — IBUPROFEN 400 MG PO TABS
800.0000 mg | ORAL_TABLET | Freq: Once | ORAL | Status: AC
  Administered 2015-09-27: 800 mg via ORAL
  Filled 2015-09-27: qty 2

## 2015-09-27 MED ORDER — AMOXICILLIN 500 MG PO CAPS: 500.0000 mg | ORAL_CAPSULE | Freq: Three times a day (TID) | ORAL | Status: DC

## 2015-09-27 MED ORDER — AMOXICILLIN 500 MG PO CAPS
500.0000 mg | ORAL_CAPSULE | Freq: Once | ORAL | Status: AC
Start: 2015-09-27 — End: 2015-09-27
  Administered 2015-09-27: 500 mg via ORAL
  Filled 2015-09-27: qty 1

## 2015-09-27 NOTE — ED Provider Notes (Signed)
CSN: [id number]     Arrival date & time 09/27/15  1235 History  By signing my name below, I, Freida Busman, attest that this documentation has been prepared under the direction and in the presence of non-physician practitioner, Dierdre Forth, PA-C. Electronically Signed: Freida Busman, Scribe. 09/27/2015. 1:34 PM.     Chief Complaint  Patient presents with  . Otalgia    The history is provided by the patient. No language interpreter was used.    HPI Comments:  Holly Fuentes is a 45 y.o. female who presents to the Emergency Department complaining of moderate  bilateral ear pain that began yesterday. Pt states she woke up with right ear pain, notes increased pressure throughout the day. Yesterday evening the ear began to bleed and her pain the right ear improved. She states the pain in her left ear then began . She has applied heating pad and taken BC powder with little relief.  Pt notes associated cough 3 days ago. She denies recent nasal congestion, fever and rhinorrhea.   Past Medical History  Diagnosis Date  . PTSD (post-traumatic stress disorder)   . Anxiety   . Depression   . Hypertension    Past Surgical History  Procedure Laterality Date  . Cesarean section    . Back surgery     Family History  Problem Relation Age of Onset  . Hypertension Mother   . Cancer Maternal Grandmother   . Heart disease Maternal Grandmother   . Heart disease Maternal Grandfather    Social History  Substance Use Topics  . Smoking status: Current Every Day Smoker -- 0.50 packs/day    Types: Cigarettes  . Smokeless tobacco: None  . Alcohol Use: No   OB History    Gravida Para Term Preterm AB TAB SAB Ectopic Multiple Living   0 2 0 2 0 0 1     Review of Systems  Constitutional: Negative for fever and chills.  HENT: Positive for ear discharge (blood) and ear pain. Negative for congestion.   Respiratory: Negative for shortness of breath.   Cardiovascular: Negative for chest  pain.  Gastrointestinal: Negative for vomiting and diarrhea.    Allergies  Cranberry and Hydrocodone-acetaminophen  Home Medications   Prior to Admission medications   Medication Sig Start Date End Date Taking? Authorizing Provider  amoxicillin (AMOXIL) 500 MG capsule Take 1 capsule (500 mg total) by mouth 3 (three) times daily. 09/27/15   Rilley Stash, PA-C  busPIRone (BUSPAR) 7.5 MG tablet Take 1 tablet (7.5 mg total) by mouth 2 (two) times daily. 11/04/13   Truman Hayward, FNP  citalopram (CELEXA) 20 MG tablet Take 1 tablet (20 mg total) by mouth daily. 11/04/13   Truman Hayward, FNP  clonazePAM (KLONOPIN) 0.5 MG tablet Take 1 tablet (0.5 mg total) by mouth 3 (three) times daily as needed (anxiety). 11/04/13   Truman Hayward, FNP  cyclobenzaprine (FLEXERIL) 10 MG tablet Take 1 tablet (10 mg total) by mouth 2 (two) times daily as needed for muscle spasms. 10/22/14   Tiffany Neva Seat, PA-C  ibuprofen (ADVIL,MOTRIN) 600 MG tablet Take 1 tablet (600 mg total) by mouth every 6 (six) hours as needed. 10/22/14   Marlon Pel, PA-C  oxyCODONE-acetaminophen (PERCOCET/ROXICET) 5-325 MG per tablet Take 1-2 tablets by mouth every 6 (six) hours as needed for severe pain. 10/22/14   Tiffany Neva Seat, PA-C  traZODone (DESYREL) 100 MG tablet Take 1 tablet (100 mg total) by mouth at bedtime as needed  for sleep. 11/04/13   Truman Hayward, FNP   BP 127/68 mmHg  Pulse 93  Temp(Src) 98.4 F (36.9 C) (Oral)  Resp 18  SpO2 97% Physical Exam  Constitutional: She is oriented to person, place, and time. She appears well-developed and well-nourished. No distress.  HENT:  Head: Normocephalic and atraumatic.  Right Ear: External ear and ear canal normal. Tympanic membrane is perforated and erythematous.  Left Ear: External ear and ear canal normal. Tympanic membrane is erythematous and bulging.  Nose: No epistaxis. Right sinus exhibits no maxillary sinus tenderness and no frontal sinus tenderness. Left sinus  exhibits no maxillary sinus tenderness and no frontal sinus tenderness.  Mouth/Throat: Uvula is midline and mucous membranes are normal. Mucous membranes are not pale and not cyanotic. No oropharyngeal exudate, posterior oropharyngeal edema, posterior oropharyngeal erythema or tonsillar abscesses.  Right TM: perforated at 2 o'clock position with erythema and purulent effusion Left TM: bulging, erythema, and effusion   Eyes: Conjunctivae are normal. Pupils are equal, round, and reactive to light.  Neck: Normal range of motion and full passive range of motion without pain.  Cardiovascular: Normal rate and intact distal pulses.   Pulmonary/Chest: Effort normal and breath sounds normal. No stridor.  Clear and equal breath sounds without focal wheezes, rhonchi, rales  Abdominal: Soft. Bowel sounds are normal. There is no tenderness.  Musculoskeletal: Normal range of motion.  Lymphadenopathy:    She has no cervical adenopathy.  Neurological: She is alert and oriented to person, place, and time.  Skin: Skin is warm and dry. No rash noted. She is not diaphoretic.  Psychiatric: She has a normal mood and affect.  Nursing note and vitals reviewed.   ED Course  Procedures   DIAGNOSTIC STUDIES:  Oxygen Saturation is 97% on RA, normal by my interpretation.    COORDINATION OF CARE:  1:23 PM Will discharge with abx.  Discussed treatment plan with pt at bedside and pt agreed to plan.   MDM   Final diagnoses:  Acute suppurative otitis media of both ears with spontaneous rupture of tympanic membranes, recurrence not specified  Otitis media, serous, TM rupture, right   Patient presents with otalgia and exam consistent with acute otitis media. No concern for acute mastoiditis, meningitis.  TM perforation of the right ear.  Patient discharged home with amoxicillin.  Advised patient to follow-up with PCP.  I have also discussed reasons to return immediately to the ER.  Patient expresses understanding  and agrees with plan. Pt appears safe for discharge.  I personally performed the services described in this documentation, which was scribed in my presence. The recorded information has been reviewed and is accurate.    Dahlia Client Kylia Grajales, PA-C 09/27/15 1334  Margarita Grizzle, MD 09/28/15 [phone]

## 2015-09-27 NOTE — ED Notes (Signed)
Declined W/C at D/C and was escorted to lobby by RN. 

## 2015-09-27 NOTE — ED Notes (Signed)
Pt here for bilateral ear pain worse on the left with slight bleeding from right ear.

## 2015-09-27 NOTE — Discharge Instructions (Signed)
1. Medications: Amoxicillin, usual home medications 2. Treatment: rest, drink plenty of fluids,  3. Follow Up: Please followup with your primary doctor in 2-3 days for discussion of your diagnoses and further evaluation after today's visit; if you do not have a primary care doctor use the resource guide provided to find one; Please return to the ER for worsening symptoms, high fevers or other concerns   Eardrum Perforation An eardrum perforation is a puncture or tear in the eardrum. This is also called a ruptured eardrum. The eardrum is a thin, round tissue inside of your ear that separates your ear canal from your middle ear. This is the tissue that detects sound and enables you to hear. An eardrum perforation can cause discomfort and hearing loss. In most cases, the eardrum will heal without treatment and with little or no permanent hearing loss. CAUSES An eardrum perforation can result from different causes, including:  Sudden pressure changes that happen in situations such as scuba diving or flying in an airplane.  Foreign objects in the ear.  Inserting a cotton-tipped swab or any blunt object into the ear.  Loud noise.  Trauma to the ear.  Attempting to remove an object from the ear. SIGNS AND SYMPTOMS  Hearing loss.  Ear pain.  Ringing in the ear.  Discharge or bleeding from the ear.  Dizziness.  Vomiting.  Facial paralysis. DIAGNOSIS  Your health care provider will examine your ear using a tool called an otoscope. This tool allows the health care provider to see into your ear to examine your eardrum. Various types of hearing tests may also be done. TREATMENT  Typically, the eardrum will heal on its own within a few weeks. If your eardrum does not heal, your health care provider may recommend one of the following treatments:  A procedure to place a patch over your eardrum.  Surgery to repair your eardrum. HOME CARE INSTRUCTIONS   Keep your ear dry. This will  improve healing. Do not submerge your head under water until healing is complete. Do not swim or dive until your health care provider approves. While bathing or showering, protect your ear using one of these methods:  Using a waterproof earplug.  Covering a piece of cotton with petroleum jelly and placing it in your outer ear canal.  Take medicines only as directed by your health care provider.  Avoid blowing your nose if possible. If you blow your nose, do it gently. Forceful blowing increases the pressure in your middle ear. This may cause further injury or may delay your healing.  Resume your normal activities after the perforation has healed. Your health care provider can tell you when this has occurred.  Talk to your health care provider before you fly on an airplane. Air travel is generally allowed with a perforated eardrum.  Keep all follow-up visits as directed by your health care provider. This is important. SEEK MEDICAL CARE IF:  You have a fever. SEEK IMMEDIATE MEDICAL CARE IF:  You have blood or pus coming from your ear.  You have dizziness or problems with balance.  You have nausea or vomiting.  You have increased pain.   This information is not intended to replace advice given to you by your health care provider. Make sure you discuss any questions you have with your health care provider.   Document Released: 07/30/2000 Document Revised: 08/23/2014 Document Reviewed: 03/11/2014 Elsevier Interactive Patient Education 2016 [employer]    Otitis Media With Effusion Otitis media with  effusion is the presence of fluid in the middle ear. This is a common problem in children, which often follows ear infections. It may be present for weeks or longer after the infection. Unlike an acute ear infection, otitis media with effusion refers only to fluid behind the ear drum and not infection. Children with repeated ear and sinus infections and allergy problems are the most  likely to get otitis media with effusion. CAUSES  The most frequent cause of the fluid buildup is dysfunction of the eustachian tubes. These are the tubes that drain fluid in the ears to the back of the nose (nasopharynx). SYMPTOMS   The main symptom of this condition is hearing loss. As a result, you or your child may:  Listen to the TV at a loud volume.  Not respond to questions.  Ask "what" often when spoken to.  Mistake or confuse one sound or word for another.  There may be a sensation of fullness or pressure but usually not pain. DIAGNOSIS   Your health care provider will diagnose this condition by examining you or your child's ears.  Your health care provider may test the pressure in you or your child's ear with a tympanometer.  A hearing test may be conducted if the problem persists. TREATMENT   Treatment depends on the duration and the effects of the effusion.  Antibiotics, decongestants, nose drops, and cortisone-type drugs (tablets or nasal spray) may not be helpful.  Children with persistent ear effusions may have delayed language or behavioral problems. Children at risk for developmental delays in hearing, learning, and speech may require referral to a specialist earlier than children not at risk.  You or your child's health care provider may suggest a referral to an ear, nose, and throat surgeon for treatment. The following may help restore normal hearing:  Drainage of fluid.  Placement of ear tubes (tympanostomy tubes).  Removal of adenoids (adenoidectomy). HOME CARE INSTRUCTIONS   Avoid secondhand smoke.  Infants who are breastfed are less likely to have this condition.  Avoid feeding infants while they are lying flat.  Avoid known environmental allergens.  Avoid people who are sick. SEEK MEDICAL CARE IF:   Hearing is not better in 3 months.  Hearing is worse.  Ear pain.  Drainage from the ear.  Dizziness. MAKE SURE YOU:   Understand these  instructions.  Will watch your condition.  Will get help right away if you are not doing well or get worse.   This information is not intended to replace advice given to you by your health care provider. Make sure you discuss any questions you have with your health care provider.   Document Released: 09/09/2004 Document Revised: 08/23/2014 Document Reviewed: 02/27/2013 Elsevier Interactive Patient Education [employer]

## 2016-02-01 ENCOUNTER — Emergency Department (HOSPITAL_COMMUNITY)
Admission: EM | Admit: 2016-02-01 | Discharge: 2016-02-01 | Disposition: A | Discharge status: Home / Self Care | Payer: No Typology Code available for payment source | Attending: Emergency Medicine | Admitting: Emergency Medicine

## 2016-02-01 ENCOUNTER — Encounter (HOSPITAL_COMMUNITY): Payer: Self-pay | Admitting: Emergency Medicine

## 2016-02-01 DIAGNOSIS — Y92410 Unspecified street and highway as the place of occurrence of the external cause: Secondary | ICD-10-CM | POA: Diagnosis not present

## 2016-02-01 DIAGNOSIS — M542 Cervicalgia: Secondary | ICD-10-CM

## 2016-02-01 DIAGNOSIS — Y9389 Activity, other specified: Secondary | ICD-10-CM | POA: Diagnosis not present

## 2016-02-01 DIAGNOSIS — Z791 Long term (current) use of non-steroidal anti-inflammatories (NSAID): Secondary | ICD-10-CM | POA: Diagnosis not present

## 2016-02-01 DIAGNOSIS — M25512 Pain in left shoulder: Secondary | ICD-10-CM | POA: Diagnosis present

## 2016-02-01 DIAGNOSIS — Z79899 Other long term (current) drug therapy: Secondary | ICD-10-CM | POA: Insufficient documentation

## 2016-02-01 DIAGNOSIS — Y999 Unspecified external cause status: Secondary | ICD-10-CM | POA: Insufficient documentation

## 2016-02-01 DIAGNOSIS — F329 Major depressive disorder, single episode, unspecified: Secondary | ICD-10-CM | POA: Insufficient documentation

## 2016-02-01 DIAGNOSIS — F17210 Nicotine dependence, cigarettes, uncomplicated: Secondary | ICD-10-CM | POA: Diagnosis not present

## 2016-02-01 MED ORDER — METHOCARBAMOL 500 MG PO TABS
500.0000 mg | ORAL_TABLET | Freq: Two times a day (BID) | ORAL | Status: DC
Start: 2016-02-01 — End: 2021-03-20

## 2016-02-01 MED ORDER — IBUPROFEN 800 MG PO TABS: 800.0000 mg | ORAL_TABLET | Freq: Three times a day (TID) | ORAL | Status: DC

## 2016-02-01 MED ORDER — IBUPROFEN 800 MG PO TABS
800.0000 mg | ORAL_TABLET | Freq: Once | ORAL | Status: DC
  Filled 2016-02-01: qty 1

## 2016-02-01 NOTE — Discharge Instructions (Signed)
°Cervical Strain and Sprain With Rehab °Cervical strain and sprain are injuries that commonly occur with "whiplash" injuries. Whiplash occurs when the neck is forcefully whipped backward or forward, such as during a motor vehicle accident or during contact sports. The muscles, ligaments, tendons, discs, and nerves of the neck are susceptible to injury when this occurs. °RISK FACTORS °Risk of having a whiplash injury increases if: °· Osteoarthritis of the spine. °· Situations that make head or neck accidents or trauma more likely. °· High-risk sports (football, rugby, wrestling, hockey, auto racing, gymnastics, diving, contact karate, or boxing). °· Poor strength and flexibility of the neck. °· Previous neck injury. °· Poor tackling technique. °· Improperly fitted or padded equipment. °SYMPTOMS  °· Pain or stiffness in the front or back of neck or both. °· Symptoms may present immediately or up to 24 hours after injury. °· Dizziness, headache, nausea, and vomiting. °· Muscle spasm with soreness and stiffness in the neck. °· Tenderness and swelling at the injury site. °PREVENTION °· Learn and use proper technique (avoid tackling with the head, spearing, and head-butting; use proper falling techniques to avoid landing on the head). °· Warm up and stretch properly before activity. °· Maintain physical fitness: °¨ Strength, flexibility, and endurance. °¨ Cardiovascular fitness. °· Wear properly fitted and padded protective equipment, such as padded soft collars, for participation in contact sports. °PROGNOSIS  °Recovery from cervical strain and sprain injuries is dependent on the extent of the injury. These injuries are usually curable in 1 week to 3 months with appropriate treatment.  °RELATED COMPLICATIONS  °· Temporary numbness and weakness may occur if the nerve roots are damaged, and this may persist until the nerve has completely healed. °· Chronic pain due to frequent recurrence of symptoms. °· Prolonged healing,  especially if activity is resumed too soon (before complete recovery). °TREATMENT  °Treatment initially involves the use of ice and medication to help reduce pain and inflammation. It is also important to perform strengthening and stretching exercises and modify activities that worsen symptoms so the injury does not get worse. These exercises may be performed at home or with a therapist. For patients who experience severe symptoms, a soft, padded collar may be recommended to be worn around the neck.  °Improving your posture may help reduce symptoms. Posture improvement includes pulling your chin and abdomen in while sitting or standing. If you are sitting, sit in a firm chair with your buttocks against the back of the chair. While sleeping, try replacing your pillow with a small towel rolled to 2 inches in diameter, or use a cervical pillow or soft cervical collar. Poor sleeping positions delay healing.  °For patients with nerve root damage, which causes numbness or weakness, the use of a cervical traction apparatus may be recommended. Surgery is rarely necessary for these injuries. However, cervical strain and sprains that are present at birth (congenital) may require surgery. °MEDICATION  °· If pain medication is necessary, nonsteroidal anti-inflammatory medications, such as aspirin and ibuprofen, or other minor pain relievers, such as acetaminophen, are often recommended. °· Do not take pain medication for 7 days before surgery. °· Prescription pain relievers may be given if deemed necessary by your caregiver. Use only as directed and only as much as you need. °HEAT AND COLD:  °· Cold treatment (icing) relieves pain and reduces inflammation. Cold treatment should be applied for 10 to 15 minutes every 2 to 3 hours for inflammation and pain and immediately after any activity that aggravates your   symptoms. Use ice packs or an ice massage. °· Heat treatment may be used prior to performing the stretching and  strengthening activities prescribed by your caregiver, physical therapist, or athletic trainer. Use a heat pack or a warm soak. °SEEK MEDICAL CARE IF:  °· Symptoms get worse or do not improve in 2 weeks despite treatment. °· New, unexplained symptoms develop (drugs used in treatment may produce side effects). °EXERCISES °RANGE OF MOTION (ROM) AND STRETCHING EXERCISES - Cervical Strain and Sprain °These exercises may help you when beginning to rehabilitate your injury. In order to successfully resolve your symptoms, you must improve your posture. These exercises are designed to help reduce the forward-head and rounded-shoulder posture which contributes to this condition. Your symptoms may resolve with or without further involvement from your physician, physical therapist or athletic trainer. While completing these exercises, remember:  °· Restoring tissue flexibility helps normal motion to return to the joints. This allows healthier, less painful movement and activity. °· An effective stretch should be held for at least 20 seconds, although you may need to begin with shorter hold times for comfort. °· A stretch should never be painful. You should only feel a gentle lengthening or release in the stretched tissue. °STRETCH- Axial Extensors °· Lie on your back on the floor. You may bend your knees for comfort. Place a rolled-up hand towel or dish towel, about 2 inches in diameter, under the part of your head that makes contact with the floor. °· Gently tuck your chin, as if trying to make a "double chin," until you feel a gentle stretch at the base of your head. °· Hold __________ seconds. °Repeat __________ times. Complete this exercise __________ times per day.  °STRETCH - Axial Extension  °· Stand or sit on a firm surface. Assume a good posture: chest up, shoulders drawn back, abdominal muscles slightly tense, knees unlocked (if standing) and feet hip width apart. °· Slowly retract your chin so your head slides back  and your chin slightly lowers. Continue to look straight ahead. °· You should feel a gentle stretch in the back of your head. Be certain not to feel an aggressive stretch since this can cause headaches later. °· Hold for __________ seconds. °Repeat __________ times. Complete this exercise __________ times per day. °STRETCH - Cervical Side Bend  °· Stand or sit on a firm surface. Assume a good posture: chest up, shoulders drawn back, abdominal muscles slightly tense, knees unlocked (if standing) and feet hip width apart. °· Without letting your nose or shoulders move, slowly tip your right / left ear to your shoulder until your feel a gentle stretch in the muscles on the opposite side of your neck. °· Hold __________ seconds. °Repeat __________ times. Complete this exercise __________ times per day. °STRETCH - Cervical Rotators  °· Stand or sit on a firm surface. Assume a good posture: chest up, shoulders drawn back, abdominal muscles slightly tense, knees unlocked (if standing) and feet hip width apart. °· Keeping your eyes level with the ground, slowly turn your head until you feel a gentle stretch along the back and opposite side of your neck. °· Hold __________ seconds. °Repeat __________ times. Complete this exercise __________ times per day. °RANGE OF MOTION - Neck Circles  °· Stand or sit on a firm surface. Assume a good posture: chest up, shoulders drawn back, abdominal muscles slightly tense, knees unlocked (if standing) and feet hip width apart. °· Gently roll your head down and around from the back   of one shoulder to the back of the other. The motion should never be forced or painful. °· Repeat the motion 10-20 times, or until you feel the neck muscles relax and loosen. °Repeat __________ times. Complete the exercise __________ times per day. °STRENGTHENING EXERCISES - Cervical Strain and Sprain °These exercises may help you when beginning to rehabilitate your injury. They may resolve your symptoms with or  without further involvement from your physician, physical therapist, or athletic trainer. While completing these exercises, remember:  °· Muscles can gain both the endurance and the strength needed for everyday activities through controlled exercises. °· Complete these exercises as instructed by your physician, physical therapist, or athletic trainer. Progress the resistance and repetitions only as guided. °· You may experience muscle soreness or fatigue, but the pain or discomfort you are trying to eliminate should never worsen during these exercises. If this pain does worsen, stop and make certain you are following the directions exactly. If the pain is still present after adjustments, discontinue the exercise until you can discuss the trouble with your clinician. °STRENGTH - Cervical Flexors, Isometric °· Face a wall, standing about 6 inches away. Place a small pillow, a ball about 6-8 inches in diameter, or a folded towel between your forehead and the wall. °· Slightly tuck your chin and gently push your forehead into the soft object. Push only with mild to moderate intensity, building up tension gradually. Keep your jaw and forehead relaxed. °· Hold 10 to 20 seconds. Keep your breathing relaxed. °· Release the tension slowly. Relax your neck muscles completely before you start the next repetition. °Repeat __________ times. Complete this exercise __________ times per day. °STRENGTH- Cervical Lateral Flexors, Isometric  °· Stand about 6 inches away from a wall. Place a small pillow, a ball about 6-8 inches in diameter, or a folded towel between the side of your head and the wall. °· Slightly tuck your chin and gently tilt your head into the soft object. Push only with mild to moderate intensity, building up tension gradually. Keep your jaw and forehead relaxed. °· Hold 10 to 20 seconds. Keep your breathing relaxed. °· Release the tension slowly. Relax your neck muscles completely before you start the next  repetition. °Repeat __________ times. Complete this exercise __________ times per day. °STRENGTH - Cervical Extensors, Isometric  °· Stand about 6 inches away from a wall. Place a small pillow, a ball about 6-8 inches in diameter, or a folded towel between the back of your head and the wall. °· Slightly tuck your chin and gently tilt your head back into the soft object. Push only with mild to moderate intensity, building up tension gradually. Keep your jaw and forehead relaxed. °· Hold 10 to 20 seconds. Keep your breathing relaxed. °· Release the tension slowly. Relax your neck muscles completely before you start the next repetition. °Repeat __________ times. Complete this exercise __________ times per day. °POSTURE AND BODY MECHANICS CONSIDERATIONS - Cervical Strain and Sprain °Keeping correct posture when sitting, standing or completing your activities will reduce the stress put on different body tissues, allowing injured tissues a chance to heal and limiting painful experiences. The following are general guidelines for improved posture. Your physician or physical therapist will provide you with any instructions specific to your needs. While reading these guidelines, remember: °· The exercises prescribed by your provider will help you have the flexibility and strength to maintain correct postures. °· The correct posture provides the optimal environment for your joints to work.   All of your joints have less wear and tear when properly supported by a spine with good posture. This means you will experience a healthier, less painful body. °· Correct posture must be practiced with all of your activities, especially prolonged sitting and standing. Correct posture is as important when doing repetitive low-stress activities (typing) as it is when doing a single heavy-load activity (lifting). °PROLONGED STANDING WHILE SLIGHTLY LEANING FORWARD °When completing a task that requires you to lean forward while standing in one  place for a long time, place either foot up on a stationary 2- to 4-inch high object to help maintain the best posture. When both feet are on the ground, the low back tends to lose its slight inward curve. If this curve flattens (or becomes too large), then the back and your other joints will experience too much stress, fatigue more quickly, and can cause pain.  °RESTING POSITIONS °Consider which positions are most painful for you when choosing a resting position. If you have pain with flexion-based activities (sitting, bending, stooping, squatting), choose a position that allows you to rest in a less flexed posture. You would want to avoid curling into a fetal position on your side. If your pain worsens with extension-based activities (prolonged standing, working overhead), avoid resting in an extended position such as sleeping on your stomach. Most people will find more comfort when they rest with their spine in a more neutral position, neither too rounded nor too arched. Lying on a non-sagging bed on your side with a pillow between your knees, or on your back with a pillow under your knees will often provide some relief. Keep in mind, being in any one position for a prolonged period of time, no matter how correct your posture, can still lead to stiffness. °WALKING °Walk with an upright posture. Your ears, shoulders, and hips should all line up. °OFFICE WORK °When working at a desk, create an environment that supports good, upright posture. Without extra support, muscles fatigue and lead to excessive strain on joints and other tissues. °CHAIR: °· A chair should be able to slide under your desk when your back makes contact with the back of the chair. This allows you to work closely. °· The chair's height should allow your eyes to be level with the upper part of your monitor and your hands to be slightly lower than your elbows. °· Body position: °¨ Your feet should make contact with the floor. If this is not  possible, use a foot rest. °¨ Keep your ears over your shoulders. This will reduce stress on your neck and low back. °  °This information is not intended to replace advice given to you by your health care provider. Make sure you discuss any questions you have with your health care provider. °  °Document Released: 08/02/2005 Document Revised: 08/23/2014 Document Reviewed: 11/14/2008 °[employer] Interactive Patient Education ©2016 [employer] °Motor Vehicle Collision °It is common to have multiple bruises and sore muscles after a motor vehicle collision (MVC). These tend to feel worse for the first 24 hours. You may have the most stiffness and soreness over the first several hours. You may also feel worse when you wake up the first morning after your collision. After this point, you will usually begin to improve with each day. The speed of improvement often depends on the severity of the collision, the number of injuries, and the location and nature of these injuries. °HOME CARE INSTRUCTIONS °· Put ice on the injured area. °·   Put ice in a plastic bag. °· Place a towel between your skin and the bag. °· Leave the ice on for 15-20 minutes, 3-4 times a day, or as directed by your health care provider. °· Drink enough fluids to keep your urine clear or pale yellow. Do not drink alcohol. °· Take a warm shower or bath once or twice a day. This will increase blood flow to sore muscles. °· You may return to activities as directed by your caregiver. Be careful when lifting, as this may aggravate neck or back pain. °· Only take over-the-counter or prescription medicines for pain, discomfort, or fever as directed by your caregiver. Do not use aspirin. This may increase bruising and bleeding. °SEEK IMMEDIATE MEDICAL CARE IF: °· You have numbness, tingling, or weakness in the arms or legs. °· You develop severe headaches not relieved with medicine. °· You have severe neck pain, especially tenderness in the middle of the back of  your neck. °· You have changes in bowel or bladder control. °· There is increasing pain in any area of the body. °· You have shortness of breath, light-headedness, dizziness, or fainting. °· You have chest pain. °· You feel sick to your stomach (nauseous), throw up (vomit), or sweat. °· You have increasing abdominal discomfort. °· There is blood in your urine, stool, or vomit. °· You have pain in your shoulder (shoulder strap areas). °· You feel your symptoms are getting worse. °MAKE SURE YOU: °· Understand these instructions. °· Will watch your condition. °· Will get help right away if you are not doing well or get worse. °  °This information is not intended to replace advice given to you by your health care provider. Make sure you discuss any questions you have with your health care provider. °  °Document Released: 08/02/2005 Document Revised: 08/23/2014 Document Reviewed: 12/30/2010 °[employer] Interactive Patient Education ©2016 [employer] ° °
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
## 2021-03-15 ENCOUNTER — Encounter (HOSPITAL_COMMUNITY): Payer: Self-pay | Admitting: Emergency Medicine

## 2021-03-15 ENCOUNTER — Other Ambulatory Visit: Payer: Self-pay

## 2021-03-15 ENCOUNTER — Emergency Department (HOSPITAL_COMMUNITY): Payer: Self-pay

## 2021-03-15 ENCOUNTER — Inpatient Hospital Stay (HOSPITAL_COMMUNITY)
Admission: EM | Admit: 2021-03-15 | Discharge: 2021-03-20 | DRG: 190 | Disposition: A | Discharge status: Home / Self Care | Payer: Self-pay | Attending: Internal Medicine | Admitting: Internal Medicine

## 2021-03-15 DIAGNOSIS — Z7951 Long term (current) use of inhaled steroids: Secondary | ICD-10-CM

## 2021-03-15 DIAGNOSIS — F419 Anxiety disorder, unspecified: Secondary | ICD-10-CM

## 2021-03-15 DIAGNOSIS — J209 Acute bronchitis, unspecified: Secondary | ICD-10-CM | POA: Diagnosis present

## 2021-03-15 DIAGNOSIS — I429 Cardiomyopathy, unspecified: Secondary | ICD-10-CM | POA: Diagnosis present

## 2021-03-15 DIAGNOSIS — J40 Bronchitis, not specified as acute or chronic: Secondary | ICD-10-CM

## 2021-03-15 DIAGNOSIS — Z7952 Long term (current) use of systemic steroids: Secondary | ICD-10-CM

## 2021-03-15 DIAGNOSIS — E278 Other specified disorders of adrenal gland: Secondary | ICD-10-CM | POA: Diagnosis present

## 2021-03-15 DIAGNOSIS — E860 Dehydration: Secondary | ICD-10-CM | POA: Diagnosis present

## 2021-03-15 DIAGNOSIS — W19XXXA Unspecified fall, initial encounter: Secondary | ICD-10-CM

## 2021-03-15 DIAGNOSIS — F32A Depression, unspecified: Secondary | ICD-10-CM | POA: Diagnosis present

## 2021-03-15 DIAGNOSIS — Z6838 Body mass index (BMI) 38.0-38.9, adult: Secondary | ICD-10-CM

## 2021-03-15 DIAGNOSIS — E669 Obesity, unspecified: Secondary | ICD-10-CM | POA: Diagnosis present

## 2021-03-15 DIAGNOSIS — E1165 Type 2 diabetes mellitus with hyperglycemia: Secondary | ICD-10-CM | POA: Diagnosis present

## 2021-03-15 DIAGNOSIS — F4310 Post-traumatic stress disorder, unspecified: Secondary | ICD-10-CM | POA: Diagnosis present

## 2021-03-15 DIAGNOSIS — J441 Chronic obstructive pulmonary disease with (acute) exacerbation: Principal | ICD-10-CM | POA: Diagnosis present

## 2021-03-15 DIAGNOSIS — F17210 Nicotine dependence, cigarettes, uncomplicated: Secondary | ICD-10-CM | POA: Diagnosis present

## 2021-03-15 DIAGNOSIS — R531 Weakness: Secondary | ICD-10-CM

## 2021-03-15 DIAGNOSIS — W1830XA Fall on same level, unspecified, initial encounter: Secondary | ICD-10-CM | POA: Diagnosis present

## 2021-03-15 DIAGNOSIS — Z79899 Other long term (current) drug therapy: Secondary | ICD-10-CM

## 2021-03-15 DIAGNOSIS — J9601 Acute respiratory failure with hypoxia: Secondary | ICD-10-CM | POA: Diagnosis present

## 2021-03-15 DIAGNOSIS — Z20822 Contact with and (suspected) exposure to covid-19: Secondary | ICD-10-CM | POA: Diagnosis present

## 2021-03-15 DIAGNOSIS — R600 Localized edema: Secondary | ICD-10-CM | POA: Diagnosis present

## 2021-03-15 DIAGNOSIS — Z8249 Family history of ischemic heart disease and other diseases of the circulatory system: Secondary | ICD-10-CM

## 2021-03-15 DIAGNOSIS — Z885 Allergy status to narcotic agent status: Secondary | ICD-10-CM

## 2021-03-15 DIAGNOSIS — I5021 Acute systolic (congestive) heart failure: Secondary | ICD-10-CM | POA: Diagnosis present

## 2021-03-15 DIAGNOSIS — R739 Hyperglycemia, unspecified: Secondary | ICD-10-CM

## 2021-03-15 DIAGNOSIS — D72829 Elevated white blood cell count, unspecified: Secondary | ICD-10-CM

## 2021-03-15 DIAGNOSIS — R253 Fasciculation: Secondary | ICD-10-CM | POA: Diagnosis present

## 2021-03-15 DIAGNOSIS — J440 Chronic obstructive pulmonary disease with acute lower respiratory infection: Secondary | ICD-10-CM | POA: Diagnosis present

## 2021-03-15 DIAGNOSIS — Y92009 Unspecified place in unspecified non-institutional (private) residence as the place of occurrence of the external cause: Secondary | ICD-10-CM

## 2021-03-15 LAB — BASIC METABOLIC PANEL
Anion gap: 12 (ref 5–15)
BUN: 16 mg/dL (ref 6–20)
CO2: 30 mmol/L (ref 22–32)
Calcium: 8.5 mg/dL — ABNORMAL LOW (ref 8.9–10.3)
Chloride: 96 mmol/L — ABNORMAL LOW (ref 98–111)
Creatinine, Ser: 0.7 mg/dL (ref 0.44–1.00)
GFR, Estimated: >60 mL/min (ref >60)
Glucose, Bld: 123 mg/dL — ABNORMAL HIGH (ref 70–99)
Potassium: 4.1 mmol/L (ref 3.5–5.1)
Sodium: 138 mmol/L (ref 135–145)

## 2021-03-15 LAB — RESP PANEL BY RT-PCR (FLU A&B, COVID) ARPGX2
Influenza A by PCR: NEGATIVE
Influenza B by PCR: NEGATIVE
SARS Coronavirus 2 by RT PCR: NEGATIVE

## 2021-03-15 LAB — CBC
HCT: 49.3 % — ABNORMAL HIGH (ref 36.0–46.0)
Hemoglobin: 15 g/dL (ref 12.0–15.0)
MCH: 28.2 pg (ref 26.0–34.0)
MCHC: 30.4 g/dL (ref 30.0–36.0)
MCV: 92.8 fL (ref 80.0–100.0)
Platelets: 324 10*3/uL (ref 150–400)
RBC: 5.31 MIL/uL — ABNORMAL HIGH (ref 3.87–5.11)
RDW: 14.6 % (ref 11.5–15.5)
WBC: 11.3 10*3/uL — ABNORMAL HIGH (ref 4.0–10.5)
nRBC: 0 % (ref 0.0–0.2)

## 2021-03-15 MED ORDER — IPRATROPIUM-ALBUTEROL 0.5-2.5 (3) MG/3ML IN SOLN
3.0000 mL | Freq: Once | RESPIRATORY_TRACT | Status: AC
  Administered 2021-03-16: 3 mL via RESPIRATORY_TRACT
  Filled 2021-03-15: qty 3

## 2021-03-15 MED ORDER — METHYLPREDNISOLONE SODIUM SUCC 125 MG IJ SOLR
125.0000 mg | Freq: Once | INTRAMUSCULAR | Status: AC
  Administered 2021-03-15: 125 mg via INTRAVENOUS
  Filled 2021-03-15: qty 2

## 2021-03-15 MED ORDER — ALBUTEROL SULFATE HFA 108 (90 BASE) MCG/ACT IN AERS
6.0000 | INHALATION_SPRAY | Freq: Once | RESPIRATORY_TRACT | Status: AC
  Administered 2021-03-15: 6 via RESPIRATORY_TRACT
  Filled 2021-03-15: qty 6.7

## 2021-03-15 NOTE — H&P (Addendum)
History and Physical  Holly Fuentes SWH:[id number] DOB: 01/22/1971 DOA: 03/15/2021  Referring physician: Karrie Meres, PA-C  PCP: Pcp, No  Patient coming from: Home  Chief Complaint: Generalized weakness  HPI: Holly Fuentes is a 50 y.o. female with medical history significant for depression, anxiety, PTSD, prior tobacco use who presents to the emergency department due to generalized weakness.  She states that she had a fall on Friday (7/29) and landed on her buttocks without hitting her head, patient states that she went to bed to take a nap after the fall, on waking up, she developed cough (nonproductive), this progressed to increased shortness of breath today and worsening weakness with occasional twitching sensation of her hands or legs.  EMS was activated and patient was taken to the ED for further evaluation and management.  She denies fever, chills, chest pain, nausea, vomiting or abdominal pain.  ED Course:  In the emergency department, she was hemodynamically stable except for hypoxia with O2 sats ranging from 87-89% on room air.  Supplemental oxygen via [place] at 2 LPM was provided with improved oxygenation and O2 sat at 91-95%.  Work-up in the ED showed normal CBC except for mild leukocytosis and normal BMP except for mild hyperglycemia.  Influenza A, B and SARS coronavirus 2 was negative. Chest x-ray showed mild bronchitic changes with no confluent pulmonary infiltrate. Breathing treatment and Solu-Medrol was provided.  Hospitalist was asked to admit patient for further evaluation and management.  Review of Systems: Constitutional: Negative for chills and fever.  HENT: Negative for ear pain and sore throat.   Eyes: Negative for pain and visual disturbance.  Respiratory: Positive for cough and shortness of breath.   Cardiovascular: Negative for chest pain and palpitations.  Gastrointestinal: Negative for abdominal pain and vomiting.  Endocrine: Negative for polyphagia and  polyuria.  Genitourinary: Negative for decreased urine volume, dysuria, enuresis Musculoskeletal: Negative for arthralgias and back pain.  Skin: Negative for color change and rash.  Allergic/Immunologic: Negative for immunocompromised state.  Neurological: Negative for tremors, syncope, speech difficulty Hematological: Does not bruise/bleed easily.  All other systems reviewed and are negative   Past Medical History:  Diagnosis Date   Anxiety    Depression    PTSD (post-traumatic stress disorder)    Past Surgical History:  Procedure Laterality Date   BACK SURGERY     CESAREAN SECTION      Social History:  reports that she has been smoking cigarettes. She has been smoking an average of .5 packs per day. She does not have any smokeless tobacco history on file. She reports that she does not drink alcohol and does not use drugs.   Allergies  Allergen Reactions   Cranberry Hives   Hydrocodone-Acetaminophen Other (See Comments)    Reaction : migraines    Family History  Problem Relation Age of Onset   Hypertension Mother    Cancer Maternal Grandmother    Heart disease Maternal Grandmother    Heart disease Maternal Grandfather      Prior to Admission medications   Medication Sig Start Date End Date Taking? Authorizing Provider  amoxicillin (AMOXIL) 500 MG capsule Take 1 capsule (500 mg total) by mouth 3 (three) times daily. Patient not taking: Reported on 03/15/2021 09/27/15   Muthersbaugh, Dahlia Client, PA-C  busPIRone (BUSPAR) 7.5 MG tablet Take 1 tablet (7.5 mg total) by mouth 2 (two) times daily. Patient not taking: Reported on 03/15/2021 11/04/13   Maryagnes Amos, FNP  citalopram (CELEXA) 20 MG  tablet Take 1 tablet (20 mg total) by mouth daily. Patient not taking: Reported on 03/15/2021 11/04/13   Maryagnes Amos, FNP  clonazePAM (KLONOPIN) 0.5 MG tablet Take 1 tablet (0.5 mg total) by mouth 3 (three) times daily as needed (anxiety). Patient not taking: Reported on  03/15/2021 11/04/13   Maryagnes Amos, FNP  cyclobenzaprine (FLEXERIL) 10 MG tablet Take 1 tablet (10 mg total) by mouth 2 (two) times daily as needed for muscle spasms. Patient not taking: Reported on 03/15/2021 10/22/14   Marlon Pel, PA-C  ibuprofen (ADVIL,MOTRIN) 600 MG tablet Take 1 tablet (600 mg total) by mouth every 6 (six) hours as needed. Patient not taking: Reported on 03/15/2021 10/22/14   Marlon Pel, PA-C  ibuprofen (ADVIL,MOTRIN) 800 MG tablet Take 1 tablet (800 mg total) by mouth 3 (three) times daily. Patient not taking: Reported on 03/15/2021 02/01/16   Barrett, Rolm Gala, PA-C  methocarbamol (ROBAXIN) 500 MG tablet Take 1 tablet (500 mg total) by mouth 2 (two) times daily. Patient not taking: Reported on 03/15/2021 02/01/16   Barrett, Rolm Gala, PA-C  oxyCODONE-acetaminophen (PERCOCET/ROXICET) 5-325 MG per tablet Take 1-2 tablets by mouth every 6 (six) hours as needed for severe pain. Patient not taking: Reported on 03/15/2021 10/22/14   Marlon Pel, PA-C  traZODone (DESYREL) 100 MG tablet Take 1 tablet (100 mg total) by mouth at bedtime as needed for sleep. Patient not taking: Reported on 03/15/2021 11/04/13   Maryagnes Amos, FNP    Physical Exam: BP 112/78   Pulse 85   Temp 98.7 F (37.1 C) (Oral)   Resp 20   Ht 5\' 3"  (1.6 m)   Wt 99.8 kg   SpO2 93%   BMI 38.97 kg/m   General: 50 y.o. year-old female well developed well nourished in no acute distress.  Alert and oriented x3. HEENT: Dry mucous membrane.  NCAT, EOMI Neck: Supple, trachea medial Cardiovascular: Regular rate and rhythm with no rubs or gallops.  No thyromegaly or JVD noted.  No lower extremity edema. 2/4 pulses in all 4 extremities. Respiratory: Bilateral rales in lower lobes with few scattered expiratory wheezes.   Abdomen: Soft, nontender nondistended with normal bowel sounds x4 quadrants. Muskuloskeletal: No cyanosis, clubbing or edema noted bilaterally Neuro: CN II-XII intact, strength 5/5 x 4,  sensation, reflexes intact Skin: No ulcerative lesions noted or rashes Psychiatry: Mood is appropriate for condition and setting          Labs on Admission:  Basic Metabolic Panel: Recent Labs  Lab 03/15/21 1815  NA 138  K 4.1  CL 96*  CO2 30  GLUCOSE 123*  BUN 16  CREATININE 0.70  CALCIUM 8.5*   Liver Function Tests: No results for input(s): AST, ALT, ALKPHOS, BILITOT, PROT, ALBUMIN in the last 168 hours. No results for input(s): LIPASE, AMYLASE in the last 168 hours. No results for input(s): AMMONIA in the last 168 hours. CBC: Recent Labs  Lab 03/15/21 1815  WBC 11.3*  HGB 15.0  HCT 49.3*  MCV 92.8  PLT 324   Cardiac Enzymes: No results for input(s): CKTOTAL, CKMB, CKMBINDEX, TROPONINI in the last 168 hours.  BNP (last 3 results) No results for input(s): BNP in the last 8760 hours.  ProBNP (last 3 results) No results for input(s): PROBNP in the last 8760 hours.  CBG: No results for input(s): GLUCAP in the last 168 hours.  Radiological Exams on Admission: DG Chest 2 View  Result Date: 03/15/2021 CLINICAL DATA:  Weakness EXAM: CHEST - 2  VIEW COMPARISON:  05/15/2004 FINDINGS: The lungs are symmetrically well expanded. There is mild central bronchial wall thickening in keeping with probable airway inflammation. No pneumothorax or pleural effusion. No confluent pulmonary infiltrate. Cardiac size within normal limits. No acute bone abnormality. IMPRESSION: Mild bronchitic changes noted.  No confluent pulmonary infiltrate. Electronically Signed   By: Helyn Numbers MD   On: 03/15/2021 19:21    EKG: I independently viewed the EKG done and my findings are as followed: Normal sinus rhythm at a rate of 93 bpm  Assessment/Plan Present on Admission:  Acute respiratory failure with hypoxia (HCC)  Principal Problem:   Acute respiratory failure with hypoxia (HCC) Active Problems:   Acute bronchitis   Generalized weakness   Fall at home, initial encounter    Leukocytosis   Hyperglycemia   Anxiety   Depression  Acute respiratory failure with hypoxia possibly secondary to acute bronchitis Chest x-ray was suggestive of acute bronchitis Continue duo nebs, Mucinex, Solu-Medrol Continue Protonix to prevent steroid-induced ulcer Continue incentive spirometry and flutter valve Continue supplemental oxygen to maintain O2 sat > 92% with plan to wean patient off oxygen as tolerated  Mechanical fall secondary to generalized weakness Orthostatic vital signs will be checked Continue PT/OT eval and treat Continue fall precaution and neurochecks  Dehydration Continue IV hydration  Leukocytosis possibly reactive WBC 11.3, continue to monitor WBC with morning labs  Hyperglycemia possibly reactive CBG 123, continue to monitor blood glucose level with morning labs  Anxiety/depression Patient is currently not taking any medication at this time per med rec  Obesity (BMI 38.97) Patient was counseled on diet and lifestyle modification   DVT prophylaxis: Lovenox  Code Status: Full code  Family Communication: None at bedside  Disposition Plan:  Patient is from:                        home Anticipated DC to:                   SNF or family members home Anticipated DC date:               2-3 days Anticipated DC barriers:          Patient requires inpatient management due to hypoxia requiring supplemental oxygen secondary to acute bronchitis and pending evaluation by PT/OT  Consults called: None  Admission status: Observation    Frankey Shown MD [facility]  03/16/2021, 4:51 AM

## 2021-03-15 NOTE — ED Notes (Signed)
Pt reports cough and sweats and weakness that started on Friday and sob that started today.

## 2021-03-15 NOTE — ED Notes (Signed)
O2 sats on room air 84-86% at rest. Oxygen applied at 2lnc.

## 2021-03-15 NOTE — ED Provider Notes (Signed)
[facility] EMERGENCY DEPARTMENT Provider Note   CSN: [id number] Arrival date & time: 03/15/21  1752     History Chief Complaint  Patient presents with   Weakness    Holly Fuentes is a 50 y.o. female.  HPI   Pt is a 50 y/o female with a h/o anxiety, depression, PTSD who presents to the ED today for eval of weakness. State she had a fall several days ago after getting up and feeling weak. Denies head trauma at that time. Since then she states she has not felt well. She states her arms/legs are twitching, she has had a cough, sob and has been generally weak. Denies vomiting, diarrhea or chest pain. She does reports sweats. Denies other uri sxs.   Past Medical History:  Diagnosis Date   Anxiety    Depression    PTSD (post-traumatic stress disorder)     Patient Active Problem List   Diagnosis Date Noted   MDD (major depressive disorder), recurrent episode, severe (HCC) 11/01/2013   OTHER CHRONIC PAIN 04/27/2010   HIP, ARTHRITIS, DEGEN./OSTEO 04/27/2010   HIP PAIN 04/27/2010   DEGENERATIVE DISC DISEASE, LUMBOSACRAL SPINE W/RADICULOPATHY 04/27/2010    Past Surgical History:  Procedure Laterality Date   BACK SURGERY     CESAREAN SECTION       OB History     Gravida  4   Para  1   Term  1   Preterm  0   AB  2   Living  1      SAB  2   IAB  0   Ectopic  0   Multiple  0   Live Births              Family History  Problem Relation Age of Onset   Hypertension Mother    Cancer Maternal Grandmother    Heart disease Maternal Grandmother    Heart disease Maternal Grandfather     Social History   Tobacco Use   Smoking status: Every Day    Packs/day: 0.50    Types: Cigarettes  Substance Use Topics   Alcohol use: No   Drug use: No    Types: Other-see comments    Comment: Pills- states she will be clean x1 year in Feb 2013    Home Medications Prior to Admission medications   Medication Sig Start Date End Date Taking? Authorizing Provider   amoxicillin (AMOXIL) 500 MG capsule Take 1 capsule (500 mg total) by mouth 3 (three) times daily. Patient not taking: Reported on 03/15/2021 09/27/15   Muthersbaugh, Dahlia Client, PA-C  busPIRone (BUSPAR) 7.5 MG tablet Take 1 tablet (7.5 mg total) by mouth 2 (two) times daily. Patient not taking: Reported on 03/15/2021 11/04/13   Maryagnes Amos, FNP  citalopram (CELEXA) 20 MG tablet Take 1 tablet (20 mg total) by mouth daily. Patient not taking: Reported on 03/15/2021 11/04/13   Maryagnes Amos, FNP  clonazePAM (KLONOPIN) 0.5 MG tablet Take 1 tablet (0.5 mg total) by mouth 3 (three) times daily as needed (anxiety). Patient not taking: Reported on 03/15/2021 11/04/13   Maryagnes Amos, FNP  cyclobenzaprine (FLEXERIL) 10 MG tablet Take 1 tablet (10 mg total) by mouth 2 (two) times daily as needed for muscle spasms. Patient not taking: Reported on 03/15/2021 10/22/14   Marlon Pel, PA-C  ibuprofen (ADVIL,MOTRIN) 600 MG tablet Take 1 tablet (600 mg total) by mouth every 6 (six) hours as needed. Patient not taking: Reported on 03/15/2021 10/22/14  Neva Seat, Tiffany, PA-C  ibuprofen (ADVIL,MOTRIN) 800 MG tablet Take 1 tablet (800 mg total) by mouth 3 (three) times daily. Patient not taking: Reported on 03/15/2021 02/01/16   Barrett, Rolm Gala, PA-C  methocarbamol (ROBAXIN) 500 MG tablet Take 1 tablet (500 mg total) by mouth 2 (two) times daily. Patient not taking: Reported on 03/15/2021 02/01/16   Barrett, Rolm Gala, PA-C  oxyCODONE-acetaminophen (PERCOCET/ROXICET) 5-325 MG per tablet Take 1-2 tablets by mouth every 6 (six) hours as needed for severe pain. Patient not taking: Reported on 03/15/2021 10/22/14   Marlon Pel, PA-C  traZODone (DESYREL) 100 MG tablet Take 1 tablet (100 mg total) by mouth at bedtime as needed for sleep. Patient not taking: Reported on 03/15/2021 11/04/13   Maryagnes Amos, FNP    Allergies    Cranberry and Hydrocodone-acetaminophen  Review of Systems   Review of  Systems  Constitutional:  Positive for diaphoresis. Negative for fever.  HENT:  Positive for congestion. Negative for ear pain and sore throat.   Eyes:  Negative for visual disturbance.  Respiratory:  Positive for cough and shortness of breath.   Cardiovascular:  Negative for chest pain and leg swelling.  Gastrointestinal:  Negative for abdominal pain, constipation, diarrhea, nausea and vomiting.  Genitourinary:  Negative for dysuria and hematuria.  Musculoskeletal:  Negative for back pain.  Skin:  Negative for rash.  Neurological:  Negative for headaches.  All other systems reviewed and are negative.  Physical Exam Updated Vital Signs BP (!) 161/120   Pulse 85   Temp 98.7 F (37.1 C) (Oral)   Resp 19   Ht 5\' 3"  (1.6 m)   Wt 99.8 kg   SpO2 91%   BMI 38.97 kg/m   Physical Exam Vitals and nursing note reviewed.  Constitutional:      General: She is not in acute distress.    Appearance: She is well-developed.  HENT:     Head: Normocephalic and atraumatic.     Mouth/Throat:     Mouth: Mucous membranes are dry.  Eyes:     Conjunctiva/sclera: Conjunctivae normal.  Cardiovascular:     Rate and Rhythm: Normal rate and regular rhythm.     Heart sounds: Normal heart sounds. No murmur heard. Pulmonary:     Effort: Pulmonary effort is normal. No respiratory distress.     Breath sounds: Wheezing and rales present.  Abdominal:     General: Bowel sounds are normal.     Palpations: Abdomen is soft.     Tenderness: There is no abdominal tenderness. There is no guarding or rebound.  Musculoskeletal:     Cervical back: Neck supple.     Right lower leg: No edema.     Left lower leg: No edema.  Skin:    General: Skin is warm and dry.  Neurological:     Mental Status: She is alert.    ED Results / Procedures / Treatments   Labs (all labs ordered are listed, but only abnormal results are displayed) Labs Reviewed  BASIC METABOLIC PANEL - Abnormal; Notable for the following  components:      Result Value   Chloride 96 (*)    Glucose, Bld 123 (*)    Calcium 8.5 (*)    All other components within normal limits  CBC - Abnormal; Notable for the following components:   WBC 11.3 (*)    RBC 5.31 (*)    HCT 49.3 (*)    All other components within normal limits  RESP PANEL BY  RT-PCR (FLU A&B, COVID) ARPGX2  URINALYSIS, ROUTINE W REFLEX MICROSCOPIC    EKG EKG Interpretation  Date/Time:  "Sunday March 15 2021 21:46:50 EDT Ventricular Rate:  91 PR Interval:  128 QRS Duration: 76 QT Interval:  384 QTC Calculation: 473 R Axis:   80 Text Interpretation: Sinus rhythm Right atrial enlargement Low voltage, precordial leads Borderline T abnormalities, diffuse leads Baseline wander in lead(s) III No previous ECGs available Confirmed by Zackowski, Scott ([id number]) on 03/15/2021 11:04:35 PM  Radiology DG Chest 2 View  Result Date: 03/15/2021 CLINICAL DATA:  Weakness EXAM: CHEST - 2 VIEW COMPARISON:  05/15/2004 FINDINGS: The lungs are symmetrically well expanded. There is mild central bronchial wall thickening in keeping with probable airway inflammation. No pneumothorax or pleural effusion. No confluent pulmonary infiltrate. Cardiac size within normal limits. No acute bone abnormality. IMPRESSION: Mild bronchitic changes noted.  No confluent pulmonary infiltrate. Electronically Signed   By: Ashesh  Parikh MD   On: 03/15/2021 19:21    Procedures Procedures   CRITICAL CARE Performed by: Deisy Ozbun S Martyna Thorns   Total critical care time: 35 minutes  Critical care time was exclusive of separately billable procedures and treating other patients.  Critical care was necessary to treat or prevent imminent or life-threatening deterioration.  Critical care was time spent personally by me on the following activities: development of treatment plan with patient and/or surrogate as well as nursing, discussions with consultants, evaluation of patient's response to treatment, examination of  patient, obtaining history from patient or surrogate, ordering and performing treatments and interventions, ordering and review of laboratory studies, ordering and review of radiographic studies, pulse oximetry and re-evaluation of patient's condition.   Medications Ordered in ED Medications  ipratropium-albuterol (DUONEB) 0.5-2.5 (3) MG/3ML nebulizer solution 3 mL (has no administration in time range)  albuterol (VENTOLIN HFA) 108 (90 Base) MCG/ACT inhaler 6 puff (6 puffs Inhalation Given 03/15/21 2246)  methylPREDNISolone sodium succinate (SOLU-MEDROL) 125 mg/2 mL injection 125 mg (125 mg Intravenous Given 03/15/21 2245)    ED Course  I have reviewed the triage vital signs and the nursing notes.  Pertinent labs & imaging results that were available during my care of the patient were reviewed by me and considered in my medical decision making (see chart for details).    MDM Rules/Calculators/A&P                          50"  year old female presents here for generalized weakness and shortness of breath.  Also with cough for the last few days.  Reviewed/interpreted labs CBC reveals a mild leukocytosis of 11,000, no anemia BMP is grossly unremarkable Ua pending  COVID negative  EKG with nsr, right atrial enlargement, low voltage in the precordial leads, borderline t abnormalities diffusely, baseline wander leads in III  Reviewed/interpreted labs CXR - Mild bronchitic changes noted.  No confluent pulmonary infiltrate.  Pt presenting for evaluation of weakness, cough, shortness of breath.  She does have some wheezing on exam and evidence of bronchitic changes noted on her chest x-ray.  She was given Solu-Medrol as well as albuterol.  She is hypoxic and placed on 2 L.  Sats improved following this.  Feel she will require admission for further treatment of her bronchitis  11:18 PM CONSULT with Dr. who accepts patient for admission  Final Clinical Impression(s) / ED Diagnoses Final  diagnoses:  Bronchitis    Rx / DC Orders ED Discharge Orders     None  Rayne DuCouture, Shiree Altemus S, PA-C 03/15/21 2320    Vanetta MuldersZackowski, Scott, MD 03/19/21 1255

## 2021-03-15 NOTE — ED Triage Notes (Signed)
Pt c/o sore throat since Thursday, on Friday pt began having weakness.

## 2021-03-16 ENCOUNTER — Observation Stay (HOSPITAL_COMMUNITY): Payer: Self-pay

## 2021-03-16 ENCOUNTER — Encounter (HOSPITAL_COMMUNITY): Payer: Self-pay | Admitting: Internal Medicine

## 2021-03-16 DIAGNOSIS — D72829 Elevated white blood cell count, unspecified: Secondary | ICD-10-CM

## 2021-03-16 DIAGNOSIS — Y92009 Unspecified place in unspecified non-institutional (private) residence as the place of occurrence of the external cause: Secondary | ICD-10-CM

## 2021-03-16 DIAGNOSIS — W19XXXA Unspecified fall, initial encounter: Secondary | ICD-10-CM

## 2021-03-16 DIAGNOSIS — E860 Dehydration: Secondary | ICD-10-CM

## 2021-03-16 DIAGNOSIS — J441 Chronic obstructive pulmonary disease with (acute) exacerbation: Secondary | ICD-10-CM

## 2021-03-16 DIAGNOSIS — R739 Hyperglycemia, unspecified: Secondary | ICD-10-CM

## 2021-03-16 DIAGNOSIS — R531 Weakness: Secondary | ICD-10-CM

## 2021-03-16 DIAGNOSIS — F419 Anxiety disorder, unspecified: Secondary | ICD-10-CM

## 2021-03-16 DIAGNOSIS — J209 Acute bronchitis, unspecified: Secondary | ICD-10-CM

## 2021-03-16 DIAGNOSIS — F32A Depression, unspecified: Secondary | ICD-10-CM

## 2021-03-16 LAB — COMPREHENSIVE METABOLIC PANEL
ALT: 19 U/L (ref 0–44)
AST: 27 U/L (ref 15–41)
Albumin: 3.3 g/dL — ABNORMAL LOW (ref 3.5–5.0)
Alkaline Phosphatase: 94 U/L (ref 38–126)
Anion gap: 9 (ref 5–15)
BUN: 17 mg/dL (ref 6–20)
CO2: 31 mmol/L (ref 22–32)
Calcium: 8.2 mg/dL — ABNORMAL LOW (ref 8.9–10.3)
Chloride: 95 mmol/L — ABNORMAL LOW (ref 98–111)
Creatinine, Ser: 0.53 mg/dL (ref 0.44–1.00)
GFR, Estimated: >60 mL/min (ref >60)
Glucose, Bld: 167 mg/dL — ABNORMAL HIGH (ref 70–99)
Potassium: 4.2 mmol/L (ref 3.5–5.1)
Sodium: 135 mmol/L (ref 135–145)
Total Bilirubin: 1.4 mg/dL — ABNORMAL HIGH (ref 0.3–1.2)
Total Protein: 7.9 g/dL (ref 6.5–8.1)

## 2021-03-16 LAB — PHOSPHORUS: Phosphorus: 3.2 mg/dL (ref 2.5–4.6)

## 2021-03-16 LAB — CBC
HCT: 48.6 % — ABNORMAL HIGH (ref 36.0–46.0)
Hemoglobin: 15 g/dL (ref 12.0–15.0)
MCH: 28.6 pg (ref 26.0–34.0)
MCHC: 30.9 g/dL (ref 30.0–36.0)
MCV: 92.7 fL (ref 80.0–100.0)
Platelets: 285 10*3/uL (ref 150–400)
RBC: 5.24 MIL/uL — ABNORMAL HIGH (ref 3.87–5.11)
RDW: 14.6 % (ref 11.5–15.5)
WBC: 9.7 10*3/uL (ref 4.0–10.5)
nRBC: 0 % (ref 0.0–0.2)

## 2021-03-16 LAB — BRAIN NATRIURETIC PEPTIDE: B Natriuretic Peptide: 1291 pg/mL — ABNORMAL HIGH (ref 0.0–100.0)

## 2021-03-16 LAB — MAGNESIUM
Magnesium: 1.9 mg/dL (ref 1.7–2.4)
Magnesium: 1.9 mg/dL (ref 1.7–2.4)

## 2021-03-16 LAB — T4, FREE: Free T4: 1.07 ng/dL (ref 0.61–1.12)

## 2021-03-16 LAB — VITAMIN B12: Vitamin B-12: 428 pg/mL (ref 180–914)

## 2021-03-16 LAB — PROTIME-INR
INR: 1.2 (ref 0.8–1.2)
Prothrombin Time: 15 seconds (ref 11.4–15.2)

## 2021-03-16 LAB — TSH: TSH: 0.486 u[IU]/mL (ref 0.350–4.500)

## 2021-03-16 LAB — FOLATE: Folate: 9.9 ng/mL (ref >5.9)

## 2021-03-16 LAB — APTT: aPTT: 25 seconds (ref 24–36)

## 2021-03-16 LAB — HIV ANTIBODY (ROUTINE TESTING W REFLEX): HIV Screen 4th Generation wRfx: NONREACTIVE

## 2021-03-16 LAB — HEMOGLOBIN A1C
Hgb A1c MFr Bld: 6.2 % — ABNORMAL HIGH (ref 4.8–5.6)
Mean Plasma Glucose: 131.24 mg/dL

## 2021-03-16 LAB — PROCALCITONIN: Procalcitonin: <0.1 ng/mL

## 2021-03-16 MED ORDER — ENOXAPARIN SODIUM 40 MG/0.4ML IJ SOSY
40.0000 mg | PREFILLED_SYRINGE | INTRAMUSCULAR | Status: DC
  Administered 2021-03-17 – 2021-03-20 (×4): 40 mg via SUBCUTANEOUS
  Filled 2021-03-16 (×4): qty 0.4

## 2021-03-16 MED ORDER — ALBUTEROL SULFATE (2.5 MG/3ML) 0.083% IN NEBU
INHALATION_SOLUTION | RESPIRATORY_TRACT | Status: AC
  Administered 2021-03-16: 2.5 mg
  Filled 2021-03-16: qty 3

## 2021-03-16 MED ORDER — ACETAMINOPHEN 325 MG PO TABS
650.0000 mg | ORAL_TABLET | Freq: Four times a day (QID) | ORAL | Status: DC | PRN
  Administered 2021-03-16 – 2021-03-20 (×3): 650 mg via ORAL
  Filled 2021-03-16 (×3): qty 2

## 2021-03-16 MED ORDER — METHYLPREDNISOLONE SODIUM SUCC 125 MG IJ SOLR
80.0000 mg | Freq: Two times a day (BID) | INTRAMUSCULAR | Status: DC
  Administered 2021-03-16 – 2021-03-19 (×7): 80 mg via INTRAVENOUS
  Filled 2021-03-16 (×7): qty 2

## 2021-03-16 MED ORDER — METHYLPREDNISOLONE SODIUM SUCC 40 MG IJ SOLR: 40.0000 mg | Freq: Two times a day (BID) | INTRAMUSCULAR | Status: DC

## 2021-03-16 MED ORDER — IPRATROPIUM-ALBUTEROL 0.5-2.5 (3) MG/3ML IN SOLN
3.0000 mL | Freq: Four times a day (QID) | RESPIRATORY_TRACT | Status: DC
  Administered 2021-03-16 – 2021-03-19 (×11): 3 mL via RESPIRATORY_TRACT
  Filled 2021-03-16 (×11): qty 3

## 2021-03-16 MED ORDER — DM-GUAIFENESIN ER 30-600 MG PO TB12
1.0000 | ORAL_TABLET | Freq: Two times a day (BID) | ORAL | Status: DC
  Administered 2021-03-16 – 2021-03-17 (×3): 1 via ORAL
  Filled 2021-03-16 (×3): qty 1

## 2021-03-16 MED ORDER — IPRATROPIUM-ALBUTEROL 0.5-2.5 (3) MG/3ML IN SOLN: 3.0000 mL | RESPIRATORY_TRACT | Status: DC | PRN

## 2021-03-16 MED ORDER — ONDANSETRON HCL 4 MG/2ML IJ SOLN: 4.0000 mg | Freq: Four times a day (QID) | INTRAMUSCULAR | Status: DC | PRN

## 2021-03-16 MED ORDER — PANTOPRAZOLE SODIUM 40 MG PO TBEC
40.0000 mg | DELAYED_RELEASE_TABLET | Freq: Every day | ORAL | Status: DC
  Administered 2021-03-16 – 2021-03-20 (×5): 40 mg via ORAL
  Filled 2021-03-16 (×5): qty 1

## 2021-03-16 MED ORDER — POLYETHYLENE GLYCOL 3350 17 G PO PACK
17.0000 g | PACK | Freq: Every day | ORAL | Status: DC
  Administered 2021-03-16 – 2021-03-20 (×3): 17 g via ORAL
  Filled 2021-03-16 (×5): qty 1

## 2021-03-16 MED ORDER — BUDESONIDE 0.5 MG/2ML IN SUSP
0.5000 mg | Freq: Two times a day (BID) | RESPIRATORY_TRACT | Status: DC
  Administered 2021-03-16 – 2021-03-20 (×8): 0.5 mg via RESPIRATORY_TRACT
  Filled 2021-03-16 (×8): qty 2

## 2021-03-16 MED ORDER — SENNA 8.6 MG PO TABS
2.0000 | ORAL_TABLET | Freq: Every day | ORAL | Status: DC
  Administered 2021-03-16 – 2021-03-20 (×3): 17.2 mg via ORAL
  Filled 2021-03-16 (×6): qty 2

## 2021-03-16 NOTE — Progress Notes (Signed)
PROGRESS NOTE  Holly Fuentes:[id number] DOB: May 07, 1971 DOA: 03/15/2021 PCP: Pcp, No  Brief History:  50 year old with a history of depression/anxiety/tobacco abuse/PTSD presenting with generalized weakness and shortness of breath.  The patient states that she began having shortness of breath on 03/13/2021.  She stated that she had a mechanical fall onto her buttocks on 03/13/2021.  She went to take a nap and woke up with shortness of breath and generalized weakness.  Her shortness of breath progressed over the next 2 days with increasing cough and shortness of breath.  She denies any hemoptysis, fevers, chills, chest pain, nausea, vomiting, diarrhea, abdominal pain.  The patient states that she quit smoking 6 weeks ago after about 20-pack-year history.  She is exposed to significant amount of secondhand smoke.  The patient states that she has had lower extremity edema which has been chronic for the last 6 to 12 months.  She states that is actually a little better than usual.  In the emergency department, the patient was afebrile hemodynamically stable with oxygen saturation 88% on room air.  The patient was placed on 2 L with saturation up to 96%.  She will start on bronchodilators and intravenous steroids.  Assessment/Plan: Acute respiratory failure with hypoxia -Secondary to COPD exacerbation -Stable on 2 L nasal cannula -Wean oxygen as tolerated for saturation greater 92% -Check BNP -Personally reviewed chest x-ray--increased interstitial markings -CT chest  COPD exacerbation -Continue duo nebs -Add Pulmicort -Continue IV Solu-Medrol  Lower extremity edema -Venous duplex  Anxiety/depression -Start BuSpar  Generalized weakness -Likely secondary to respiratory failure -Check B12 -Folic acid -TSH -Urinalysis -Urine drug screen  Hyperglycemia -Check hemoglobin A1c  Class II obesity -BMI 38.97 -lifestyle modification      Status is: Observation  The  patient will require care spanning > 2 midnights and should be moved to inpatient because: IV treatments appropriate due to intensity of illness or inability to take PO  Dispo: The patient is from: Home              Anticipated d/c is to: Home              Patient currently is not medically stable to d/c.   Difficult to place patient No        Family Communication:  no Family at bedside  Consultants:  none  Code Status:  FULL /  DVT Prophylaxis:  [place] Lovenox   Procedures: As Listed in Progress Note Above  Antibiotics: None      Subjective: Patient remains short of breath.  She is slightly better than yesterday.  She complains of a nonproductive cough.  She denies any hemoptysis.  She denies any fevers, chills, chest pain, nausea, vomiting, diarrhea, Donnell pain.  Objective: Vitals:   03/16/21 0600 03/16/21 0601 03/16/21 0633 03/16/21 0749  BP: 125/85  (!) 146/84   Pulse: 75 80 85   Resp: 20  20   Temp: 98 F (36.7 C)     TempSrc: Oral     SpO2: 97% 96% 96% 100%  Weight:      Height:       No intake or output data in the 24 hours ending 03/16/21 0828 Weight change:  Exam:  General:  Pt is alert, follows commands appropriately, not in acute distress HEENT: No icterus, No thrush, No neck mass, [place]/AT Cardiovascular: RRR, S1/S2, no rubs, no gallops Respiratory: Diminished breath sounds bilateral.  Bibasilar  wheeze. Abdomen: Soft/+BS, non tender, non distended, no guarding Extremities: 1 + LE edema, No lymphangitis, No petechiae, No rashes, no synovitis   Data Reviewed: I have personally reviewed following labs and imaging studies Basic Metabolic Panel: Recent Labs  Lab 03/15/21 1815  NA 138  K 4.1  CL 96*  CO2 30  GLUCOSE 123*  BUN 16  CREATININE 0.70  CALCIUM 8.5*   Liver Function Tests: No results for input(s): AST, ALT, ALKPHOS, BILITOT, PROT, ALBUMIN in the last 168 hours. No results for input(s): LIPASE, AMYLASE in the last 168 hours. No  results for input(s): AMMONIA in the last 168 hours. Coagulation Profile: No results for input(s): INR, PROTIME in the last 168 hours. CBC: Recent Labs  Lab 03/15/21 1815  WBC 11.3*  HGB 15.0  HCT 49.3*  MCV 92.8  PLT 324   Cardiac Enzymes: No results for input(s): CKTOTAL, CKMB, CKMBINDEX, TROPONINI in the last 168 hours. BNP: Invalid input(s): POCBNP CBG: No results for input(s): GLUCAP in the last 168 hours. HbA1C: No results for input(s): HGBA1C in the last 72 hours. Urine analysis:    Component Value Date/Time   COLORURINE YELLOW 08/18/2011 1320   APPEARANCEUR HAZY (A) 08/18/2011 1320   LABSPEC >1.030 (H) 08/18/2011 1320   PHURINE 5.5 08/18/2011 1320   GLUCOSEU 100 (A) 08/18/2011 1320   HGBUR LARGE (A) 08/18/2011 1320   BILIRUBINUR NEGATIVE 08/18/2011 1320   KETONESUR NEGATIVE 08/18/2011 1320   PROTEINUR 100 (A) 08/18/2011 1320   UROBILINOGEN 2.0 (H) 08/18/2011 1320   NITRITE POSITIVE (A) 08/18/2011 1320   LEUKOCYTESUR NEGATIVE 08/18/2011 1320   Sepsis Labs: @LABRCNTIP (procalcitonin:4,lacticidven:4) ) Recent Results (from the past 240 hour(s))  Resp Panel by RT-PCR (Flu A&B, Covid) Nasopharyngeal Swab     Status: None   Collection Time: 03/15/21  9:45 PM   Specimen: Nasopharyngeal Swab; Nasopharyngeal(NP) swabs in vial transport medium  Result Value Ref Range Status   SARS Coronavirus 2 by RT PCR NEGATIVE NEGATIVE Final    Comment: (NOTE) SARS-CoV-2 target nucleic acids are NOT DETECTED.  The SARS-CoV-2 RNA is generally detectable in upper respiratory specimens during the acute phase of infection. The lowest concentration of SARS-CoV-2 viral copies this assay can detect is 138 copies/mL. A negative result does not preclude SARS-Cov-2 infection and should not be used as the sole basis for treatment or other patient management decisions. A negative result may occur with  improper specimen collection/handling, submission of specimen other than nasopharyngeal  swab, presence of viral mutation(s) within the areas targeted by this assay, and inadequate number of viral copies(<138 copies/mL). A negative result must be combined with clinical observations, patient history, and epidemiological information. The expected result is Negative.  Fact Sheet for Patients:  03/17/21  Fact Sheet for Healthcare Providers:  BloggerCourse.com  This test is no t yet approved or cleared by the [place] FDA and  has been authorized for detection and/or diagnosis of SARS-CoV-2 by FDA under an Emergency Use Authorization (EUA). This EUA will remain  in effect (meaning this test can be used) for the duration of the COVID-19 declaration under Section 564(b)(1) of the Act, 21 U.S.C.section 360bbb-3(b)(1), unless the authorization is terminated  or revoked sooner.       Influenza A by PCR NEGATIVE NEGATIVE Final   Influenza B by PCR NEGATIVE NEGATIVE Final    Comment: (NOTE) The Xpert Xpress SARS-CoV-2/FLU/RSV plus assay is intended as an aid in the diagnosis of influenza from Nasopharyngeal swab specimens and should not be  used as a sole basis for treatment. Nasal washings and aspirates are unacceptable for Xpert Xpress SARS-CoV-2/FLU/RSV testing.  Fact Sheet for Patients: BloggerCourse.com  Fact Sheet for Healthcare Providers: SeriousBroker.it  This test is not yet approved or cleared by the [place] FDA and has been authorized for detection and/or diagnosis of SARS-CoV-2 by FDA under an Emergency Use Authorization (EUA). This EUA will remain in effect (meaning this test can be used) for the duration of the COVID-19 declaration under Section 564(b)(1) of the Act, 21 U.S.C. section 360bbb-3(b)(1), unless the authorization is terminated or revoked.  Performed at [facility], [address], [place], [place] [address]      Scheduled  Meds:  dextromethorphan-guaiFENesin  1 tablet Oral BID   ipratropium-albuterol  3 mL Nebulization Q6H   methylPREDNISolone (SOLU-MEDROL) injection  40 mg Intravenous Q12H   pantoprazole  40 mg Oral Daily   Continuous Infusions:  Procedures/Studies: DG Chest 2 View  Result Date: 03/15/2021 CLINICAL DATA:  Weakness EXAM: CHEST - 2 VIEW COMPARISON:  05/15/2004 FINDINGS: The lungs are symmetrically well expanded. There is mild central bronchial wall thickening in keeping with probable airway inflammation. No pneumothorax or pleural effusion. No confluent pulmonary infiltrate. Cardiac size within normal limits. No acute bone abnormality. IMPRESSION: Mild bronchitic changes noted.  No confluent pulmonary infiltrate. Electronically Signed   By: Helyn Numbers MD   On: 03/15/2021 19:21    Catarina Hartshorn, DO  [facility]  If 7PM-7AM, please contact night-coverage [email or web address] Password [email or web address] 03/16/2021, 8:28 AM   LOS: 0 days

## 2021-03-16 NOTE — ED Notes (Signed)
PT ambulated pt in hall w/o oxygen supplementation. They state she was 93% at baseline on room air and dropped down to 85% on room air during ambulation per physical therapist. Otherwise doing well from PT standpoint.

## 2021-03-16 NOTE — Evaluation (Signed)
Occupational Therapy Evaluation Patient Details Name: Holly Fuentes MRN: [id number] DOB: [birth year] Today's Date: 03/16/2021    History of Present Illness Holly Fuentes is a 50 y.o. female with medical history significant for depression, anxiety, PTSD, prior tobacco use who presents to the emergency department due to generalized weakness.  She states that she had a fall on Friday (7/29) and landed on her buttocks without hitting her head, patient states that she went to bed to take a nap after the fall, on waking up, she developed cough (nonproductive), this progressed to increased shortness of breath today and worsening weakness with occasional twitching sensation of her hands or legs.  EMS was activated and patient was taken to the ED for further evaluation and management.  She denies fever, chills, chest pain, nausea, vomiting or abdominal pain.   Clinical Impression   Pt agreeable to OT/PT co-evaluation. Pt able to complete bed mobility and mobility in room with mod I level of assist. [facility] shoulder strength noted via 4+/5 MMT for shoulder flexion. Pt primary limitation is SOB noted by desaturation to ~85% in SpO2 levels when ambulating in hall without O2. Pt is not recommended fur further OT services and will be discharged to care of nursing staff for the length of stay.     Follow Up Recommendations  No OT follow up    Equipment Recommendations  None recommended by OT           Precautions / Restrictions Precautions Precautions: None Restrictions Weight Bearing Restrictions: No      Mobility Bed Mobility Overal bed mobility: Modified Independent                  Transfers Overall transfer level: Modified independent                    Balance Overall balance assessment: No apparent balance deficits (not formally assessed)                                         ADL either performed or assessed with clinical judgement   ADL Overall ADL's  : Modified independent                                       General ADL Comments: Per clinical judgement based on pt ability to functionally move B UE and transfer.     Vision Baseline Vision/History: Wears glasses Wears Glasses: At all times Patient Visual Report: No change from baseline                  Pertinent Vitals/Pain Pain Assessment: No/denies pain     Hand Dominance Right   Extremity/Trunk Assessment Upper Extremity Assessment Upper Extremity Assessment: Overall WFL for tasks assessed   Lower Extremity Assessment Lower Extremity Assessment: Defer to PT evaluation   Cervical / Trunk Assessment Cervical / Trunk Assessment: Normal   Communication Communication Communication: No difficulties   Cognition Arousal/Alertness: Awake/alert Behavior During Therapy: WFL for tasks assessed/performed Overall Cognitive Status: Within Functional Limits for tasks assessed  Home Living Family/patient expects to be discharged to:: Private residence Living Arrangements: Spouse/significant other Available Help at Discharge: Family;Available 24 hours/day Type of Home: Other(Comment) (Motel) Home Access: Stairs to enter Entrance Stairs-Number of Steps: 12+ Entrance Stairs-Rails: Right;Left (to wide to reach both) Home Layout: Multi-level     Bathroom Shower/Tub: Chief Strategy Officer: Standard     Home Equipment: None;Other (comment) (patient states she has access to DME)          Prior Functioning/Environment Level of Independence: Independent        Comments: Tourist information centre manager, does not drive                      OT Goals(Current goals can be found in the care plan section) Acute Rehab OT Goals Patient Stated Goal: return home with family to assist                   Co-evaluation PT/OT/SLP Co-Evaluation/Treatment: Yes Reason for  Co-Treatment: To address functional/ADL transfers PT goals addressed during session: Mobility/safety with mobility;Balance OT goals addressed during session: ADL's and self-care                       End of Session Equipment Utilized During Treatment: Oxygen  Activity Tolerance: Patient tolerated treatment well Patient left: in bed;with call bell/phone within reach  OT Visit Diagnosis: History of falling (Z91.81);Muscle weakness (generalized) (M62.81)                Time: 1191-4782 OT Time Calculation (min): 10 min Charges:  OT General Charges $OT Visit: 1 Visit OT Evaluation $OT Eval Low Complexity: 1 Low  Kalynn Declercq OT, MOT  Danie Chandler 03/16/2021, 10:15 AM

## 2021-03-16 NOTE — Evaluation (Signed)
Physical Therapy Evaluation Patient Details Name: LUPIE SAWA MRN: [id number] DOB: 11-12-1970 Today's Date: 03/16/2021   History of Present Illness  Holly Fuentes is a 50 y.o. female with medical history significant for depression, anxiety, PTSD, prior tobacco use who presents to the emergency department due to generalized weakness.  She states that she had a fall on Friday (7/29) and landed on her buttocks without hitting her head, patient states that she went to bed to take a nap after the fall, on waking up, she developed cough (nonproductive), this progressed to increased shortness of breath today and worsening weakness with occasional twitching sensation of her hands or legs.  EMS was activated and patient was taken to the ED for further evaluation and management.  She denies fever, chills, chest pain, nausea, vomiting or abdominal pain.   Clinical Impression  Patient demonstrates good return for ambulation in room and hallway without loss of balance, once SOB patient required hand held assist to walk back to room, on room air with SpO2 dropping from 93% to 85% during ambulation and limited mostly due to SOB.  Patient encouraged to ambulate ad lib in room and pace self to avoid becoming SOB - RN notified.  Plan:  Patient discharged from physical therapy to care of nursing for ambulation daily as tolerated for length of stay.     Follow Up Recommendations Supervision - Intermittent    Equipment Recommendations  None recommended by PT    Recommendations for Other Services       Precautions / Restrictions Precautions Precautions: None Restrictions Weight Bearing Restrictions: No      Mobility  Bed Mobility Overal bed mobility: Modified Independent                  Transfers Overall transfer level: Modified independent                  Ambulation/Gait Ambulation/Gait assistance: Supervision;Min guard Gait Distance (Feet): 100 Feet Assistive device: 1 person  hand held assist Gait Pattern/deviations: Decreased step length - right;Decreased step length - left;Decreased stride length Gait velocity: decreased   General Gait Details: demonstrates good return for ambulation in room and hallway without loss of balance, once SOB required hand held assist to walk back to room  Stairs            Wheelchair Mobility    Modified Rankin (Stroke Patients Only)       Balance Overall balance assessment: No apparent balance deficits (not formally assessed)                                           Pertinent Vitals/Pain Pain Assessment: No/denies pain    Home Living Family/patient expects to be discharged to:: Private residence Living Arrangements: Spouse/significant other Available Help at Discharge: Family;Available 24 hours/day Type of Home: Other(Comment) (Motel) Home Access: Stairs to enter Entrance Stairs-Rails: Right;Left (to wide to reach both) Entrance Stairs-Number of Steps: 12+ Home Layout: Multi-level Home Equipment: None;Other (comment) (patient states she has access to DME)      Prior Function Level of Independence: Independent         Comments: Tourist information centre manager, does not drive     Hand Dominance        Extremity/Trunk Assessment   Upper Extremity Assessment Upper Extremity Assessment: Defer to OT evaluation    Lower Extremity Assessment Lower Extremity  Assessment: Overall WFL for tasks assessed    Cervical / Trunk Assessment Cervical / Trunk Assessment: Normal  Communication   Communication: No difficulties  Cognition Arousal/Alertness: Awake/alert Behavior During Therapy: WFL for tasks assessed/performed Overall Cognitive Status: Within Functional Limits for tasks assessed                                        General Comments      Exercises     Assessment/Plan    PT Assessment Patent does not need any further PT services  PT Problem List         PT  Treatment Interventions      PT Goals (Current goals can be found in the Care Plan section)  Acute Rehab PT Goals Patient Stated Goal: return home with family to assist PT Goal Formulation: With patient Time For Goal Achievement: 03/16/21 Potential to Achieve Goals: Good    Frequency     Barriers to discharge        Co-evaluation PT/OT/SLP Co-Evaluation/Treatment: Yes Reason for Co-Treatment: To address functional/ADL transfers PT goals addressed during session: Mobility/safety with mobility;Balance         AM-PAC PT "6 Clicks" Mobility  Outcome Measure Help needed turning from your back to your side while in a flat bed without using bedrails?: None Help needed moving from lying on your back to sitting on the side of a flat bed without using bedrails?: None Help needed moving to and from a bed to a chair (including a wheelchair)?: None Help needed standing up from a chair using your arms (e.g., wheelchair or bedside chair)?: None Help needed to walk in hospital room?: A Little Help needed climbing 3-5 steps with a railing? : A Little 6 Click Score: 22    End of Session Equipment Utilized During Treatment: Oxygen Activity Tolerance: Patient tolerated treatment well;Patient limited by fatigue Patient left: in bed;with call bell/phone within reach Nurse Communication: Mobility status PT Visit Diagnosis: Unsteadiness on feet (R26.81);Other abnormalities of gait and mobility (R26.89);Muscle weakness (generalized) (M62.81)    Time: 0086-7619 PT Time Calculation (min) (ACUTE ONLY): 20 min   Charges:   PT Evaluation $PT Eval Moderate Complexity: 1 Mod PT Treatments $Therapeutic Activity: 8-22 mins        9:15 AM, 03/16/21 Ocie Bob, MPT Physical Therapist with [facility] [phone] office [phone] mobile phone

## 2021-03-17 LAB — BASIC METABOLIC PANEL
Anion gap: 8 (ref 5–15)
BUN: 18 mg/dL (ref 6–20)
CO2: 34 mmol/L — ABNORMAL HIGH (ref 22–32)
Calcium: 8.8 mg/dL — ABNORMAL LOW (ref 8.9–10.3)
Chloride: 97 mmol/L — ABNORMAL LOW (ref 98–111)
Creatinine, Ser: 0.61 mg/dL (ref 0.44–1.00)
GFR, Estimated: >60 mL/min (ref >60)
Glucose, Bld: 157 mg/dL — ABNORMAL HIGH (ref 70–99)
Potassium: 4.5 mmol/L (ref 3.5–5.1)
Sodium: 139 mmol/L (ref 135–145)

## 2021-03-17 LAB — MAGNESIUM: Magnesium: 2.1 mg/dL (ref 1.7–2.4)

## 2021-03-17 MED ORDER — FUROSEMIDE 10 MG/ML IJ SOLN: 40.0000 mg | Freq: Every day | INTRAMUSCULAR | Status: DC

## 2021-03-17 MED ORDER — FUROSEMIDE 10 MG/ML IJ SOLN
40.0000 mg | Freq: Once | INTRAMUSCULAR | Status: AC
  Administered 2021-03-17: 40 mg via INTRAVENOUS
  Filled 2021-03-17: qty 4

## 2021-03-17 MED ORDER — BUSPIRONE HCL 5 MG PO TABS
5.0000 mg | ORAL_TABLET | Freq: Two times a day (BID) | ORAL | Status: DC
  Administered 2021-03-17 – 2021-03-20 (×6): 5 mg via ORAL
  Filled 2021-03-17 (×6): qty 1

## 2021-03-17 NOTE — Progress Notes (Signed)
PROGRESS NOTE  Holly Fuentes PTW:[id number] DOB: 1971/01/17 DOA: 03/15/2021 PCP: Pcp, No  Brief History:  50 year old with a history of depression/anxiety/tobacco abuse/PTSD presenting with generalized weakness and shortness of breath.  The patient states that she began having shortness of breath on 03/13/2021.  She stated that she had a mechanical fall onto her buttocks on 03/13/2021.  She went to take a nap and woke up with shortness of breath and generalized weakness.  Her shortness of breath progressed over the next 2 days with increasing cough and shortness of breath.  She denies any hemoptysis, fevers, chills, chest pain, nausea, vomiting, diarrhea, abdominal pain.  The patient states that she quit smoking 6 weeks ago after about 20-pack-year history.  She is exposed to significant amount of secondhand smoke.  The patient states that she has had lower extremity edema which has been chronic for the last 6 to 12 months.  She states that is actually a little better than usual.   In the emergency department, the patient was afebrile hemodynamically stable with oxygen saturation 88% on room air.  The patient was placed on 2 L with saturation up to 96%.  She will start on bronchodilators and intravenous steroids.   Assessment/Plan: Acute respiratory failure with hypoxia -Secondary to COPD exacerbation and component pulm edema -Stable on 2 L nasal cannula -Wean oxygen as tolerated for saturation greater 92% -Check BNP--1291 -Personally reviewed chest x-ray--increased interstitial markings -CT chest--ill-defined GGO in upper and lower lobes.  Irregular septal thickening;  left adrenal nodule -lasix 40 mg IV x 1 and re-eval for repeat dosing 8/3 -order Echo   COPD exacerbation -Continue duo nebs -Add Pulmicort -Continue IV Solu-Medrol   Lower extremity edema -Venous duplex--neg   Anxiety/depression -Start BuSpar   Generalized weakness -Likely secondary to respiratory  failure -Check B12--428 -Folic acid--9.9 -TSH--0.486 -PT eval-no follow up   Hyperglycemia/Diabetes Mellitus -Check hemoglobin A1c--6.2 -lifestyle modification for now   Class II obesity -BMI 38.97 -lifestyle modification           Status is: inpatient   The patient will require care spanning > 2 midnights and should be moved to inpatient because: IV treatments appropriate due to intensity of illness or inability to take PO   Dispo: The patient is from: Home              Anticipated d/c is to: Home              Patient currently is not medically stable to d/c.              Difficult to place patient No               Family Communication:  no Family at bedside   Consultants:  none   Code Status:  FULL    DVT Prophylaxis:  [place] Lovenox     Procedures: As Listed in Progress Note Above   Antibiotics: None          Subjective: Patient is breathing better, but still dyspneic with exertion.  Patient denies fevers, chills, headache, chest pain,  nausea, vomiting, diarrhea, abdominal pain, dysuria, hematuria, hematochezia, and melena.   Objective: Vitals:   03/17/21 0530 03/17/21 0725 03/17/21 1353 03/17/21 1436  BP: 140/77  138/72   Pulse: 82  80   Resp: 18  18   Temp: 97.7 F (36.5 C)     TempSrc: Oral  Oral  SpO2: 97% 98% 98% 97%  Weight:      Height:        Intake/Output Summary (Last 24 hours) at 03/17/2021 1731 Last data filed at 03/17/2021 1300 Gross per 24 hour  Intake 1640 ml  Output --  Net 1640 ml   Weight change:  Exam:  General:  Pt is alert, follows commands appropriately, not in acute distress HEENT: No icterus, No thrush, No neck mass, [place]/AT Cardiovascular: RRR, S1/S2, no rubs, no gallops Respiratory: bibasilar wheeze.  Diminished BS.  Bibasilar rales Abdomen: Soft/+BS, non tender, non distended, no guarding Extremities: trace LE edema, No lymphangitis, No petechiae, No rashes, no synovitis   Data Reviewed: I have personally  reviewed following labs and imaging studies Basic Metabolic Panel: Recent Labs  Lab 03/15/21 1815 03/16/21 1000 03/16/21 1431 03/17/21 0631  NA 138  --  135 139  K 4.1  --  4.2 4.5  CL 96*  --  95* 97*  CO2 30  --  31 34*  GLUCOSE 123*  --  167* 157*  BUN 16  --  17 18  CREATININE 0.70  --  0.53 0.61  CALCIUM 8.5*  --  8.2* 8.8*  MG  --  1.9 1.9 2.1  PHOS  --   --  3.2  --    Liver Function Tests: Recent Labs  Lab 03/16/21 1431  AST 27  ALT 19  ALKPHOS 94  BILITOT 1.4*  PROT 7.9  ALBUMIN 3.3*   No results for input(s): LIPASE, AMYLASE in the last 168 hours. No results for input(s): AMMONIA in the last 168 hours. Coagulation Profile: Recent Labs  Lab 03/16/21 1431  INR 1.2   CBC: Recent Labs  Lab 03/15/21 1815 03/16/21 1431  WBC 11.3* 9.7  HGB 15.0 15.0  HCT 49.3* 48.6*  MCV 92.8 92.7  PLT 324 285   Cardiac Enzymes: No results for input(s): CKTOTAL, CKMB, CKMBINDEX, TROPONINI in the last 168 hours. BNP: Invalid input(s): POCBNP CBG: No results for input(s): GLUCAP in the last 168 hours. HbA1C: Recent Labs    03/16/21 1000  HGBA1C 6.2*   Urine analysis:    Component Value Date/Time   COLORURINE YELLOW 08/18/2011 1320   APPEARANCEUR HAZY (A) 08/18/2011 1320   LABSPEC >1.030 (H) 08/18/2011 1320   PHURINE 5.5 08/18/2011 1320   GLUCOSEU 100 (A) 08/18/2011 1320   HGBUR LARGE (A) 08/18/2011 1320   BILIRUBINUR NEGATIVE 08/18/2011 1320   KETONESUR NEGATIVE 08/18/2011 1320   PROTEINUR 100 (A) 08/18/2011 1320   UROBILINOGEN 2.0 (H) 08/18/2011 1320   NITRITE POSITIVE (A) 08/18/2011 1320   LEUKOCYTESUR NEGATIVE 08/18/2011 1320   Sepsis Labs: @LABRCNTIP (procalcitonin:4,lacticidven:4) ) Recent Results (from the past 240 hour(s))  Resp Panel by RT-PCR (Flu A&B, Covid) Nasopharyngeal Swab     Status: None   Collection Time: 03/15/21  9:45 PM   Specimen: Nasopharyngeal Swab; Nasopharyngeal(NP) swabs in vial transport medium  Result Value Ref Range  Status   SARS Coronavirus 2 by RT PCR NEGATIVE NEGATIVE Final    Comment: (NOTE) SARS-CoV-2 target nucleic acids are NOT DETECTED.  The SARS-CoV-2 RNA is generally detectable in upper respiratory specimens during the acute phase of infection. The lowest concentration of SARS-CoV-2 viral copies this assay can detect is 138 copies/mL. A negative result does not preclude SARS-Cov-2 infection and should not be used as the sole basis for treatment or other patient management decisions. A negative result may occur with  improper specimen collection/handling, submission of specimen other than  nasopharyngeal swab, presence of viral mutation(s) within the areas targeted by this assay, and inadequate number of viral copies(<138 copies/mL). A negative result must be combined with clinical observations, patient history, and epidemiological information. The expected result is Negative.  Fact Sheet for Patients:  BloggerCourse.com  Fact Sheet for Healthcare Providers:  SeriousBroker.it  This test is no t yet approved or cleared by the [place] FDA and  has been authorized for detection and/or diagnosis of SARS-CoV-2 by FDA under an Emergency Use Authorization (EUA). This EUA will remain  in effect (meaning this test can be used) for the duration of the COVID-19 declaration under Section 564(b)(1) of the Act, 21 U.S.C.section 360bbb-3(b)(1), unless the authorization is terminated  or revoked sooner.       Influenza A by PCR NEGATIVE NEGATIVE Final   Influenza B by PCR NEGATIVE NEGATIVE Final    Comment: (NOTE) The Xpert Xpress SARS-CoV-2/FLU/RSV plus assay is intended as an aid in the diagnosis of influenza from Nasopharyngeal swab specimens and should not be used as a sole basis for treatment. Nasal washings and aspirates are unacceptable for Xpert Xpress SARS-CoV-2/FLU/RSV testing.  Fact Sheet for  Patients: BloggerCourse.com  Fact Sheet for Healthcare Providers: SeriousBroker.it  This test is not yet approved or cleared by the [place] FDA and has been authorized for detection and/or diagnosis of SARS-CoV-2 by FDA under an Emergency Use Authorization (EUA). This EUA will remain in effect (meaning this test can be used) for the duration of the COVID-19 declaration under Section 564(b)(1) of the Act, 21 U.S.C. section 360bbb-3(b)(1), unless the authorization is terminated or revoked.  Performed at [facility], [address], [place], [place] [address]      Scheduled Meds:  budesonide (PULMICORT) nebulizer solution  0.5 mg Nebulization BID   dextromethorphan-guaiFENesin  1 tablet Oral BID   enoxaparin (LOVENOX) injection  40 mg Subcutaneous Q24H   furosemide  40 mg Intravenous Once   ipratropium-albuterol  3 mL Nebulization Q6H   methylPREDNISolone (SOLU-MEDROL) injection  80 mg Intravenous Q12H   pantoprazole  40 mg Oral Daily   polyethylene glycol  17 g Oral Daily   senna  2 tablet Oral Daily   Continuous Infusions:  Procedures/Studies: DG Chest 2 View  Result Date: 03/15/2021 CLINICAL DATA:  Weakness EXAM: CHEST - 2 VIEW COMPARISON:  05/15/2004 FINDINGS: The lungs are symmetrically well expanded. There is mild central bronchial wall thickening in keeping with probable airway inflammation. No pneumothorax or pleural effusion. No confluent pulmonary infiltrate. Cardiac size within normal limits. No acute bone abnormality. IMPRESSION: Mild bronchitic changes noted.  No confluent pulmonary infiltrate. Electronically Signed   By: Helyn Numbers MD   On: 03/15/2021 19:21   CT CHEST WO CONTRAST  Result Date: 03/16/2021 CLINICAL DATA:  Dyspnea, chronic dyspnea of unclear etiology in a patient with history of COPD. EXAM: CT CHEST WITHOUT CONTRAST TECHNIQUE: Multidetector CT imaging of the chest was performed following the  standard protocol without IV contrast. COMPARISON:  Chest x-ray from March 15, 2021. FINDINGS: Cardiovascular: Calcified atheromatous plaque, mild in the thoracic aorta. No aneurysmal dilation. Main pulmonary artery 3.8 cm. Heart size normal without substantial pericardial effusion. Mediastinum/Nodes: Esophagus grossly normal. No gross hilar adenopathy. No mediastinal adenopathy. Scattered lymph nodes throughout the chest without pathologic enlargement. Lungs/Pleura: Diffuse mild septal thickening, basilar atelectasis and areas of ground-glass. Multinodular appearance of lung parenchyma. Numerous ill-defined ground-glass nodules throughout upper and lower lobes. Septal thickening is mildly irregular. No effusion or consolidation. LEFT lower  lobe nodule 6 mm (image 105/4) (Image 67/4) 10 mm ground-glass nodule in the RIGHT upper lobe. Innumerable other scattered pulmonary nodules throughout the chest. Upper Abdomen: Limited assessment of upper abdominal contents showing no acute process. Nodule adjacent to the LEFT adrenal and stomach is ovoid measuring 13 mm. Isodense to the spleen. This is immediately adjacent to the LEFT adrenal. No upper abdominal lymphadenopathy. Musculoskeletal: No acute musculoskeletal process. Spinal degenerative changes. No destructive bone finding. IMPRESSION: 1. Numerous ill-defined ground-glass nodules throughout upper and lower lobes. Septal thickening is mildly irregular. Findings may represent sequela of atypical infection. This is also seen on a background of mildly irregular septal thickening. Differential considerations would include metastatic disease to the chest and respiratory bronchiolitis. Pulmonary consultation may be helpful, close follow-up is suggested. 2. Given the presence of septal thickening would also correlate with any clinical or laboratory evidence of heart failure in the background. 3. Scattered mildly prominent lymph nodes in the chest may be reactive, attention  on follow-up. 4. Nodule adjacent the LEFT adrenal favored to represent a small splenule. Consider follow-up dedicated abdominal imaging. 5. Main pulmonary artery 3.8 cm, can be seen in the setting of pulmonary arterial hypertension. 6. Aortic atherosclerosis. Aortic Atherosclerosis (ICD10-I70.0). Electronically Signed   By: Donzetta KohutGeoffrey  Wile M.D.   On: 03/16/2021 11:44   US Venous Img Lower Bilateral (DVT)  Result Date: 03/16/2021 CLINICAL DATA:  50 year old female with and intermittent bilateral lower extremity edema EXAM: BILATERAL LOWER EXTREMITY VENOUS DOPPLER ULTRASOUND TECHNIQUE: Gray-scale sonography with compression, as well as color and duplex ultrasound, were performed to evaluate the deep venous system(s) from the level of the common femoral vein through the popliteal and proximal calf veins. COMPARISON:  None. FINDINGS: VENOUS Normal compressibility of the common femoral, superficial femoral, and popliteal veins, as well as the visualized calf veins. Visualized portions of profunda femoral vein and great saphenous vein unremarkable. No filling defects to suggest DVT on grayscale or color Doppler imaging. Doppler waveforms show normal direction of venous flow, normal respiratory plasticity and response to augmentation. OTHER Diffuse bilateral soft tissue edema. Limitations: none IMPRESSION: No femoropopliteal DVT nor evidence of DVT within the visualized calf veins of either lower extremity. If clinical symptoms are inconsistent or if there are persistent or worsening symptoms, further imaging (possibly involving the iliac veins) may be warranted. Roanna BanningJon Mugweru, MD [facility] Electronically Signed   By: Roanna BanningJon  Mugweru MD   On: 03/16/2021 09:55    Catarina Hartshornavid Tron Flythe, DO  [facility]  If 7PM-7AM, please contact night-coverage [email or web address] Password [email or web address] 03/17/2021, 5:31 PM   LOS: 1 day

## 2021-03-17 NOTE — TOC Initial Note (Signed)
Transition of Care [facility]) - Initial/Assessment Note    Patient Details  Name: Holly Fuentes MRN: [id number] Date of Birth: [birth year]  [facility] CM/SW Contact:    Leitha Bleak, RN Phone Number: 03/17/2021, 3:22 PM  Clinical Narrative:    Patient has no PCP and  no insurance. Referred patient to [facility]. Copy provided to patient.      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) Does the patient have difficulty seeing, even when wearing glasses/contacts?: No Does the patient have difficulty concentrating, remembering, or making decisions?: No Patient able to express need for assistance with ADLs?: Yes Does the patient have difficulty dressing or bathing?: No Independently performs ADLs?: Yes (appropriate for developmental age) Does the patient have difficulty walking or climbing stairs?: No Weakness of Legs: None Weakness of Arms/Hands: None  Permission Sought/Granted    Emotional Assessment     Admission diagnosis:  Leg edema [R60.0] Bronchitis [J40] Acute respiratory failure with hypoxia (HCC) [J96.01] Patient Active Problem List   Diagnosis Date Noted   Acute bronchitis 03/16/2021   Generalized weakness 03/16/2021   Fall at home, initial encounter 03/16/2021   Leukocytosis 03/16/2021   Hyperglycemia 03/16/2021   Anxiety 03/16/2021   Depression 03/16/2021   Dehydration 03/16/2021   COPD with acute exacerbation (HCC)  03/16/2021   Acute respiratory failure with hypoxia (HCC) 03/15/2021   MDD (major depressive disorder), recurrent episode, severe (HCC) 11/01/2013   OTHER CHRONIC PAIN 04/27/2010   HIP, ARTHRITIS, DEGEN./OSTEO 04/27/2010   HIP PAIN 04/27/2010   DEGENERATIVE DISC DISEASE, LUMBOSACRAL SPINE W/RADICULOPATHY 04/27/2010   PCP:  Pcp, No Pharmacy:   [id number] - [place], [place] - [address] AT [facility] [address]  [place] [address] Phone: [phone] Fax: [phone]  [id number] - [place], [place] - [address] AT [facility] [address] [place] [address] Phone: [phone] Fax: [phone]   Readmission Risk Interventions Readmission Risk Prevention Plan 03/17/2021  Post Dischage Appt Complete  Medication Screening Complete  Transportation Screening Complete  Some recent data might be hidden

## 2021-03-17 NOTE — Plan of Care (Signed)
  Problem: Education: Goal: Knowledge of General Education information will improve Description Including pain rating scale, medication(s)/side effects and non-pharmacologic comfort measures Outcome: Progressing   

## 2021-03-18 ENCOUNTER — Other Ambulatory Visit (HOSPITAL_COMMUNITY): Payer: Self-pay

## 2021-03-18 DIAGNOSIS — R531 Weakness: Secondary | ICD-10-CM

## 2021-03-18 DIAGNOSIS — E860 Dehydration: Secondary | ICD-10-CM

## 2021-03-18 DIAGNOSIS — J441 Chronic obstructive pulmonary disease with (acute) exacerbation: Principal | ICD-10-CM

## 2021-03-18 LAB — BASIC METABOLIC PANEL
Anion gap: 11 (ref 5–15)
BUN: 21 mg/dL — ABNORMAL HIGH (ref 6–20)
CO2: 34 mmol/L — ABNORMAL HIGH (ref 22–32)
Calcium: 8.9 mg/dL (ref 8.9–10.3)
Chloride: 93 mmol/L — ABNORMAL LOW (ref 98–111)
Creatinine, Ser: 0.7 mg/dL (ref 0.44–1.00)
GFR, Estimated: >60 mL/min (ref >60)
Glucose, Bld: 142 mg/dL — ABNORMAL HIGH (ref 70–99)
Potassium: 4.7 mmol/L (ref 3.5–5.1)
Sodium: 138 mmol/L (ref 135–145)

## 2021-03-18 LAB — MAGNESIUM: Magnesium: 2.1 mg/dL (ref 1.7–2.4)

## 2021-03-18 MED ORDER — FUROSEMIDE 10 MG/ML IJ SOLN
20.0000 mg | Freq: Once | INTRAMUSCULAR | Status: AC
  Administered 2021-03-18: 20 mg via INTRAVENOUS
  Filled 2021-03-18: qty 2

## 2021-03-18 MED ORDER — LIVING BETTER WITH HEART FAILURE BOOK: Freq: Once | Status: DC

## 2021-03-18 NOTE — Progress Notes (Signed)
PROGRESS NOTE  Holly KocherSusan R Fuentes ZOX:[id number] DOB: [birth year] DOA: 03/15/2021 PCP: Pcp, No  Brief History:  50 year old with a history of depression/anxiety/tobacco abuse/PTSD presenting with generalized weakness and shortness of breath.  The patient states that she began having shortness of breath on 03/13/2021.  She stated that she had a mechanical fall onto her buttocks on 03/13/2021.  She went to take a nap and woke up with shortness of breath and generalized weakness.  Her shortness of breath progressed over the next 2 days with increasing cough and shortness of breath.  She denies any hemoptysis, fevers, chills, chest pain, nausea, vomiting, diarrhea, abdominal pain.  The patient states that she quit smoking 6 weeks ago after about 20-pack-year history.  She is exposed to significant amount of secondhand smoke.  The patient states that she has had lower extremity edema which has been chronic for the last 6 to 12 months.  She states that is actually a little better than usual.   In the emergency department, the patient was afebrile hemodynamically stable with oxygen saturation 88% on room air.  The patient was placed on 2 L with saturation up to 96%.  She will start on bronchodilators and intravenous steroids.   Assessment/Plan: Acute respiratory failure with hypoxia -Secondary to COPD exacerbation and component pulm edema -Stable on 2 L nasal cannula, wean off oxygen as tolerated -Wean oxygen as tolerated for saturation greater 92% -Check BNP--1291 -Personally reviewed chest x-ray--increased interstitial markings -CT chest--ill-defined GGO in upper and lower lobes.  Irregular septal thickening;  left adrenal nodule -lasix 40 mg IV x 1 on 8/2 -Still has some evidence of rales, will give another dose of Lasix today -Echo ordered, pending   COPD exacerbation -Continue duo nebs -Add Pulmicort -Continue IV Solu-Medrol   Lower extremity edema -Venous duplex--neg    Anxiety/depression -Start BuSpar   Generalized weakness -Likely secondary to respiratory failure -Check B12--428 -Folic acid--9.9 -TSH--0.486 -PT eval-no follow up   Hyperglycemia/Diabetes Mellitus -Check hemoglobin A1c--6.2 -lifestyle modification for now   Class II obesity -BMI 38.97 -lifestyle modification           Status is: inpatient   The patient will require care spanning > 2 midnights and should be moved to inpatient because: IV treatments appropriate due to intensity of illness or inability to take PO   Dispo: The patient is from: Home              Anticipated d/c is to: Home              Patient currently is not medically stable to d/c.              Difficult to place patient No               Family Communication:  no Family at bedside   Consultants:  none   Code Status:  FULL    DVT Prophylaxis:   Lovenox     Procedures: As Listed in Progress Note Above   Antibiotics: None          Subjective: Feels that her breathing is slowly improving.  Continues to have some wheezing and cough.  Reports good urine output after receiving Lasix yesterday.  Still on 3 L of oxygen.   Objective: Vitals:   03/18/21 0554 03/18/21 0730 03/18/21 1301 03/18/21 1331  BP: (!) 144/96  137/77   Pulse: 78  89   Resp: 18  19   Temp: 98.4 F (36.9 C)  98.8 F (37.1 C)   TempSrc: Oral  Oral   SpO2: 96% 95% 95% 96%  Weight:      Height:       No intake or output data in the 24 hours ending 03/18/21 1846  Weight change:  Exam:  General exam: Alert, awake, oriented x 3 Respiratory system: Mild wheeze with occasional rales. Respiratory effort normal. Cardiovascular system:RRR. No murmurs, rubs, gallops. Gastrointestinal system: Abdomen is nondistended, soft and nontender. No organomegaly or masses felt. Normal bowel sounds heard. Central nervous system: Alert and oriented. No focal neurological deficits. Extremities: No C/C/E, +pedal pulses Skin: No  rashes, lesions or ulcers Psychiatry: Judgement and insight appear normal. Mood & affect appropriate.     Data Reviewed: I have personally reviewed following labs and imaging studies Basic Metabolic Panel: Recent Labs  Lab 03/15/21 1815 03/16/21 1000 03/16/21 1431 03/17/21 0631 03/18/21 0603  NA 138  --  135 139 138  K 4.1  --  4.2 4.5 4.7  CL 96*  --  95* 97* 93*  CO2 30  --  31 34* 34*  GLUCOSE 123*  --  167* 157* 142*  BUN 16  --  17 18 21*  CREATININE 0.70  --  0.53 0.61 0.70  CALCIUM 8.5*  --  8.2* 8.8* 8.9  MG  --  1.9 1.9 2.1 2.1  PHOS  --   --  3.2  --   --    Liver Function Tests: Recent Labs  Lab 03/16/21 1431  AST 27  ALT 19  ALKPHOS 94  BILITOT 1.4*  PROT 7.9  ALBUMIN 3.3*   No results for input(s): LIPASE, AMYLASE in the last 168 hours. No results for input(s): AMMONIA in the last 168 hours. Coagulation Profile: Recent Labs  Lab 03/16/21 1431  INR 1.2   CBC: Recent Labs  Lab 03/15/21 1815 03/16/21 1431  WBC 11.3* 9.7  HGB 15.0 15.0  HCT 49.3* 48.6*  MCV 92.8 92.7  PLT 324 285   Cardiac Enzymes: No results for input(s): CKTOTAL, CKMB, CKMBINDEX, TROPONINI in the last 168 hours. BNP: Invalid input(s): POCBNP CBG: No results for input(s): GLUCAP in the last 168 hours. HbA1C: Recent Labs    03/16/21 1000  HGBA1C 6.2*   Urine analysis:    Component Value Date/Time   COLORURINE YELLOW 08/18/2011 1320   APPEARANCEUR HAZY (A) 08/18/2011 1320   LABSPEC >1.030 (H) 08/18/2011 1320   PHURINE 5.5 08/18/2011 1320   GLUCOSEU 100 (A) 08/18/2011 1320   HGBUR LARGE (A) 08/18/2011 1320   BILIRUBINUR NEGATIVE 08/18/2011 1320   KETONESUR NEGATIVE 08/18/2011 1320   PROTEINUR 100 (A) 08/18/2011 1320   UROBILINOGEN 2.0 (H) 08/18/2011 1320   NITRITE POSITIVE (A) 08/18/2011 1320   LEUKOCYTESUR NEGATIVE 08/18/2011 1320   Sepsis Labs: @LABRCNTIP (procalcitonin:4,lacticidven:4) ) Recent Results (from the past 240 hour(s))  Resp Panel by RT-PCR  (Flu A&B, Covid) Nasopharyngeal Swab     Status: None   Collection Time: 03/15/21  9:45 PM   Specimen: Nasopharyngeal Swab; Nasopharyngeal(NP) swabs in vial transport medium  Result Value Ref Range Status   SARS Coronavirus 2 by RT PCR NEGATIVE NEGATIVE Final    Comment: (NOTE) SARS-CoV-2 target nucleic acids are NOT DETECTED.  The SARS-CoV-2 RNA is generally detectable in upper respiratory specimens during the acute phase of infection. The lowest concentration of SARS-CoV-2 viral copies this assay can detect is 138 copies/mL. A negative result does not preclude SARS-Cov-2  infection and should not be used as the sole basis for treatment or other patient management decisions. A negative result may occur with  improper specimen collection/handling, submission of specimen other than nasopharyngeal swab, presence of viral mutation(s) within the areas targeted by this assay, and inadequate number of viral copies(<138 copies/mL). A negative result must be combined with clinical observations, patient history, and epidemiological information. The expected result is Negative.  Fact Sheet for Patients:  BloggerCourse.com  Fact Sheet for Healthcare Providers:  SeriousBroker.it  This test is no t yet approved or cleared by the [place] FDA and  has been authorized for detection and/or diagnosis of SARS-CoV-2 by FDA under an Emergency Use Authorization (EUA). This EUA will remain  in effect (meaning this test can be used) for the duration of the COVID-19 declaration under Section 564(b)(1) of the Act, 21 U.S.C.section 360bbb-3(b)(1), unless the authorization is terminated  or revoked sooner.       Influenza A by PCR NEGATIVE NEGATIVE Final   Influenza B by PCR NEGATIVE NEGATIVE Final    Comment: (NOTE) The Xpert Xpress SARS-CoV-2/FLU/RSV plus assay is intended as an aid in the diagnosis of influenza from Nasopharyngeal swab specimens  and should not be used as a sole basis for treatment. Nasal washings and aspirates are unacceptable for Xpert Xpress SARS-CoV-2/FLU/RSV testing.  Fact Sheet for Patients: BloggerCourse.com  Fact Sheet for Healthcare Providers: SeriousBroker.it  This test is not yet approved or cleared by the [place] FDA and has been authorized for detection and/or diagnosis of SARS-CoV-2 by FDA under an Emergency Use Authorization (EUA). This EUA will remain in effect (meaning this test can be used) for the duration of the COVID-19 declaration under Section 564(b)(1) of the Act, 21 U.S.C. section 360bbb-3(b)(1), unless the authorization is terminated or revoked.  Performed at [facility], [address] [place], [place] [address]      Scheduled Meds:  budesonide (PULMICORT) nebulizer solution  0.5 mg Nebulization BID   busPIRone  5 mg Oral BID   enoxaparin (LOVENOX) injection  40 mg Subcutaneous Q24H   ipratropium-albuterol  3 mL Nebulization Q6H   Living Better with Heart Failure Book   Does not apply Once   methylPREDNISolone (SOLU-MEDROL) injection  80 mg Intravenous Q12H   pantoprazole  40 mg Oral Daily   polyethylene glycol  17 g Oral Daily   senna  2 tablet Oral Daily   Continuous Infusions:  Procedures/Studies: DG Chest 2 View  Result Date: 03/15/2021 CLINICAL DATA:  Weakness EXAM: CHEST - 2 VIEW COMPARISON:  05/15/2004 FINDINGS: The lungs are symmetrically well expanded. There is mild central bronchial wall thickening in keeping with probable airway inflammation. No pneumothorax or pleural effusion. No confluent pulmonary infiltrate. Cardiac size within normal limits. No acute bone abnormality. IMPRESSION: Mild bronchitic changes noted.  No confluent pulmonary infiltrate. Electronically Signed   By: Helyn Numbers MD   On: 03/15/2021 19:21   CT CHEST WO CONTRAST  Result Date: 03/16/2021 CLINICAL DATA:  Dyspnea, chronic dyspnea  of unclear etiology in a patient with history of COPD. EXAM: CT CHEST WITHOUT CONTRAST TECHNIQUE: Multidetector CT imaging of the chest was performed following the standard protocol without IV contrast. COMPARISON:  Chest x-ray from March 15, 2021. FINDINGS: Cardiovascular: Calcified atheromatous plaque, mild in the thoracic aorta. No aneurysmal dilation. Main pulmonary artery 3.8 cm. Heart size normal without substantial pericardial effusion. Mediastinum/Nodes: Esophagus grossly normal. No gross hilar adenopathy. No mediastinal adenopathy. Scattered lymph nodes throughout the chest without  pathologic enlargement. Lungs/Pleura: Diffuse mild septal thickening, basilar atelectasis and areas of ground-glass. Multinodular appearance of lung parenchyma. Numerous ill-defined ground-glass nodules throughout upper and lower lobes. Septal thickening is mildly irregular. No effusion or consolidation. LEFT lower lobe nodule 6 mm (image 105/4) (Image 67/4) 10 mm ground-glass nodule in the RIGHT upper lobe. Innumerable other scattered pulmonary nodules throughout the chest. Upper Abdomen: Limited assessment of upper abdominal contents showing no acute process. Nodule adjacent to the LEFT adrenal and stomach is ovoid measuring 13 mm. Isodense to the spleen. This is immediately adjacent to the LEFT adrenal. No upper abdominal lymphadenopathy. Musculoskeletal: No acute musculoskeletal process. Spinal degenerative changes. No destructive bone finding. IMPRESSION: 1. Numerous ill-defined ground-glass nodules throughout upper and lower lobes. Septal thickening is mildly irregular. Findings may represent sequela of atypical infection. This is also seen on a background of mildly irregular septal thickening. Differential considerations would include metastatic disease to the chest and respiratory bronchiolitis. Pulmonary consultation may be helpful, close follow-up is suggested. 2. Given the presence of septal thickening would also  correlate with any clinical or laboratory evidence of heart failure in the background. 3. Scattered mildly prominent lymph nodes in the chest may be reactive, attention on follow-up. 4. Nodule adjacent the LEFT adrenal favored to represent a small splenule. Consider follow-up dedicated abdominal imaging. 5. Main pulmonary artery 3.8 cm, can be seen in the setting of pulmonary arterial hypertension. 6. Aortic atherosclerosis. Aortic Atherosclerosis (ICD10-I70.0). Electronically Signed   By: Donzetta Kohut M.D.   On: 03/16/2021 11:44   US Venous Img Lower Bilateral (DVT)  Result Date: 03/16/2021 CLINICAL DATA:  50 year old female with and intermittent bilateral lower extremity edema EXAM: BILATERAL LOWER EXTREMITY VENOUS DOPPLER ULTRASOUND TECHNIQUE: Gray-scale sonography with compression, as well as color and duplex ultrasound, were performed to evaluate the deep venous system(s) from the level of the common femoral vein through the popliteal and proximal calf veins. COMPARISON:  None. FINDINGS: VENOUS Normal compressibility of the common femoral, superficial femoral, and popliteal veins, as well as the visualized calf veins. Visualized portions of profunda femoral vein and great saphenous vein unremarkable. No filling defects to suggest DVT on grayscale or color Doppler imaging. Doppler waveforms show normal direction of venous flow, normal respiratory plasticity and response to augmentation. OTHER Diffuse bilateral soft tissue edema. Limitations: none IMPRESSION: No femoropopliteal DVT nor evidence of DVT within the visualized calf veins of either lower extremity. If clinical symptoms are inconsistent or if there are persistent or worsening symptoms, further imaging (possibly involving the iliac veins) may be warranted. Roanna Banning, MD [facility] Electronically Signed   By: Roanna Banning MD   On: 03/16/2021 09:55    Erick Blinks, MD  [facility]  If 7PM-7AM, please contact night-coverage [email or web address]  03/18/2021, 6:46 PM   LOS: 2 days

## 2021-03-18 NOTE — Progress Notes (Signed)
Received report from [place], [place]. Assumed pt care at 0700. Pt resting comfortably in bed in no acute distress.  A&O. O2 @ 2L/[place], saturating 91%, chronic home O2. Tolerating diet. IS and flutter valve at bedside, pt independently performs. Denies pain. Call bell within reach. Will continue to monitor.

## 2021-03-18 NOTE — Plan of Care (Signed)
  Problem: Education: Goal: Knowledge of General Education information will improve Description: Including pain rating scale, medication(s)/side effects and non-pharmacologic comfort measures Outcome: Progressing   Problem: Health Behavior/Discharge Planning: Goal: Ability to manage health-related needs will improve Outcome: Progressing   Problem: Activity: Goal: Risk for activity intolerance will decrease Outcome: Progressing   

## 2021-03-18 NOTE — TOC Progression Note (Signed)
Transition of Care [facility]) - Progression Note    Patient Details  Name: Holly Fuentes MRN: [id number] Date of Birth: [birth year]  Transition of Care [facility]) CM/SW Contact  Leitha Bleak, RN Phone Number: 03/18/2021, 2:34 PM  Clinical Narrative:   [facility] consulted for CHF. Living better book ordered and explained. Patient is very interested in the book and having a flowsheet. She does not do daily weight. She tries to eat healthy and does not eat salt.  [facility] did call to set up a PCP. Advised patient to take her book and question to her first appointment.   Expected Discharge Plan: Home/Self Care Barriers to Discharge: Continued Medical Work up  Expected Discharge Plan and Services Expected Discharge Plan: Home/Self Care      Readmission Risk Interventions Readmission Risk Prevention Plan 03/17/2021  Post Dischage Appt Complete  Medication Screening Complete  Transportation Screening Complete  Some recent data might be hidden

## 2021-03-19 ENCOUNTER — Inpatient Hospital Stay (HOSPITAL_COMMUNITY): Payer: Self-pay

## 2021-03-19 DIAGNOSIS — I5031 Acute diastolic (congestive) heart failure: Secondary | ICD-10-CM

## 2021-03-19 LAB — BASIC METABOLIC PANEL
Anion gap: 11 (ref 5–15)
BUN: 22 mg/dL — ABNORMAL HIGH (ref 6–20)
CO2: 34 mmol/L — ABNORMAL HIGH (ref 22–32)
Calcium: 8.9 mg/dL (ref 8.9–10.3)
Chloride: 94 mmol/L — ABNORMAL LOW (ref 98–111)
Creatinine, Ser: 0.7 mg/dL (ref 0.44–1.00)
GFR, Estimated: >60 mL/min (ref >60)
Glucose, Bld: 141 mg/dL — ABNORMAL HIGH (ref 70–99)
Potassium: 4.2 mmol/L (ref 3.5–5.1)
Sodium: 139 mmol/L (ref 135–145)

## 2021-03-19 LAB — ECHOCARDIOGRAM COMPLETE
AR max vel: 2.25 cm2
AV Area VTI: 2.12 cm2
AV Area mean vel: 2.14 cm2
AV Mean grad: 4 mmHg
AV Peak grad: 7.5 mmHg
Ao pk vel: 1.37 m/s
Area-P 1/2: 4.39 cm2
Calc EF: 36.5 %
Height: 63 in
MV VTI: 2.26 cm2
S' Lateral: 3.5 cm
Single Plane A2C EF: 34.3 %
Single Plane A4C EF: 40.7 %
Weight: 3520 oz

## 2021-03-19 MED ORDER — PANTOPRAZOLE SODIUM 40 MG PO TBEC
40.0000 mg | DELAYED_RELEASE_TABLET | Freq: Every day | ORAL | 0 refills | Status: DC
Start: 2021-03-20 — End: 2022-03-03

## 2021-03-19 MED ORDER — FUROSEMIDE 20 MG PO TABS: 20.0000 mg | ORAL_TABLET | Freq: Every day | ORAL | 11 refills | Status: DC

## 2021-03-19 MED ORDER — PREDNISONE 10 MG PO TABS
ORAL_TABLET | ORAL | 0 refills | Status: DC
Start: 2021-03-19 — End: 2021-04-10

## 2021-03-19 MED ORDER — ALBUTEROL SULFATE HFA 108 (90 BASE) MCG/ACT IN AERS: 2.0000 | INHALATION_SPRAY | Freq: Four times a day (QID) | RESPIRATORY_TRACT | 2 refills | Status: DC | PRN

## 2021-03-19 MED ORDER — BUDESONIDE-FORMOTEROL FUMARATE 80-4.5 MCG/ACT IN AERO: 2.0000 | INHALATION_SPRAY | Freq: Two times a day (BID) | RESPIRATORY_TRACT | 12 refills | Status: DC

## 2021-03-19 MED ORDER — BUSPIRONE HCL 7.5 MG PO TABS: 7.5000 mg | ORAL_TABLET | Freq: Two times a day (BID) | ORAL | 0 refills | Status: DC

## 2021-03-19 MED ORDER — IPRATROPIUM-ALBUTEROL 0.5-2.5 (3) MG/3ML IN SOLN
3.0000 mL | Freq: Two times a day (BID) | RESPIRATORY_TRACT | Status: DC
  Administered 2021-03-19: 3 mL via RESPIRATORY_TRACT
  Filled 2021-03-19: qty 3

## 2021-03-19 MED ORDER — PREDNISONE 20 MG PO TABS
40.0000 mg | ORAL_TABLET | Freq: Every day | ORAL | Status: DC
  Administered 2021-03-20: 40 mg via ORAL
  Filled 2021-03-19: qty 2

## 2021-03-19 NOTE — Progress Notes (Signed)
IV's removed and discharge instructions reviewed.  Scripts sent to pharmacy and voucher for meds given.  Calling family for ride home.

## 2021-03-19 NOTE — Evaluation (Signed)
Clinical/Bedside Swallow Evaluation Patient Details  Name: Holly Fuentes MRN: [id number] Date of Birth: [birth year]  Today's Date: 03/19/2021 Time: SLP Start Time (ACUTE ONLY): 0907 SLP Stop Time (ACUTE ONLY): 0930 SLP Time Calculation (min) (ACUTE ONLY): 23 min  Past Medical History:  Past Medical History:  Diagnosis Date   Anxiety    Depression    PTSD (post-traumatic stress disorder)    Past Surgical History:  Past Surgical History:  Procedure Laterality Date   BACK SURGERY     CESAREAN SECTION     HPI:  50 year old with a history of depression/anxiety/tobacco abuse/PTSD presenting with generalized weakness and shortness of breath.  The patient states that she began having shortness of breath on 03/13/2021.  She stated that she had a mechanical fall onto her buttocks on 03/13/2021.  She went to take a nap and woke up with shortness of breath and generalized weakness.  Her shortness of breath progressed over the next 2 days with increasing cough and shortness of breath.  She denies any hemoptysis, fevers, chills, chest pain, nausea, vomiting, diarrhea, abdominal pain.  The patient states that she quit smoking 6 weeks ago after about 20-pack-year history.  She is exposed to significant amount of secondhand smoke.  The patient states that she has had lower extremity edema which has been chronic for the last 6 to 12 months.  She states that is actually a little better than usual. Pt reported globus sensation midchest with solid foods and BSE requested.   Assessment / Plan / Recommendation Clinical Impression  Clinical swallow evaluation completed at bedside. Oral motor examination is WNL, no facial asymmetry. Pt reports recent globus sensation with solid foods and points to both neck and mid chest as source. She indicates that she quit smoking ~6 weeks ago and was told that she has COPD. Pt had trouble swallowing her pancakes this AM. She shows no oropharyngeal symptoms, however she may have  esophageal dysphagia given reports of globus with some solids (ok with pills). SLP provided Pt with written esophageal swallowing precautions and information regarding coordinating respiratory and swallow systepms in those with COPD. If Pt's symptoms persist, consider Barium pill esophagram and/or GI consult, however Pt reports symptoms are inconsistent and started a few weeks ago. Pt in agreement with plan of care. Recommend regular textures and thin liquids with standard aspiration and reflux precations. SLP will sign off. SLP Visit Diagnosis: Dysphagia, unspecified (R13.10)    Aspiration Risk  No limitations    Diet Recommendation Regular;Thin liquid   Liquid Administration via: Cup;Straw Medication Administration: Whole meds with liquid Supervision: Patient able to self feed Postural Changes: Seated upright at 90 degrees;Remain upright for at least 30 minutes after po intake    Other  Recommendations Recommended Consults: Consider esophageal assessment Oral Care Recommendations: Oral care BID;Patient independent with oral care Other Recommendations: Clarify dietary restrictions   Follow up Recommendations None      Frequency and Duration            Prognosis Prognosis for Safe Diet Advancement: Good      Swallow Study   General Date of Onset: 03/18/21 HPI: 50 year old with a history of depression/anxiety/tobacco abuse/PTSD presenting with generalized weakness and shortness of breath.  The patient states that she began having shortness of breath on 03/13/2021.  She stated that she had a mechanical fall onto her buttocks on 03/13/2021.  She went to take a nap and woke up with shortness of breath and generalized  weakness.  Her shortness of breath progressed over the next 2 days with increasing cough and shortness of breath.  She denies any hemoptysis, fevers, chills, chest pain, nausea, vomiting, diarrhea, abdominal pain.  The patient states that she quit smoking 6 weeks ago after about  20-pack-year history.  She is exposed to significant amount of secondhand smoke.  The patient states that she has had lower extremity edema which has been chronic for the last 6 to 12 months.  She states that is actually a little better than usual. Pt reported globus sensation midchest with solid foods and BSE requested. Type of Study: Bedside Swallow Evaluation Previous Swallow Assessment: N/A Diet Prior to this Study: Regular;Thin liquids Temperature Spikes Noted: No Respiratory Status: Room air History of Recent Intubation: No Behavior/Cognition: Alert;Cooperative;Pleasant mood Oral Cavity Assessment: Within Functional Limits Oral Care Completed by SLP: No Oral Cavity - Dentition: Adequate natural dentition Vision: Functional for self-feeding Self-Feeding Abilities: Able to feed self Patient Positioning: Upright in bed Baseline Vocal Quality: Normal Volitional Cough: Strong Volitional Swallow: Able to elicit    Oral/Motor/Sensory Function Overall Oral Motor/Sensory Function: Within functional limits   Ice Chips Ice chips: Within functional limits Presentation: Spoon   Thin Liquid Thin Liquid: Within functional limits Presentation: Cup;Self Fed;Straw    Nectar Thick Nectar Thick Liquid: Not tested   Honey Thick Honey Thick Liquid: Not tested   Puree Puree: Within functional limits Presentation: Self Fed   Solid     Solid: Within functional limits Presentation: Self Fed     Thank you,  Holly Fuentes, CCC-SLP [phone]  Holly Fuentes 03/19/2021,2:20 PM

## 2021-03-19 NOTE — Progress Notes (Signed)
PROGRESS NOTE  Holly Fuentes UJW:[id number] DOB: 15-Sep-1970 DOA: 03/15/2021 PCP: Pcp, No  Brief History:  50 year old with a history of depression/anxiety/tobacco abuse/PTSD presenting with generalized weakness and shortness of breath.  The patient states that she began having shortness of breath on 03/13/2021.  She stated that she had a mechanical fall onto her buttocks on 03/13/2021.  She went to take a nap and woke up with shortness of breath and generalized weakness.  Her shortness of breath progressed over the next 2 days with increasing cough and shortness of breath.  She denies any hemoptysis, fevers, chills, chest pain, nausea, vomiting, diarrhea, abdominal pain.  The patient states that she quit smoking 6 weeks ago after about 20-pack-year history.  She is exposed to significant amount of secondhand smoke.  The patient states that she has had lower extremity edema which has been chronic for the last 6 to 12 months.  She states that is actually a little better than usual.   In the emergency department, the patient was afebrile hemodynamically stable with oxygen saturation 88% on room air.  The patient was placed on 2 L with saturation up to 96%.  She will start on bronchodilators and intravenous steroids.   Assessment/Plan: Acute respiratory failure with hypoxia -Secondary to COPD exacerbation and component pulm edema -Stable on 2 L nasal cannula, wean off oxygen as tolerated -Wean oxygen as tolerated for saturation greater 92% -Check BNP--1291 -Personally reviewed chest x-ray--increased interstitial markings -CT chest--ill-defined GGO in upper and lower lobes.  Irregular septal thickening;  left adrenal nodule -Suspect this may be related to interstitial edema -Reviewed CT with pulmonology, Dr. Craige Cotta who agreed with outpatient follow-up and repeat imaging in 4 to 6 weeks -Patient has been weaned off of oxygen and is currently on room air  Cardiomyopathy -Noted to have ejection  fraction of 35 to 40% on echocardiogram with regional wall motion abnormalities -No prior cardiac work-up -She has received intermittent doses of Lasix during her hospitalization -We will consult cardiology to see if any further inpatient cardiac work-up is indicated including ischemic work-up as well as to optimize her medications   COPD exacerbation -Improved -Continue duo nebs -Transition Solu-Medrol to prednisone taper   Lower extremity edema -Venous duplex--neg   Anxiety/depression -Start BuSpar   Generalized weakness -Likely secondary to respiratory failure -Check B12--428 -Folic acid--9.9 -TSH--0.486 -PT eval-no follow up   Hyperglycemia/Diabetes Mellitus -Check hemoglobin A1c--6.2 -lifestyle modification for now   Class II obesity -BMI 38.97 -lifestyle modification           Status is: inpatient   The patient will require care spanning > 2 midnights and should be moved to inpatient because: IV treatments appropriate due to intensity of illness or inability to take PO   Dispo: The patient is from: Home              Anticipated d/c is to: Home              Patient currently is not medically stable to d/c.              Difficult to place patient No               Family Communication:  no Family at bedside   Consultants:  none   Code Status:  FULL    DVT Prophylaxis:  [place] Lovenox     Procedures: As Listed in Progress Note Above  Antibiotics: None          Subjective: She is feeling better today.  Shortness of breath has improved.  Wheezing has resolved.  Currently on room air.   Objective: Vitals:   03/19/21 0428 03/19/21 0700 03/19/21 1318 03/19/21 1940  BP: 140/88  135/79   Pulse: 87  87   Resp: 20  18   Temp: 98.4 F (36.9 C)  98.8 F (37.1 C)   TempSrc:   Oral   SpO2: 91% 93% 93% 92%  Weight:      Height:        Intake/Output Summary (Last 24 hours) at 03/19/2021 2102 Last data filed at 03/19/2021 1300 Gross per 24 hour   Intake 360 ml  Output 400 ml  Net -40 ml    Weight change:  Exam:  General exam: Alert, awake, oriented x 3 Respiratory system: Clear to auscultation. Respiratory effort normal. Cardiovascular system:RRR. No murmurs, rubs, gallops. Gastrointestinal system: Abdomen is nondistended, soft and nontender. No organomegaly or masses felt. Normal bowel sounds heard. Central nervous system: Alert and oriented. No focal neurological deficits. Extremities: No C/C/E, +pedal pulses Skin: No rashes, lesions or ulcers Psychiatry: Judgement and insight appear normal. Mood & affect appropriate.    Data Reviewed: I have personally reviewed following labs and imaging studies Basic Metabolic Panel: Recent Labs  Lab 03/15/21 1815 03/16/21 1000 03/16/21 1431 03/17/21 0631 03/18/21 0603 03/19/21 0609  NA 138  --  135 139 138 139  K 4.1  --  4.2 4.5 4.7 4.2  CL 96*  --  95* 97* 93* 94*  CO2 30  --  31 34* 34* 34*  GLUCOSE 123*  --  167* 157* 142* 141*  BUN 16  --  17 18 21* 22*  CREATININE 0.70  --  0.53 0.61 0.70 0.70  CALCIUM 8.5*  --  8.2* 8.8* 8.9 8.9  MG  --  1.9 1.9 2.1 2.1  --   PHOS  --   --  3.2  --   --   --    Liver Function Tests: Recent Labs  Lab 03/16/21 1431  AST 27  ALT 19  ALKPHOS 94  BILITOT 1.4*  PROT 7.9  ALBUMIN 3.3*   No results for input(s): LIPASE, AMYLASE in the last 168 hours. No results for input(s): AMMONIA in the last 168 hours. Coagulation Profile: Recent Labs  Lab 03/16/21 1431  INR 1.2   CBC: Recent Labs  Lab 03/15/21 1815 03/16/21 1431  WBC 11.3* 9.7  HGB 15.0 15.0  HCT 49.3* 48.6*  MCV 92.8 92.7  PLT 324 285   Cardiac Enzymes: No results for input(s): CKTOTAL, CKMB, CKMBINDEX, TROPONINI in the last 168 hours. BNP: Invalid input(s): POCBNP CBG: No results for input(s): GLUCAP in the last 168 hours. HbA1C: No results for input(s): HGBA1C in the last 72 hours.  Urine analysis:    Component Value Date/Time   COLORURINE YELLOW  08/18/2011 1320   APPEARANCEUR HAZY (A) 08/18/2011 1320   LABSPEC >1.030 (H) 08/18/2011 1320   PHURINE 5.5 08/18/2011 1320   GLUCOSEU 100 (A) 08/18/2011 1320   HGBUR LARGE (A) 08/18/2011 1320   BILIRUBINUR NEGATIVE 08/18/2011 1320   KETONESUR NEGATIVE 08/18/2011 1320   PROTEINUR 100 (A) 08/18/2011 1320   UROBILINOGEN 2.0 (H) 08/18/2011 1320   NITRITE POSITIVE (A) 08/18/2011 1320   LEUKOCYTESUR NEGATIVE 08/18/2011 1320   Sepsis Labs: @LABRCNTIP (procalcitonin:4,lacticidven:4) ) Recent Results (from the past 240 hour(s))  Resp Panel by RT-PCR (Flu A&B,  Covid) Nasopharyngeal Swab     Status: None   Collection Time: 03/15/21  9:45 PM   Specimen: Nasopharyngeal Swab; Nasopharyngeal(NP) swabs in vial transport medium  Result Value Ref Range Status   SARS Coronavirus 2 by RT PCR NEGATIVE NEGATIVE Final    Comment: (NOTE) SARS-CoV-2 target nucleic acids are NOT DETECTED.  The SARS-CoV-2 RNA is generally detectable in upper respiratory specimens during the acute phase of infection. The lowest concentration of SARS-CoV-2 viral copies this assay can detect is 138 copies/mL. A negative result does not preclude SARS-Cov-2 infection and should not be used as the sole basis for treatment or other patient management decisions. A negative result may occur with  improper specimen collection/handling, submission of specimen other than nasopharyngeal swab, presence of viral mutation(s) within the areas targeted by this assay, and inadequate number of viral copies(<138 copies/mL). A negative result must be combined with clinical observations, patient history, and epidemiological information. The expected result is Negative.  Fact Sheet for Patients:  BloggerCourse.com  Fact Sheet for Healthcare Providers:  SeriousBroker.it  This test is no t yet approved or cleared by the [place] FDA and  has been authorized for detection and/or diagnosis  of SARS-CoV-2 by FDA under an Emergency Use Authorization (EUA). This EUA will remain  in effect (meaning this test can be used) for the duration of the COVID-19 declaration under Section 564(b)(1) of the Act, 21 U.S.C.section 360bbb-3(b)(1), unless the authorization is terminated  or revoked sooner.       Influenza A by PCR NEGATIVE NEGATIVE Final   Influenza B by PCR NEGATIVE NEGATIVE Final    Comment: (NOTE) The Xpert Xpress SARS-CoV-2/FLU/RSV plus assay is intended as an aid in the diagnosis of influenza from Nasopharyngeal swab specimens and should not be used as a sole basis for treatment. Nasal washings and aspirates are unacceptable for Xpert Xpress SARS-CoV-2/FLU/RSV testing.  Fact Sheet for Patients: BloggerCourse.com  Fact Sheet for Healthcare Providers: SeriousBroker.it  This test is not yet approved or cleared by the [place] FDA and has been authorized for detection and/or diagnosis of SARS-CoV-2 by FDA under an Emergency Use Authorization (EUA). This EUA will remain in effect (meaning this test can be used) for the duration of the COVID-19 declaration under Section 564(b)(1) of the Act, 21 U.S.C. section 360bbb-3(b)(1), unless the authorization is terminated or revoked.  Performed at [facility], [address] [place], [place] [address]      Scheduled Meds:  budesonide (PULMICORT) nebulizer solution  0.5 mg Nebulization BID   busPIRone  5 mg Oral BID   enoxaparin (LOVENOX) injection  40 mg Subcutaneous Q24H   ipratropium-albuterol  3 mL Nebulization BID   Living Better with Heart Failure Book   Does not apply Once   pantoprazole  40 mg Oral Daily   polyethylene glycol  17 g Oral Daily   [START ON 03/20/2021] predniSONE  40 mg Oral Q breakfast   senna  2 tablet Oral Daily   Continuous Infusions:  Procedures/Studies: DG Chest 2 View  Result Date: 03/15/2021 CLINICAL DATA:  Weakness EXAM: CHEST - 2  VIEW COMPARISON:  05/15/2004 FINDINGS: The lungs are symmetrically well expanded. There is mild central bronchial wall thickening in keeping with probable airway inflammation. No pneumothorax or pleural effusion. No confluent pulmonary infiltrate. Cardiac size within normal limits. No acute bone abnormality. IMPRESSION: Mild bronchitic changes noted.  No confluent pulmonary infiltrate. Electronically Signed   By: Helyn Numbers MD   On: 03/15/2021 19:21  CT CHEST WO CONTRAST  Result Date: 03/16/2021 CLINICAL DATA:  Dyspnea, chronic dyspnea of unclear etiology in a patient with history of COPD. EXAM: CT CHEST WITHOUT CONTRAST TECHNIQUE: Multidetector CT imaging of the chest was performed following the standard protocol without IV contrast. COMPARISON:  Chest x-ray from March 15, 2021. FINDINGS: Cardiovascular: Calcified atheromatous plaque, mild in the thoracic aorta. No aneurysmal dilation. Main pulmonary artery 3.8 cm. Heart size normal without substantial pericardial effusion. Mediastinum/Nodes: Esophagus grossly normal. No gross hilar adenopathy. No mediastinal adenopathy. Scattered lymph nodes throughout the chest without pathologic enlargement. Lungs/Pleura: Diffuse mild septal thickening, basilar atelectasis and areas of ground-glass. Multinodular appearance of lung parenchyma. Numerous ill-defined ground-glass nodules throughout upper and lower lobes. Septal thickening is mildly irregular. No effusion or consolidation. LEFT lower lobe nodule 6 mm (image 105/4) (Image 67/4) 10 mm ground-glass nodule in the RIGHT upper lobe. Innumerable other scattered pulmonary nodules throughout the chest. Upper Abdomen: Limited assessment of upper abdominal contents showing no acute process. Nodule adjacent to the LEFT adrenal and stomach is ovoid measuring 13 mm. Isodense to the spleen. This is immediately adjacent to the LEFT adrenal. No upper abdominal lymphadenopathy. Musculoskeletal: No acute musculoskeletal process.  Spinal degenerative changes. No destructive bone finding. IMPRESSION: 1. Numerous ill-defined ground-glass nodules throughout upper and lower lobes. Septal thickening is mildly irregular. Findings may represent sequela of atypical infection. This is also seen on a background of mildly irregular septal thickening. Differential considerations would include metastatic disease to the chest and respiratory bronchiolitis. Pulmonary consultation may be helpful, close follow-up is suggested. 2. Given the presence of septal thickening would also correlate with any clinical or laboratory evidence of heart failure in the background. 3. Scattered mildly prominent lymph nodes in the chest may be reactive, attention on follow-up. 4. Nodule adjacent the LEFT adrenal favored to represent a small splenule. Consider follow-up dedicated abdominal imaging. 5. Main pulmonary artery 3.8 cm, can be seen in the setting of pulmonary arterial hypertension. 6. Aortic atherosclerosis. Aortic Atherosclerosis (ICD10-I70.0). Electronically Signed   By: Donzetta Kohut M.D.   On: 03/16/2021 11:44   US Venous Img Lower Bilateral (DVT)  Result Date: 03/16/2021 CLINICAL DATA:  50 year old female with and intermittent bilateral lower extremity edema EXAM: BILATERAL LOWER EXTREMITY VENOUS DOPPLER ULTRASOUND TECHNIQUE: Gray-scale sonography with compression, as well as color and duplex ultrasound, were performed to evaluate the deep venous system(s) from the level of the common femoral vein through the popliteal and proximal calf veins. COMPARISON:  None. FINDINGS: VENOUS Normal compressibility of the common femoral, superficial femoral, and popliteal veins, as well as the visualized calf veins. Visualized portions of profunda femoral vein and great saphenous vein unremarkable. No filling defects to suggest DVT on grayscale or color Doppler imaging. Doppler waveforms show normal direction of venous flow, normal respiratory plasticity and response to  augmentation. OTHER Diffuse bilateral soft tissue edema. Limitations: none IMPRESSION: No femoropopliteal DVT nor evidence of DVT within the visualized calf veins of either lower extremity. If clinical symptoms are inconsistent or if there are persistent or worsening symptoms, further imaging (possibly involving the iliac veins) may be warranted. Roanna Banning, MD [facility] Electronically Signed   By: Roanna Banning MD   On: 03/16/2021 09:55   ECHOCARDIOGRAM COMPLETE  Result Date: 03/19/2021    ECHOCARDIOGRAM REPORT   Patient Name:   JYRA LAGARES Date of Exam: 03/19/2021 Medical Rec #:  [id number]      Height:  63.0 in Accession #:    [id number]     Weight:       220.0 lb Date of Birth:  [birth year]      BSA:          2.014 m Patient Age:    50 years       BP:           140/88 mmHg Patient Gender: F              HR:           87 bpm. [facility] Procedure: 2D Echo, Cardiac Doppler and Color Doppler Indications:    CHF-Acute Diastolic  History:        Patient has no prior history of Echocardiogram examinations.                 COPD; Risk Factors:Diabetes.  Sonographer:    Mikki Harbororothy Buchanan Referring Phys: [phone] DAVID TAT IMPRESSIONS  1. Left ventricular ejection fraction, by estimation, is 35 to 40%. The left ventricle has moderately decreased function. The left ventricle demonstrates regional wall motion abnormalities (see scoring diagram/findings for description). Left ventricular  diastolic parameters were normal.  2. Right ventricular systolic function is mildly reduced. The right ventricular size is normal. Tricuspid regurgitation signal is inadequate for assessing PA pressure.  3. The mitral valve is grossly normal. Trivial mitral valve regurgitation.  4. The aortic valve was not well visualized. There is mild calcification of the aortic valve. Aortic valve regurgitation is not visualized. Aortic valve mean gradient measures 4.0 mmHg.   5. The inferior vena cava is normal in size with greater than 50% respiratory variability, suggesting right atrial pressure of 3 mmHg. Comparison(s): No prior Echocardiogram. FINDINGS  Left Ventricle: Left ventricular ejection fraction, by estimation, is 35 to 40%. The left ventricle has moderately decreased function. The left ventricle demonstrates regional wall motion abnormalities. The left ventricular internal cavity size was normal in size. There is no left ventricular hypertrophy. Left ventricular diastolic parameters were normal.  LV Wall Scoring: The anterior wall, antero-lateral wall, and inferior septum are hypokinetic. The entire inferior wall, apical lateral segment, apical anterior segment, and apex are normal. Right Ventricle: The right ventricular size is normal. No increase in right ventricular wall thickness. Right ventricular systolic function is mildly reduced. Tricuspid regurgitation signal is inadequate for assessing PA pressure. Left Atrium: Left atrial size was normal in size. Right Atrium: Right atrial size was normal in size. Pericardium: There is no evidence of pericardial effusion. Mitral Valve: The mitral valve is grossly normal. Trivial mitral valve regurgitation. MV peak gradient, 4.6 mmHg. The mean mitral valve gradient is 2.0 mmHg. Tricuspid Valve: The tricuspid valve is grossly normal. Tricuspid valve regurgitation is trivial. Aortic Valve: The aortic valve was not well visualized. There is mild calcification of the aortic valve. There is mild aortic valve annular calcification. Aortic valve regurgitation is not visualized. Aortic valve mean gradient measures 4.0 mmHg. Aortic valve peak gradient measures 7.5 mmHg. Aortic valve area, by VTI measures 2.12 cm. Pulmonic Valve: The pulmonic valve was not well visualized. Pulmonic valve regurgitation is trivial. Aorta: The aortic root is normal in size and structure. Venous: The inferior vena cava is normal in size with greater than 50%  respiratory variability, suggesting right atrial pressure of 3 mmHg. IAS/Shunts: No atrial level shunt detected by color flow Doppler.  LEFT VENTRICLE PLAX 2D LVIDd:         5.03 cm  Diastology LVIDs:         3.50 cm      LV e' medial:    7.35 cm/s LV PW:         0.97 cm      LV E/e' medial:  12.7 LV IVS:        1.03 cm      LV e' lateral:   9.75 cm/s LVOT diam:     2.10 cm      LV E/e' lateral: 9.6 LV SV:         61 LV SV Index:   30 LVOT Area:     3.46 cm  LV Volumes (MOD) LV vol d, MOD A2C: 93.8 ml LV vol d, MOD A4C: 102.0 ml LV vol s, MOD A2C: 61.6 ml LV vol s, MOD A4C: 60.5 ml LV SV MOD A2C:     32.2 ml LV SV MOD A4C:     102.0 ml LV SV MOD BP:      36.0 ml RIGHT VENTRICLE RV Basal diam:  3.14 cm RV Mid diam:    3.22 cm RV S prime:     10.80 cm/s TAPSE (M-mode): 2.6 cm LEFT ATRIUM             Index       RIGHT ATRIUM           Index LA diam:        3.50 cm 1.74 cm/m  RA Area:     14.30 cm LA Vol (A2C):   63.9 ml 31.73 ml/m RA Volume:   37.00 ml  18.38 ml/m LA Vol (A4C):   25.1 ml 12.47 ml/m LA Biplane Vol: 39.8 ml 19.77 ml/m  AORTIC VALVE AV Area (Vmax):    2.25 cm AV Area (Vmean):   2.14 cm AV Area (VTI):     2.12 cm AV Vmax:           137.00 cm/s AV Vmean:          93.500 cm/s AV VTI:            0.289 m AV Peak Grad:      7.5 mmHg AV Mean Grad:      4.0 mmHg LVOT Vmax:         88.80 cm/s LVOT Vmean:        57.900 cm/s LVOT VTI:          0.177 m LVOT/AV VTI ratio: 0.61  AORTA Ao Root diam: 3.60 cm MITRAL VALVE MV Area (PHT): 4.39 cm    SHUNTS MV Area VTI:   2.26 cm    Systemic VTI:  0.18 m MV Peak grad:  4.6 mmHg    Systemic Diam: 2.10 cm MV Mean grad:  2.0 mmHg MV Vmax:       1.07 m/s MV Vmean:      66.1 cm/s MV Decel Time: 173 msec MV E velocity: 93.40 cm/s MV A velocity: 92.10 cm/s MV E/A ratio:  1.01 Nona Dell MD Electronically signed by Nona Dell MD Signature Date/Time: 03/19/2021/4:57:58 PM    Final     Erick Blinks, MD  [facility]  If 7PM-7AM, please contact  night-coverage [email or web address]  03/19/2021, 9:02 PM   LOS: 3 days

## 2021-03-19 NOTE — Progress Notes (Signed)
  Echocardiogram 2D Echocardiogram has been performed.  Carolyne Fiscal 03/19/2021, 12:10 PM

## 2021-03-20 DIAGNOSIS — R739 Hyperglycemia, unspecified: Secondary | ICD-10-CM

## 2021-03-20 DIAGNOSIS — J40 Bronchitis, not specified as acute or chronic: Secondary | ICD-10-CM

## 2021-03-20 DIAGNOSIS — J9601 Acute respiratory failure with hypoxia: Secondary | ICD-10-CM

## 2021-03-20 MED ORDER — METOPROLOL SUCCINATE ER 25 MG PO TB24
12.5000 mg | ORAL_TABLET | Freq: Every day | ORAL | Status: DC
  Administered 2021-03-20: 12.5 mg via ORAL
  Filled 2021-03-20: qty 1

## 2021-03-20 MED ORDER — METOPROLOL SUCCINATE ER 25 MG PO TB24: 12.5000 mg | ORAL_TABLET | Freq: Every day | ORAL | 1 refills | Status: DC

## 2021-03-20 MED ORDER — LOSARTAN POTASSIUM 25 MG PO TABS
12.5000 mg | ORAL_TABLET | Freq: Every day | ORAL | Status: DC
  Administered 2021-03-20: 12.5 mg via ORAL
  Filled 2021-03-20: qty 1

## 2021-03-20 MED ORDER — LOSARTAN POTASSIUM 25 MG PO TABS: 12.5000 mg | ORAL_TABLET | Freq: Every day | ORAL | 1 refills | Status: DC

## 2021-03-20 NOTE — Progress Notes (Signed)
Nsg Discharge Note  Admit Date:  03/15/2021 Discharge date: 03/20/2021   Claryssa Sandner [place] to be D/C'd Home per MD order.  AVS completed.  Copy for chart, and copy for patient signed, and dated. Patient/caregiver able to verbalize understanding.  Discharge Medication: Allergies as of 03/20/2021       Reactions   Cranberry Hives   Hydrocodone-acetaminophen Other (See Comments)   Reaction : migraines        Medication List     STOP taking these medications    amoxicillin 500 MG capsule Commonly known as: AMOXIL   citalopram 20 MG tablet Commonly known as: CELEXA   clonazePAM 0.5 MG tablet Commonly known as: KLONOPIN   cyclobenzaprine 10 MG tablet Commonly known as: FLEXERIL   ibuprofen 600 MG tablet Commonly known as: ADVIL   ibuprofen 800 MG tablet Commonly known as: ADVIL   methocarbamol 500 MG tablet Commonly known as: ROBAXIN   oxyCODONE-acetaminophen 5-325 MG tablet Commonly known as: PERCOCET/ROXICET   traZODone 100 MG tablet Commonly known as: DESYREL       TAKE these medications    albuterol 108 (90 Base) MCG/ACT inhaler Commonly known as: VENTOLIN HFA Inhale 2 puffs into the lungs every 6 (six) hours as needed for wheezing or shortness of breath.   budesonide-formoterol 80-4.5 MCG/ACT inhaler Commonly known as: Symbicort Inhale 2 puffs into the lungs in the morning and at bedtime.   busPIRone 7.5 MG tablet Commonly known as: BUSPAR Take 1 tablet (7.5 mg total) by mouth 2 (two) times daily.   furosemide 20 MG tablet Commonly known as: Lasix Take 1 tablet (20 mg total) by mouth daily.   losartan 25 MG tablet Commonly known as: COZAAR Take 0.5 tablets (12.5 mg total) by mouth daily. Start taking on: March 21, 2021   metoprolol succinate 25 MG 24 hr tablet Commonly known as: TOPROL-XL Take 0.5 tablets (12.5 mg total) by mouth daily. Start taking on: March 21, 2021   pantoprazole 40 MG tablet Commonly known as: PROTONIX Take 1 tablet (40  mg total) by mouth daily.   predniSONE 10 MG tablet Commonly known as: DELTASONE Take 40mg  po daily for 2 days then 30mg  daily for 2 days then 20mg  daily for 2 days then 10mg  daily for 2 days then stop        Discharge Assessment: Vitals:   03/20/21 0600 03/20/21 0827  BP: 138/82   Pulse: 80   Resp: 20   Temp: 97.7 F (36.5 C)   SpO2: 93% 94%   Skin clean, dry and intact without evidence of skin break down, no evidence of skin tears noted. IV catheter discontinued intact. Site without signs and symptoms of complications - no redness or edema noted at insertion site, patient denies c/o pain - only slight tenderness at site.  Dressing with slight pressure applied.  D/c Instructions-Education: Discharge instructions given to patient/family with verbalized understanding. D/c education completed with patient/family including follow up instructions, medication list, d/c activities limitations if indicated, with other d/c instructions as indicated by MD - patient able to verbalize understanding, all questions fully answered. Patient instructed to return to ED, call 911, or call MD for any changes in condition.  Patient escorted via WC, and D/C home via private auto.  , RN 03/20/2021 12:18 PM

## 2021-03-20 NOTE — Discharge Summary (Signed)
Physician Discharge Summary  KEANI GOTCHER WJX:[id number] DOB: [birth year] DOA: 03/15/2021  PCP: Pcp, No  Admit date: 03/15/2021 Discharge date: 03/20/2021  Admitted From: home Disposition:  home  Recommendations for Outpatient Follow-up:  She has been referred to [facility] to establish primary care Follow-up with cardiology has been scheduled for further work-up of cardiomyopathy She has been referred to pulmonology for follow-up on CT chest  Home Health: Equipment/Devices:  Discharge Condition: Stable CODE STATUS: Full code Diet recommendation: Heart healthy  Brief/Interim Summary: 50 year old with a history of depression/anxiety/tobacco abuse/PTSD presenting with generalized weakness and shortness of breath.  The patient states that she began having shortness of breath on 03/13/2021.  She stated that she had a mechanical fall onto her buttocks on 03/13/2021.  She went to take a nap and woke up with shortness of breath and generalized weakness.  Her shortness of breath progressed over the next 2 days with increasing cough and shortness of breath.  She denies any hemoptysis, fevers, chills, chest pain, nausea, vomiting, diarrhea, abdominal pain.  The patient states that she quit smoking 6 weeks ago after about 20-pack-year history.  She is exposed to significant amount of secondhand smoke.  The patient states that she has had lower extremity edema which has been chronic for the last 6 to 12 months.  She states that is actually a little better than usual.   In the emergency department, the patient was afebrile hemodynamically stable with oxygen saturation 88% on room air.  The patient was placed on 2 L with saturation up to 96%.  She will start on bronchodilators and intravenous steroids.  Discharge Diagnoses:  Principal Problem:   Acute respiratory failure with hypoxia (HCC) Active Problems:   Acute bronchitis   Generalized weakness   Fall at home, initial encounter   Leukocytosis    Hyperglycemia   Anxiety   Depression   Dehydration   COPD with acute exacerbation (HCC)  Acute respiratory failure with hypoxia -Secondary to COPD exacerbation and component pulm edema -Stable on 2 L nasal cannula, wean off oxygen as tolerated -Wean oxygen as tolerated for saturation greater 92% -Check BNP--1291 -Personally reviewed chest x-ray--increased interstitial markings -CT chest--ill-defined GGO in upper and lower lobes.  Irregular septal thickening;  left adrenal nodule -Suspect this may be related to interstitial edema -Reviewed CT with pulmonology, Dr. Craige Cotta who agreed with outpatient follow-up and repeat imaging in 4 to 6 weeks -Patient has been weaned off of oxygen and is currently on room air   Cardiomyopathy/acute systolic congestive heart failure -Noted to have ejection fraction of 35 to 40% on echocardiogram with regional wall motion abnormalities -No prior cardiac work-up -She has received intermittent doses of Lasix during her hospitalization -Seen by cardiology, started on Toprol and losartan.  She also be discharged on low-dose Lasix -Further work-up for cardiomyopathy to be done as an outpatient   COPD exacerbation -Improved -Continue duo nebs -Transition Solu-Medrol to prednisone taper   Lower extremity edema -Venous duplex--neg   Anxiety/depression -Start BuSpar   Generalized weakness -Likely secondary to respiratory failure -Check B12--428 -Folic acid--9.9 -TSH--0.486 -PT eval-no follow up -Overall weakness has improved and she is ambulating unassisted without difficulty   Hyperglycemia/Diabetes Mellitus -Check hemoglobin A1c--6.2 -lifestyle modification for now   Class II obesity -BMI 38.97 -lifestyle modification  Discharge Instructions  Discharge Instructions     Ambulatory referral to Pulmonology   Complete by: As directed    Abnormal chest CT   Reason for referral: Asthma/COPD  Diet - low sodium heart healthy   Complete by: As  directed    Diet - low sodium heart healthy   Complete by: As directed    Increase activity slowly   Complete by: As directed    Increase activity slowly   Complete by: As directed       Allergies as of 03/20/2021       Reactions   Cranberry Hives   Hydrocodone-acetaminophen Other (See Comments)   Reaction : migraines        Medication List     STOP taking these medications    amoxicillin 500 MG capsule Commonly known as: AMOXIL   citalopram 20 MG tablet Commonly known as: CELEXA   clonazePAM 0.5 MG tablet Commonly known as: KLONOPIN   cyclobenzaprine 10 MG tablet Commonly known as: FLEXERIL   ibuprofen 600 MG tablet Commonly known as: ADVIL   ibuprofen 800 MG tablet Commonly known as: ADVIL   methocarbamol 500 MG tablet Commonly known as: ROBAXIN   oxyCODONE-acetaminophen 5-325 MG tablet Commonly known as: PERCOCET/ROXICET   traZODone 100 MG tablet Commonly known as: DESYREL       TAKE these medications    albuterol 108 (90 Base) MCG/ACT inhaler Commonly known as: VENTOLIN HFA Inhale 2 puffs into the lungs every 6 (six) hours as needed for wheezing or shortness of breath.   budesonide-formoterol 80-4.5 MCG/ACT inhaler Commonly known as: Symbicort Inhale 2 puffs into the lungs in the morning and at bedtime.   busPIRone 7.5 MG tablet Commonly known as: BUSPAR Take 1 tablet (7.5 mg total) by mouth 2 (two) times daily.   furosemide 20 MG tablet Commonly known as: Lasix Take 1 tablet (20 mg total) by mouth daily.   losartan 25 MG tablet Commonly known as: COZAAR Take 0.5 tablets (12.5 mg total) by mouth daily. Start taking on: March 21, 2021   metoprolol succinate 25 MG 24 hr tablet Commonly known as: TOPROL-XL Take 0.5 tablets (12.5 mg total) by mouth daily. Start taking on: March 21, 2021   pantoprazole 40 MG tablet Commonly known as: PROTONIX Take 1 tablet (40 mg total) by mouth daily.   predniSONE 10 MG tablet Commonly known as:  DELTASONE Take 40mg  po daily for 2 days then 30mg  daily for 2 days then 20mg  daily for 2 days then 10mg  daily for 2 days then stop        Follow-up Information     , PA-C Follow up on 04/10/2021.   Specialties: Physician Assistant, Cardiology Why: [facility] Follow-up on 04/10/2021 at 3:30 PM. Contact information: [address] [place] 04/12/2021 04/12/2021 [phone]         Pulmonology office will call you for follow up appointment to discuss rechecking chest CT Follow up.                 Allergies  Allergen Reactions   Cranberry Hives   Hydrocodone-Acetaminophen Other (See Comments)    Reaction : migraines    Consultations: Cardiology   Procedures/Studies: DG Chest 2 View  Result Date: 03/15/2021 CLINICAL DATA:  Weakness EXAM: CHEST - 2 VIEW COMPARISON:  05/15/2004 FINDINGS: The lungs are symmetrically well expanded. There is mild central bronchial wall thickening in keeping with probable airway inflammation. No pneumothorax or pleural effusion. No confluent pulmonary infiltrate. Cardiac size within normal limits. No acute bone abnormality. IMPRESSION: Mild bronchitic changes noted.  No confluent pulmonary infiltrate. Electronically Signed   By: [id number] MD   On:  03/15/2021 19:21   CT CHEST WO CONTRAST  Result Date: 03/16/2021 CLINICAL DATA:  Dyspnea, chronic dyspnea of unclear etiology in a patient with history of COPD. EXAM: CT CHEST WITHOUT CONTRAST TECHNIQUE: Multidetector CT imaging of the chest was performed following the standard protocol without IV contrast. COMPARISON:  Chest x-ray from March 15, 2021. FINDINGS: Cardiovascular: Calcified atheromatous plaque, mild in the thoracic aorta. No aneurysmal dilation. Main pulmonary artery 3.8 cm. Heart size normal without substantial pericardial effusion. Mediastinum/Nodes: Esophagus grossly normal. No gross hilar adenopathy. No mediastinal adenopathy. Scattered lymph nodes throughout the  chest without pathologic enlargement. Lungs/Pleura: Diffuse mild septal thickening, basilar atelectasis and areas of ground-glass. Multinodular appearance of lung parenchyma. Numerous ill-defined ground-glass nodules throughout upper and lower lobes. Septal thickening is mildly irregular. No effusion or consolidation. LEFT lower lobe nodule 6 mm (image 105/4) (Image 67/4) 10 mm ground-glass nodule in the RIGHT upper lobe. Innumerable other scattered pulmonary nodules throughout the chest. Upper Abdomen: Limited assessment of upper abdominal contents showing no acute process. Nodule adjacent to the LEFT adrenal and stomach is ovoid measuring 13 mm. Isodense to the spleen. This is immediately adjacent to the LEFT adrenal. No upper abdominal lymphadenopathy. Musculoskeletal: No acute musculoskeletal process. Spinal degenerative changes. No destructive bone finding. IMPRESSION: 1. Numerous ill-defined ground-glass nodules throughout upper and lower lobes. Septal thickening is mildly irregular. Findings may represent sequela of atypical infection. This is also seen on a background of mildly irregular septal thickening. Differential considerations would include metastatic disease to the chest and respiratory bronchiolitis. Pulmonary consultation may be helpful, close follow-up is suggested. 2. Given the presence of septal thickening would also correlate with any clinical or laboratory evidence of heart failure in the background. 3. Scattered mildly prominent lymph nodes in the chest may be reactive, attention on follow-up. 4. Nodule adjacent the LEFT adrenal favored to represent a small splenule. Consider follow-up dedicated abdominal imaging. 5. Main pulmonary artery 3.8 cm, can be seen in the setting of pulmonary arterial hypertension. 6. Aortic atherosclerosis. Aortic Atherosclerosis (ICD10-I70.0). Electronically Signed   By: Donzetta Kohut M.D.   On: 03/16/2021 11:44   US Venous Img Lower Bilateral (DVT)  Result  Date: 03/16/2021 CLINICAL DATA:  50 year old female with and intermittent bilateral lower extremity edema EXAM: BILATERAL LOWER EXTREMITY VENOUS DOPPLER ULTRASOUND TECHNIQUE: Gray-scale sonography with compression, as well as color and duplex ultrasound, were performed to evaluate the deep venous system(s) from the level of the common femoral vein through the popliteal and proximal calf veins. COMPARISON:  None. FINDINGS: VENOUS Normal compressibility of the common femoral, superficial femoral, and popliteal veins, as well as the visualized calf veins. Visualized portions of profunda femoral vein and great saphenous vein unremarkable. No filling defects to suggest DVT on grayscale or color Doppler imaging. Doppler waveforms show normal direction of venous flow, normal respiratory plasticity and response to augmentation. OTHER Diffuse bilateral soft tissue edema. Limitations: none IMPRESSION: No femoropopliteal DVT nor evidence of DVT within the visualized calf veins of either lower extremity. If clinical symptoms are inconsistent or if there are persistent or worsening symptoms, further imaging (possibly involving the iliac veins) may be warranted. Roanna [place], MD [facility] Radiology Electronically Signed   By: Roanna Banning MD   On: 03/16/2021 09:55   ECHOCARDIOGRAM COMPLETE  Result Date: 03/19/2021    ECHOCARDIOGRAM REPORT   Patient Name:   TWYLAH BENNETTS Date of Exam: 03/19/2021 Medical Rec #:  [id number]      Height:  63.0 in Accession #:    [id number]:       220.0 lb Date of Birth:  22-Dec-1970      BSA:          2.014 m Patient Age:    50 years       BP:           140/88 mmHg Patient Gender: F              HR:           87 bpm. [facility] Procedure: 2D Echo, Cardiac Doppler and Color Doppler Indications:    CHF-Acute Diastolic  History:        Patient has no prior history of Echocardiogram examinations.                 COPD; Risk  Factors:Diabetes.  Sonographer:    Mikki Harbor Referring Phys: [phone] DAVID TAT IMPRESSIONS  1. Left ventricular ejection fraction, by estimation, is 35 to 40%. The left ventricle has moderately decreased function. The left ventricle demonstrates regional wall motion abnormalities (see scoring diagram/findings for description). Left ventricular  diastolic parameters were normal.  2. Right ventricular systolic function is mildly reduced. The right ventricular size is normal. Tricuspid regurgitation signal is inadequate for assessing PA pressure.  3. The mitral valve is grossly normal. Trivial mitral valve regurgitation.  4. The aortic valve was not well visualized. There is mild calcification of the aortic valve. Aortic valve regurgitation is not visualized. Aortic valve mean gradient measures 4.0 mmHg.  5. The inferior vena cava is normal in size with greater than 50% respiratory variability, suggesting right atrial pressure of 3 mmHg. Comparison(s): No prior Echocardiogram. FINDINGS  Left Ventricle: Left ventricular ejection fraction, by estimation, is 35 to 40%. The left ventricle has moderately decreased function. The left ventricle demonstrates regional wall motion abnormalities. The left ventricular internal cavity size was normal in size. There is no left ventricular hypertrophy. Left ventricular diastolic parameters were normal.  LV Wall Scoring: The anterior wall, antero-lateral wall, and inferior septum are hypokinetic. The entire inferior wall, apical lateral segment, apical anterior segment, and apex are normal. Right Ventricle: The right ventricular size is normal. No increase in right ventricular wall thickness. Right ventricular systolic function is mildly reduced. Tricuspid regurgitation signal is inadequate for assessing PA pressure. Left Atrium: Left atrial size was normal in size. Right Atrium: Right atrial size was normal in size. Pericardium: There is no evidence of pericardial effusion.  Mitral Valve: The mitral valve is grossly normal. Trivial mitral valve regurgitation. MV peak gradient, 4.6 mmHg. The mean mitral valve gradient is 2.0 mmHg. Tricuspid Valve: The tricuspid valve is grossly normal. Tricuspid valve regurgitation is trivial. Aortic Valve: The aortic valve was not well visualized. There is mild calcification of the aortic valve. There is mild aortic valve annular calcification. Aortic valve regurgitation is not visualized. Aortic valve mean gradient measures 4.0 mmHg. Aortic valve peak gradient measures 7.5 mmHg. Aortic valve area, by VTI measures 2.12 cm. Pulmonic Valve: The pulmonic valve was not well visualized. Pulmonic valve regurgitation is trivial. Aorta: The aortic root is normal in size and structure. Venous: The inferior vena cava is normal in size with greater than 50% respiratory variability, suggesting right atrial pressure of 3 mmHg. IAS/Shunts: No atrial level shunt detected by color flow Doppler.  LEFT VENTRICLE PLAX 2D LVIDd:         5.03 cm  Diastology LVIDs:         3.50 cm      LV e' medial:    7.35 cm/s LV PW:         0.97 cm      LV E/e' medial:  12.7 LV IVS:        1.03 cm      LV e' lateral:   9.75 cm/s LVOT diam:     2.10 cm      LV E/e' lateral: 9.6 LV SV:         61 LV SV Index:   30 LVOT Area:     3.46 cm  LV Volumes (MOD) LV vol d, MOD A2C: 93.8 ml LV vol d, MOD A4C: 102.0 ml LV vol s, MOD A2C: 61.6 ml LV vol s, MOD A4C: 60.5 ml LV SV MOD A2C:     32.2 ml LV SV MOD A4C:     102.0 ml LV SV MOD BP:      36.0 ml RIGHT VENTRICLE RV Basal diam:  3.14 cm RV Mid diam:    3.22 cm RV S prime:     10.80 cm/s TAPSE (M-mode): 2.6 cm LEFT ATRIUM             Index       RIGHT ATRIUM           Index LA diam:        3.50 cm 1.74 cm/m  RA Area:     14.30 cm LA Vol (A2C):   63.9 ml 31.73 ml/m RA Volume:   37.00 ml  18.38 ml/m LA Vol (A4C):   25.1 ml 12.47 ml/m LA Biplane Vol: 39.8 ml 19.77 ml/m  AORTIC VALVE AV Area (Vmax):    2.25 cm AV Area (Vmean):   2.14 cm  AV Area (VTI):     2.12 cm AV Vmax:           137.00 cm/s AV Vmean:          93.500 cm/s AV VTI:            0.289 m AV Peak Grad:      7.5 mmHg AV Mean Grad:      4.0 mmHg LVOT Vmax:         88.80 cm/s LVOT Vmean:        57.900 cm/s LVOT VTI:          0.177 m LVOT/AV VTI ratio: 0.61  AORTA Ao Root diam: 3.60 cm MITRAL VALVE MV Area (PHT): 4.39 cm    SHUNTS MV Area VTI:   2.26 cm    Systemic VTI:  0.18 m MV Peak grad:  4.6 mmHg    Systemic Diam: 2.10 cm MV Mean grad:  2.0 mmHg MV Vmax:       1.07 m/s MV Vmean:      66.1 cm/s MV Decel Time: 173 msec MV E velocity: 93.40 cm/s MV A velocity: 92.10 cm/s MV E/A ratio:  1.01 Nona Dell MD Electronically signed by Nona Dell MD Signature Date/Time: 03/19/2021/4:57:58 PM    Final       Subjective: Feels better.  Denies any shortness of breath.  Wants to go home  Discharge Exam: Vitals:   03/19/21 1940 03/19/21 2147 03/20/21 0600 03/20/21 0827  BP:  132/66 138/82   Pulse:  83 80   Resp:  20 20   Temp:  98.3 F (36.8 C) 97.7 F (36.5 C)   TempSrc:  Oral   SpO2: 92% 93% 93% 94%  Weight:      Height:        General: Pt is alert, awake, not in acute distress Cardiovascular: RRR, S1/S2 +, no rubs, no gallops Respiratory: CTA bilaterally, no wheezing, no rhonchi Abdominal: Soft, NT, ND, bowel sounds + Extremities: no edema, no cyanosis    The results of significant diagnostics from this hospitalization (including imaging, microbiology, ancillary and laboratory) are listed below for reference.     Microbiology: Recent Results (from the past 240 hour(s))  Resp Panel by RT-PCR (Flu A&B, Covid) Nasopharyngeal Swab     Status: None   Collection Time: 03/15/21  9:45 PM   Specimen: Nasopharyngeal Swab; Nasopharyngeal(NP) swabs in vial transport medium  Result Value Ref Range Status   SARS Coronavirus 2 by RT PCR NEGATIVE NEGATIVE Final    Comment: (NOTE) SARS-CoV-2 target nucleic acids are NOT DETECTED.  The SARS-CoV-2 RNA is  generally detectable in upper respiratory specimens during the acute phase of infection. The lowest concentration of SARS-CoV-2 viral copies this assay can detect is 138 copies/mL. A negative result does not preclude SARS-Cov-2 infection and should not be used as the sole basis for treatment or other patient management decisions. A negative result may occur with  improper specimen collection/handling, submission of specimen other than nasopharyngeal swab, presence of viral mutation(s) within the areas targeted by this assay, and inadequate number of viral copies(<138 copies/mL). A negative result must be combined with clinical observations, patient history, and epidemiological information. The expected result is Negative.  Fact Sheet for Patients:  BloggerCourse.com  Fact Sheet for Healthcare Providers:  SeriousBroker.it  This test is no t yet approved or cleared by the [place] FDA and  has been authorized for detection and/or diagnosis of SARS-CoV-2 by FDA under an Emergency Use Authorization (EUA). This EUA will remain  in effect (meaning this test can be used) for the duration of the COVID-19 declaration under Section 564(b)(1) of the Act, 21 U.S.C.section 360bbb-3(b)(1), unless the authorization is terminated  or revoked sooner.       Influenza A by PCR NEGATIVE NEGATIVE Final   Influenza B by PCR NEGATIVE NEGATIVE Final    Comment: (NOTE) The Xpert Xpress SARS-CoV-2/FLU/RSV plus assay is intended as an aid in the diagnosis of influenza from Nasopharyngeal swab specimens and should not be used as a sole basis for treatment. Nasal washings and aspirates are unacceptable for Xpert Xpress SARS-CoV-2/FLU/RSV testing.  Fact Sheet for Patients: BloggerCourse.com  Fact Sheet for Healthcare Providers: SeriousBroker.it  This test is not yet approved or cleared by the [place] FDA and has been authorized for detection and/or diagnosis of SARS-CoV-2 by FDA under an Emergency Use Authorization (EUA). This EUA will remain in effect (meaning this test can be used) for the duration of the COVID-19 declaration under Section 564(b)(1) of the Act, 21 U.S.C. section 360bbb-3(b)(1), unless the authorization is terminated or revoked.  Performed at [facility], [address], [place], [place] [address]      Labs: BNP (last 3 results) Recent Labs    03/16/21 1000  BNP 1,291.0*   Basic Metabolic Panel: Recent Labs  Lab 03/15/21 1815 03/16/21 1000 03/16/21 1431 03/17/21 0631 03/18/21 0603 03/19/21 0609  NA 138  --  135 139 138 139  K 4.1  --  4.2 4.5 4.7 4.2  CL 96*  --  95* 97* 93* 94*  CO2 30  --  31 34* 34* 34*  GLUCOSE  123*  --  167* 157* 142* 141*  BUN 16  --  17 18 21* 22*  CREATININE 0.70  --  0.53 0.61 0.70 0.70  CALCIUM 8.5*  --  8.2* 8.8* 8.9 8.9  MG  --  1.9 1.9 2.1 2.1  --   PHOS  --   --  3.2  --   --   --    Liver Function Tests: Recent Labs  Lab 03/16/21 1431  AST 27  ALT 19  ALKPHOS 94  BILITOT 1.4*  PROT 7.9  ALBUMIN 3.3*   No results for input(s): LIPASE, AMYLASE in the last 168 hours. No results for input(s): AMMONIA in the last 168 hours. CBC: Recent Labs  Lab 03/15/21 1815 03/16/21 1431  WBC 11.3* 9.7  HGB 15.0 15.0  HCT 49.3* 48.6*  MCV 92.8 92.7  PLT 324 285   Cardiac Enzymes: No results for input(s): CKTOTAL, CKMB, CKMBINDEX, TROPONINI in the last 168 hours. BNP: Invalid input(s): POCBNP CBG: No results for input(s): GLUCAP in the last 168 hours. D-Dimer No results for input(s): DDIMER in the last 72 hours. Hgb A1c No results for input(s): HGBA1C in the last 72 hours. Lipid Profile No results for input(s): CHOL, HDL, LDLCALC, TRIG, CHOLHDL, LDLDIRECT in the last 72 hours. Thyroid function studies No results for input(s): TSH, T4TOTAL, T3FREE, THYROIDAB in the last 72 hours.  Invalid input(s):  FREET3 Anemia work up No results for input(s): VITAMINB12, FOLATE, FERRITIN, TIBC, IRON, RETICCTPCT in the last 72 hours. Urinalysis    Component Value Date/Time   COLORURINE YELLOW 08/18/2011 1320   APPEARANCEUR HAZY (A) 08/18/2011 1320   LABSPEC >1.030 (H) 08/18/2011 1320   PHURINE 5.5 08/18/2011 1320   GLUCOSEU 100 (A) 08/18/2011 1320   HGBUR LARGE (A) 08/18/2011 1320   BILIRUBINUR NEGATIVE 08/18/2011 1320   KETONESUR NEGATIVE 08/18/2011 1320   PROTEINUR 100 (A) 08/18/2011 1320   UROBILINOGEN 2.0 (H) 08/18/2011 1320   NITRITE POSITIVE (A) 08/18/2011 1320   LEUKOCYTESUR NEGATIVE 08/18/2011 1320   Sepsis Labs Invalid input(s): PROCALCITONIN,  WBC,  LACTICIDVEN Microbiology Recent Results (from the past 240 hour(s))  Resp Panel by RT-PCR (Flu A&B, Covid) Nasopharyngeal Swab     Status: None   Collection Time: 03/15/21  9:45 PM   Specimen: Nasopharyngeal Swab; Nasopharyngeal(NP) swabs in vial transport medium  Result Value Ref Range Status   SARS Coronavirus 2 by RT PCR NEGATIVE NEGATIVE Final    Comment: (NOTE) SARS-CoV-2 target nucleic acids are NOT DETECTED.  The SARS-CoV-2 RNA is generally detectable in upper respiratory specimens during the acute phase of infection. The lowest concentration of SARS-CoV-2 viral copies this assay can detect is 138 copies/mL. A negative result does not preclude SARS-Cov-2 infection and should not be used as the sole basis for treatment or other patient management decisions. A negative result may occur with  improper specimen collection/handling, submission of specimen other than nasopharyngeal swab, presence of viral mutation(s) within the areas targeted by this assay, and inadequate number of viral copies(<138 copies/mL). A negative result must be combined with clinical observations, patient history, and epidemiological information. The expected result is Negative.  Fact Sheet for Patients:   BloggerCourse.com  Fact Sheet for Healthcare Providers:  SeriousBroker.it  This test is no t yet approved or cleared by the [place] FDA and  has been authorized for detection and/or diagnosis of SARS-CoV-2 by FDA under an Emergency Use Authorization (EUA). This EUA will remain  in effect (meaning this test can be  used) for the duration of the COVID-19 declaration under Section 564(b)(1) of the Act, 21 U.S.C.section 360bbb-3(b)(1), unless the authorization is terminated  or revoked sooner.       Influenza A by PCR NEGATIVE NEGATIVE Final   Influenza B by PCR NEGATIVE NEGATIVE Final    Comment: (NOTE) The Xpert Xpress SARS-CoV-2/FLU/RSV plus assay is intended as an aid in the diagnosis of influenza from Nasopharyngeal swab specimens and should not be used as a sole basis for treatment. Nasal washings and aspirates are unacceptable for Xpert Xpress SARS-CoV-2/FLU/RSV testing.  Fact Sheet for Patients: BloggerCourse.comhttps://www.fda.gov/media/152166/download  Fact Sheet for Healthcare Providers: SeriousBroker.ithttps://www.fda.gov/media/152162/download  This test is not yet approved or cleared by [place] FDA and has been authorized for detection and/or diagnosis of SARS-CoV-2 by FDA under an Emergency Use Authorization (EUA). This EUA will remain in effect (meaning this test can be used) for the duration of the COVID-19 declaration under Section 564(b)(1) of the Act, 21 U.S.C. section 360bbb-3(b)(1), unless the authorization is terminated or revoked.  Performed at [facility], [address] [place], [place] [id number]      Time coordinating discharge: 35mins  SIGNED:   Erick BlinksJehanzeb Makailee Nudelman, MD  [facility] 03/20/2021, 10:40 AM   If 7PM-7AM, please contact night-coverage [email or web address]

## 2021-03-20 NOTE — Plan of Care (Signed)

## 2021-03-20 NOTE — Consult Note (Addendum)
Cardiology Consultation:   Patient ID: Holly KocherSusan R Chesterfield MRN: [id number]; DOB: 1971-04-19  Admit date: 03/15/2021 Date of Consult: 03/20/2021  PCP:  Oneita HurtPcp, No   [facility] Providers Cardiologist: [facility] Dr. Wyline MoodBranch  Patient Profile:   Holly KocherSusan R Cargo is a 50 y.o. female with a past medical history of anxiety, depression and tobacco use who is being seen today for the evaluation of new cardiomyopathy at the request of Dr. Kerry HoughMemon.  History of Present Illness:   Ms. Bettey CostaMangum presented to [facility] on 03/15/2021 for evaluation of worsening weakness and dyspnea for the past several days. Oxygen saturations were initially in the 80's on room air improved with 2 L nasal cannula. Initial labs showed WBC 11.3, Hgb 15.0, platelets 324, Na+ 138, K+ 4.1 and creatinine 0.70. TSH 0.486. COVID negative. UDS pending. BNP 1291. EKG shows NSR, HR 91 with RAE and no prior tracings are available for comparison. CXR showed mild bronchitic changes. CT Chest showed numerous ill-defined groundglass nodules throughout the upper and lower lobes which may represent a sequela of atypical infection but differential could also include metastatic disease to the chest and respiratory bronchiolitis. Was also noted to have a nodule adjacent to the left adrenal with follow-up imaging recommended. Her CT was reviewed with pulmonology who recommended outpatient follow-up and repeat imaging in 4 to 6 weeks.  She was admitted for acute hypoxic respiratory failure in the setting of a likely COPD exacerbation. Was started on scheduled nebulizers and IV steroids. Given her volume overload, she did receive intermittent doses of IV Lasix with most recent dose being on 03/18/2021. Echocardiogram yesterday showed a reduced EF of 35 to 40% with wall motion abnormalities including the anterior wall, anterior lateral wall and inferior septum being hypokinetic. RV function was mildly reduced and she was noted to have trivial MR.  In talking  with the patient today, she reports an episode last week during which her "entire body felt like jelly" and she went to the floor. She is unaware if she lost consciousness and says she landed on her buttocks. Denies any associated chest pain or palpitations at that time. Since then, she has noticed more shortness of breath at rest or with activity along with progressive wheezing. Reports that her symptoms have improved with treatments this admission. She did quit smoking approximately 2 months ago and had noticed improvement in her dyspnea during that time.  She is not overly active at baseline but denies any recent chest pain or palpitations. No specific orthopnea or PND. She has experienced intermittent lower extremity edema over the past 6+ months but reports symptoms would typically resolve after elevating her legs overnight.  She is unaware of any personal history of CAD, CHF or cardiac arrhythmias. Reports her maternal grandmother and maternal aunt have CHF. She denies any significant caffeine use and no alcohol use.  Reports a history of drug use in her teens but no recent use.  Past Medical History:  Diagnosis Date   Anxiety    Depression    PTSD (post-traumatic stress disorder)     Past Surgical History:  Procedure Laterality Date   BACK SURGERY     CESAREAN SECTION       Home Medications:  Prior to Admission medications   Medication Sig Start Date End Date Taking? Authorizing Provider  albuterol (VENTOLIN HFA) 108 (90 Base) MCG/ACT inhaler Inhale 2 puffs into the lungs every 6 (six) hours as needed for wheezing or shortness of breath.  03/19/21  Yes Erick Blinks, MD  budesonide-formoterol (SYMBICORT) 80-4.5 MCG/ACT inhaler Inhale 2 puffs into the lungs in the morning and at bedtime. 03/19/21  Yes Erick Blinks, MD  furosemide (LASIX) 20 MG tablet Take 1 tablet (20 mg total) by mouth daily. 03/19/21 03/19/22 Yes Erick Blinks, MD  predniSONE (DELTASONE) 10 MG tablet Take 40mg  po daily  for 2 days then 30mg  daily for 2 days then 20mg  daily for 2 days then 10mg  daily for 2 days then stop 03/19/21  Yes , MD  amoxicillin (AMOXIL) 500 MG capsule Take 1 capsule (500 mg total) by mouth 3 (three) times daily. Patient not taking: Reported on 03/15/2021 09/27/15   Muthersbaugh, 05/19/21, PA-C  busPIRone (BUSPAR) 7.5 MG tablet Take 1 tablet (7.5 mg total) by mouth 2 (two) times daily. 03/19/21   03/17/2021, MD  citalopram (CELEXA) 20 MG tablet Take 1 tablet (20 mg total) by mouth daily. Patient not taking: Reported on 03/15/2021 11/04/13   05/19/21, FNP  clonazePAM (KLONOPIN) 0.5 MG tablet Take 1 tablet (0.5 mg total) by mouth 3 (three) times daily as needed (anxiety). Patient not taking: Reported on 03/15/2021 11/04/13   11/06/13, FNP  cyclobenzaprine (FLEXERIL) 10 MG tablet Take 1 tablet (10 mg total) by mouth 2 (two) times daily as needed for muscle spasms. Patient not taking: Reported on 03/15/2021 10/22/14   11/06/13, PA-C  ibuprofen (ADVIL,MOTRIN) 600 MG tablet Take 1 tablet (600 mg total) by mouth every 6 (six) hours as needed. Patient not taking: Reported on 03/15/2021 10/22/14   12/22/14, PA-C  ibuprofen (ADVIL,MOTRIN) 800 MG tablet Take 1 tablet (800 mg total) by mouth 3 (three) times daily. Patient not taking: Reported on 03/15/2021 02/01/16   Barrett, 12/22/14, PA-C  methocarbamol (ROBAXIN) 500 MG tablet Take 1 tablet (500 mg total) by mouth 2 (two) times daily. Patient not taking: Reported on 03/15/2021 02/01/16   Barrett, 02/03/16, PA-C  oxyCODONE-acetaminophen (PERCOCET/ROXICET) 5-325 MG per tablet Take 1-2 tablets by mouth every 6 (six) hours as needed for severe pain. Patient not taking: Reported on 03/15/2021 10/22/14   02/03/16, PA-C  pantoprazole (PROTONIX) 40 MG tablet Take 1 tablet (40 mg total) by mouth daily. 03/20/21   03/17/2021, MD  traZODone (DESYREL) 100 MG tablet Take 1 tablet (100 mg total) by mouth at bedtime as  needed for sleep. Patient not taking: Reported on 03/15/2021 11/04/13   05/20/21, FNP    Inpatient Medications: Scheduled Meds:  budesonide (PULMICORT) nebulizer solution  0.5 mg Nebulization BID   busPIRone  5 mg Oral BID   enoxaparin (LOVENOX) injection  40 mg Subcutaneous Q24H   Living Better with Heart Failure Book   Does not apply Once   pantoprazole  40 mg Oral Daily   polyethylene glycol  17 g Oral Daily   predniSONE  40 mg Oral Q breakfast   senna  2 tablet Oral Daily   Continuous Infusions:  PRN Meds: acetaminophen, ipratropium-albuterol, ondansetron (ZOFRAN) IV  Allergies:    Allergies  Allergen Reactions   Cranberry Hives   Hydrocodone-Acetaminophen Other (See Comments)    Reaction : migraines    Social History:   Social History   Socioeconomic History   Marital status: Single    Spouse name: Not on file   Number of children: Not on file   Years of education: Not on file   Highest education level: Not on file  Occupational History   Not on file  Tobacco Use   Smoking status: Every Day    Packs/day: 0.50    Types: Cigarettes   Smokeless tobacco: Not on file  Substance and Sexual Activity   Alcohol use: No   Drug use: No    Types: Other-see comments    Comment: Pills- states she will be clean x1 year in Feb 2013   Sexual activity: Yes    Birth control/protection: None, Abstinence  Other Topics Concern   Not on file  Social History Narrative   Not on file   Social Determinants of Health   Financial Resource Strain: Not on file  Food Insecurity: Not on file  Transportation Needs: Not on file  Physical Activity: Not on file  Stress: Not on file  Social Connections: Not on file  Intimate Partner Violence: Not on file    Family History:    Family History  Problem Relation Age of Onset   Hypertension Mother    Cancer Maternal Grandmother    Heart disease Maternal Grandmother    Heart disease Maternal Grandfather      ROS:   Please see the history of present illness.   All other ROS reviewed and negative.     Physical Exam/Data:   Vitals:   03/19/21 1318 03/19/21 1940 03/19/21 2147 03/20/21 0600  BP: 135/79  132/66 138/82  Pulse: 87  83 80  Resp: Temp: 98.8 F (37.1 C)  98.3 F (36.8 C) 97.7 F (36.5 C)  TempSrc: Oral   Oral  SpO2: 93% 92% 93% 93%  Weight:      Height:        Intake/Output Summary (Last 24 hours) at 03/20/2021 0742 Last data filed at 03/19/2021 1300 Gross per 24 hour  Intake 120 ml  Output 400 ml  Net -280 ml   Last 3 Weights 03/15/2021 10/31/2013 08/18/2011  Weight (lbs) 220 lb 190 lb 212 lb  Weight (kg) 99.791 kg 86.183 kg 96.163 kg  Some encounter information is confidential and restricted. Go to Review Flowsheets activity to see all data.     Body mass index is 38.97 kg/m.  General:  Well nourished, well developed, in no acute distress. HEENT: normal Lymph: no adenopathy Neck: no JVD Endocrine:  No thryomegaly Vascular: No carotid bruits; FA pulses 2+ bilaterally without bruits  Cardiac:  normal S1, S2 with occasional ectopic beats; RRR; no murmur. Lungs: mild expiratory wheeze along upper lung fields. No rales or rhonchi.  Abd: soft, nontender, no hepatomegaly  Ext: no pitting edema Musculoskeletal:  No deformities, BUE and BLE strength normal and equal Skin: warm and dry  Neuro:  CNs 2-12 intact, no focal abnormalities noted Psych:  Normal affect   EKG:  The EKG was personally reviewed and demonstrates: NSR, HR 91 with RAE and no prior tracings are available for comparison.   Telemetry:  Not connected to telemetry.   Relevant CV Studies:  Echocardiogram: 03/19/2021 IMPRESSIONS     1. Left ventricular ejection fraction, by estimation, is 35 to 40%. The  left ventricle has moderately decreased function. The left ventricle  demonstrates regional wall motion abnormalities (see scoring  diagram/findings for description). Left ventricular   diastolic  parameters were normal.   2. Right ventricular systolic function is mildly reduced. The right  ventricular size is normal. Tricuspid regurgitation signal is inadequate  for assessing PA pressure.   3. The mitral valve is grossly normal. Trivial mitral valve  regurgitation.   4. The aortic valve was  not well visualized. There is mild calcification  of the aortic valve. Aortic valve regurgitation is not visualized. Aortic  valve mean gradient measures 4.0 mmHg.   5. The inferior vena cava is normal in size with greater than 50%  respiratory variability, suggesting right atrial pressure of 3 mmHg.   Comparison(s): No prior Echocardiogram.   Laboratory Data:  High Sensitivity Troponin:  No results for input(s): TROPONINIHS in the last 720 hours.   Chemistry Recent Labs  Lab 03/17/21 0631 03/18/21 0603 03/19/21 0609  NA 139 138 139  K 4.5 4.7 4.2  CL 97* 93* 94*  CO2 34* 34* 34*  GLUCOSE 157* 142* 141*  BUN 18 21* 22*  CREATININE 0.61 0.70 0.70  CALCIUM 8.8* 8.9 8.9  GFRNONAA >60 >60 >60  ANIONGAP Recent Labs  Lab 03/16/21 1431  PROT 7.9  ALBUMIN 3.3*  AST 27  ALT 19  ALKPHOS 94  BILITOT 1.4*   Hematology Recent Labs  Lab 03/15/21 1815 03/16/21 1431  WBC 11.3* 9.7  RBC 5.31* 5.24*  HGB 15.0 15.0  HCT 49.3* 48.6*  MCV 92.8 92.7  MCH 28.2 28.6  MCHC 30.4 30.9  RDW 14.6 14.6  PLT 324 285   BNP Recent Labs  Lab 03/16/21 1000  BNP 1,291.0*    DDimer No results for input(s): DDIMER in the last 168 hours.   Radiology/Studies:  CT CHEST WO CONTRAST  Result Date: 03/16/2021 CLINICAL DATA:  Dyspnea, chronic dyspnea of unclear etiology in a patient with history of COPD. EXAM: CT CHEST WITHOUT CONTRAST TECHNIQUE: Multidetector CT imaging of the chest was performed following the standard protocol without IV contrast. COMPARISON:  Chest x-ray from March 15, 2021. FINDINGS: Cardiovascular: Calcified atheromatous plaque, mild in the thoracic aorta. No  aneurysmal dilation. Main pulmonary artery 3.8 cm. Heart size normal without substantial pericardial effusion. Mediastinum/Nodes: Esophagus grossly normal. No gross hilar adenopathy. No mediastinal adenopathy. Scattered lymph nodes throughout the chest without pathologic enlargement. Lungs/Pleura: Diffuse mild septal thickening, basilar atelectasis and areas of ground-glass. Multinodular appearance of lung parenchyma. Numerous ill-defined ground-glass nodules throughout upper and lower lobes. Septal thickening is mildly irregular. No effusion or consolidation. LEFT lower lobe nodule 6 mm (image 105/4) (Image 67/4) 10 mm ground-glass nodule in the RIGHT upper lobe. Innumerable other scattered pulmonary nodules throughout the chest. Upper Abdomen: Limited assessment of upper abdominal contents showing no acute process. Nodule adjacent to the LEFT adrenal and stomach is ovoid measuring 13 mm. Isodense to the spleen. This is immediately adjacent to the LEFT adrenal. No upper abdominal lymphadenopathy. Musculoskeletal: No acute musculoskeletal process. Spinal degenerative changes. No destructive bone finding. IMPRESSION: 1. Numerous ill-defined ground-glass nodules throughout upper and lower lobes. Septal thickening is mildly irregular. Findings may represent sequela of atypical infection. This is also seen on a background of mildly irregular septal thickening. Differential considerations would include metastatic disease to the chest and respiratory bronchiolitis. Pulmonary consultation may be helpful, close follow-up is suggested. 2. Given the presence of septal thickening would also correlate with any clinical or laboratory evidence of heart failure in the background. 3. Scattered mildly prominent lymph nodes in the chest may be reactive, attention on follow-up. 4. Nodule adjacent the LEFT adrenal favored to represent a small splenule. Consider follow-up dedicated abdominal imaging. 5. Main pulmonary artery 3.8 cm, can  be seen in the setting of pulmonary arterial hypertension. 6. Aortic atherosclerosis. Aortic Atherosclerosis (ICD10-I70.0). Electronically Signed   By: Jason Fila.D.  On: 03/16/2021 11:44   US Venous Img Lower Bilateral (DVT)  Result Date: 03/16/2021 CLINICAL DATA:  50 year old female with and intermittent bilateral lower extremity edema EXAM: BILATERAL LOWER EXTREMITY VENOUS DOPPLER ULTRASOUND TECHNIQUE: Gray-scale sonography with compression, as well as color and duplex ultrasound, were performed to evaluate the deep venous system(s) from the level of the common femoral vein through the popliteal and proximal calf veins. COMPARISON:  None. FINDINGS: VENOUS Normal compressibility of the common femoral, superficial femoral, and popliteal veins, as well as the visualized calf veins. Visualized portions of profunda femoral vein and great saphenous vein unremarkable. No filling defects to suggest DVT on grayscale or color Doppler imaging. Doppler waveforms show normal direction of venous flow, normal respiratory plasticity and response to augmentation. OTHER Diffuse bilateral soft tissue edema. Limitations: none IMPRESSION: No femoropopliteal DVT nor evidence of DVT within the visualized calf veins of either lower extremity. If clinical symptoms are inconsistent or if there are persistent or worsening symptoms, further imaging (possibly involving the iliac veins) may be warranted. Roanna Banning, MD [facility] Electronically Signed   By: Roanna Banning MD   On: 03/16/2021 09:55     Assessment and Plan:   1. HFrEF/New Cardiomyopathy - Presents with a 6+ month history of intermittent lower extremity edema and presented with worsening dyspnea for the past week and treated for a COPD exacerbation. She did have a fall prior to admission but no clear syncope at that time and no associated symptoms.  - BNP elevated to 1291 on admission and echo shows a  reduced EF of 35 to 40% with wall motion abnormalities including the anterior wall, anterior lateral wall and inferior septum being hypokinetic. RV function mildly reduced.  - Reviewed echo findings in detail with the patient. She wishes to go home today if able so will discuss with MD in regards to starting medical therapy and titrating as an outpatient with plans for a repeat echo in 3 months. We reviewed if her EF remains reduced, she would require ischemic evaluation at that time.  - Currently prescribed by the admitting team to start Lasix 20mg  daily at discharge. Would plan to add low-dose cardioselective BB such as Toprol-XL 12.5mg  daily and losartan 12.5mg  with plans to transition to entresto as outpatient if tolerated. Also consider SGLT2 inhibitor as an outpatient and possible addition of Spironolactone as well if BP allows. We also reviewed the importance of sodium and fluid restriction.  2. Abnormal Chest CT - Chest CT this admission showed numerous ill-defined groundglass nodules throughout the upper and lower lobes which may represent a sequela of atypical infection but differential could also include metastatic disease to the chest and respiratory bronchiolitis. Was also noted to have a nodule adjacent to the left adrenal. Was reviewed with Pulmonology by the admitting team with plans for outpatient follow-up and repeat imaging in 4-6 weeks.   3. Prediabetes - Hgb A1c at 6.2 this admission. Dietary changes have been reviewed with the patient. She needs to establish with a PCP as an outpatient.   4. History of Tobacco Use - She did quit smoking 2 months ago and was congratulated on this.    Risk Assessment/Risk Scores:       New York Heart Association (NYHA) Functional Class NYHA Class II  For questions or updates, please contact [facility] Please consult [email or web address] for contact info under    Signed, , PA-C  03/20/2021 7:42 AM  Patient seen and  discussed with PA Strader, I agree with her documentation. 50 yo female no prior cardiac history admitted with generalized weakness and SOB. Initial presentation concerning for bronchitis and copd exacerbation, managed with steroids and nebs. Some signs of fluid overload, echo was ordered which showed LVE 35-40%. Cardiology consulted to help manage new onset systolic HF.   Admit labs K 4.1 Cr 0.70 BUN 16 WBC 11.3 Hgb 15 Plt 324 TSH 0.486 Mg 1.9 BNP 1291 Procalc <0.10  COVID neg EKG SR, no ischemic changes  CXR mild bronchitic changes LE venous US no DVT CT chest: Numerous ill-defined ground-glass nodules throughout upper and lower lobes  Echo: LVEF 35-40%, mild RV dysfunction, multiple WMAs    New diagnosis of systolic HF. I/Os are incomplete this admission, no recent weights. Has received intermittent IV lasix, renal function has been stable. Would start toprol 12.5mg  daily, losartan 12.5mg  daily. Titrate at f/u, if tolerates could transition to entresto as outpatient. Consider aldactone and farxiga in the near future. SHe will need ischemic evaluation, she is wanting strongly to go home today and there is not an urgent indication at this time, reconsider timing at outpatient follow up.   Dina Rich MD

## 2021-04-10 ENCOUNTER — Encounter: Payer: Self-pay | Admitting: Student

## 2021-04-10 ENCOUNTER — Other Ambulatory Visit: Payer: Self-pay

## 2021-04-10 ENCOUNTER — Ambulatory Visit (INDEPENDENT_AMBULATORY_CARE_PROVIDER_SITE_OTHER): Payer: Self-pay | Admitting: Student

## 2021-04-10 VITALS — BP 118/82 | HR 68 | Ht 63.0 in | Wt 249.0 lb

## 2021-04-10 DIAGNOSIS — Z79899 Other long term (current) drug therapy: Secondary | ICD-10-CM

## 2021-04-10 DIAGNOSIS — I5022 Chronic systolic (congestive) heart failure: Secondary | ICD-10-CM

## 2021-04-10 DIAGNOSIS — R9389 Abnormal findings on diagnostic imaging of other specified body structures: Secondary | ICD-10-CM

## 2021-04-10 DIAGNOSIS — J449 Chronic obstructive pulmonary disease, unspecified: Secondary | ICD-10-CM

## 2021-04-10 DIAGNOSIS — R7303 Prediabetes: Secondary | ICD-10-CM

## 2021-04-10 MED ORDER — LOSARTAN POTASSIUM 25 MG PO TABS: 12.5000 mg | ORAL_TABLET | Freq: Every day | ORAL | 2 refills | Status: DC

## 2021-04-10 MED ORDER — METOPROLOL SUCCINATE ER 25 MG PO TB24: 25.0000 mg | ORAL_TABLET | Freq: Every day | ORAL | 1 refills | Status: DC

## 2021-04-10 NOTE — Patient Instructions (Addendum)
Medication Instructions:   INCREASE Toprol XL to 25 mg daily   *If you need a refill on your cardiac medications before your next appointment, please call your pharmacy*   Lab Work:  BMET in 2 weeks (9/9)  If you have labs (blood work) drawn today and your tests are completely normal, you will receive your results only by: MyChart Message (if you have MyChart) OR A paper copy in the mail If you have any lab test that is abnormal or we need to change your treatment, we will call you to review the results.   Testing/Procedures: None today    Follow-Up: At [facility], you and your health needs are our priority.  As part of our continuing mission to provide you with exceptional heart care, we have created designated Provider Care Teams.  These Care Teams include your primary Cardiologist (physician) and Advanced Practice Providers (APPs -  Physician Assistants and Nurse Practitioners) who all work together to provide you with the care you need, when you need it.  We recommend signing up for the patient portal called "MyChart".  Sign up information is provided on this After Visit Summary.  MyChart is used to connect with patients for Virtual Visits (Telemedicine).  Patients are able to view lab/test results, encounter notes, upcoming appointments, etc.  Non-urgent messages can be sent to your provider as well.   To learn more about what you can do with MyChart, go to [email or web address].    Your next appointment:   6 week(s)  The format for your next appointment:   In Person  Provider:   Dina Rich, MD, Randall An, PA-C, or Jacolyn Reedy, PA-C   Other Instructions Keep a daily blood pressure log and call with readings in 2-3 weeks.

## 2021-04-10 NOTE — Progress Notes (Addendum)
Cardiology Office Note    Date:  04/10/2021   ID:  Holly Fuentes, DOB [birth year], MRN [id number]  PCP:  Oneita Hurt, No  Cardiologist: Dina Rich, MD    Chief Complaint  Patient presents with   Hospitalization Follow-up     History of Present Illness:    Holly Fuentes is a 50 y.o. female with past medical history of anxiety, depression, tobacco use and newly-diagnosed cardiomyopathy (EF 35-40% by echo in 03/2021) who presents to the office today for hospital follow-up.   She most recently presented to [facility] ED on 03/15/2021 for evaluation of worsening dyspnea on exertion and was found to have an acute COPD exacerbation and CHF exacerbation. Her BNP was elevated to 1291 and CT Chest showed numerous ill-defined groundglass nodules which may represent atypical infection but differential could also include metastatic disease and Pulmonology recommended outpatient follow-up and repeat imaging in 4 to 6 weeks. An echocardiogram was obtained and showed a reduced EF of 35 to 40% with anterior wall, anterior lateral wall and inferior septum being hypokinetic. She denied any associated chest pain or palpitations but had experienced worsening lower extremity edema for 6+ months. She wished to be discharged, therefore she was started on Toprol-XL 12.5 mg daily, Losartan 12.5 and Lasix 20 mg daily with plans to transition Losartan to [facility] as an outpatient if BP allowed and could also add an SGLT2 inhibitor. Was recommended to consider ischemic evaluation as an outpatient.  In talking with the patient and her mother today, she reports overall feeling better since her recent hospitalization. She still gets short of breath with activity but says this has overall improved. No recurrent orthopnea or PND. Still with some lower extremity edema but not as severe as during her admission. She denies any recent chest pain or palpitations.  She reports good compliance with her medications and denies any  known side effects. She does not currently have a blood pressure cuff at home.   Past Medical History:  Diagnosis Date   Anxiety    CHF (congestive heart failure) (HCC)    a.EF 35-40% by echo in 03/2021   Depression    PTSD (post-traumatic stress disorder)     Past Surgical History:  Procedure Laterality Date   BACK SURGERY     CESAREAN SECTION      Current Medications: Outpatient Medications Prior to Visit  Medication Sig Dispense Refill   albuterol (VENTOLIN HFA) 108 (90 Base) MCG/ACT inhaler Inhale 2 puffs into the lungs every 6 (six) hours as needed for wheezing or shortness of breath. 8 g 2   budesonide-formoterol (SYMBICORT) 80-4.5 MCG/ACT inhaler Inhale 2 puffs into the lungs in the morning and at bedtime. 1 each 12   busPIRone (BUSPAR) 7.5 MG tablet Take 1 tablet (7.5 mg total) by mouth 2 (two) times daily. 60 tablet 0   furosemide (LASIX) 20 MG tablet Take 1 tablet (20 mg total) by mouth daily. 30 tablet 11   pantoprazole (PROTONIX) 40 MG tablet Take 1 tablet (40 mg total) by mouth daily. 30 tablet 0   losartan (COZAAR) 25 MG tablet Take 0.5 tablets (12.5 mg total) by mouth daily. 30 tablet 1   metoprolol succinate (TOPROL-XL) 25 MG 24 hr tablet Take 0.5 tablets (12.5 mg total) by mouth daily. 30 tablet 1   predniSONE (DELTASONE) 10 MG tablet Take 40mg  po daily for 2 days then 30mg  daily for 2 days then 20mg  daily for 2 days then 10mg  daily for 2 days  then stop (Patient not taking: Reported on 04/10/2021) 20 tablet 0   No facility-administered medications prior to visit.     Allergies:   Cranberry and Hydrocodone-acetaminophen   Social History   Socioeconomic History   Marital status: Single    Spouse name: Not on file   Number of children: Not on file   Years of education: Not on file   Highest education level: Not on file  Occupational History   Not on file  Tobacco Use   Smoking status: Former    Packs/day: 0.50    Types: Cigarettes    Quit date: 11/14/2020     Years since quitting: 0.4   Smokeless tobacco: Never  Vaping Use   Vaping Use: Never used  Substance and Sexual Activity   Alcohol use: No   Drug use: No    Types: Other-see comments    Comment: Pills- states she will be clean x1 year in Feb 2013   Sexual activity: Yes    Birth control/protection: None, Abstinence  Other Topics Concern   Not on file  Social History Narrative   Not on file   Social Determinants of Health   Financial Resource Strain: Not on file  Food Insecurity: Not on file  Transportation Needs: Not on file  Physical Activity: Not on file  Stress: Not on file  Social Connections: Not on file     Family History:  The patient's family history includes Cancer in her maternal grandmother; Heart disease in her maternal grandfather and maternal grandmother; Hypertension in her mother.   Review of Systems:    Please see the history of present illness.     All other systems reviewed and are otherwise negative except as noted above.   Physical Exam:    VS:  BP 118/82   Pulse 68   Ht 5\' 3"  (1.6 m)   Wt 249 lb (112.9 kg)   SpO2 94%   BMI 44.11 kg/m    General: Well developed, well nourished,female appearing in no acute distress. Head: Normocephalic, atraumatic. Neck: No carotid bruits. JVD not elevated.  Lungs: Respirations regular and unlabored, without wheezes or rales.  Heart: Regular rate and rhythm. No S3 or S4.  No murmur, no rubs, or gallops appreciated. Abdomen: Appears non-distended. No obvious abdominal masses. Msk:  Strength and tone appear normal for age. No obvious joint deformities or effusions. Extremities: No clubbing or cyanosis. Trace ankle edema bilaterally.  Distal pedal pulses are 2+ bilaterally. Neuro: Alert and oriented X 3. Moves all extremities spontaneously. No focal deficits noted. Psych:  Responds to questions appropriately with a normal affect. Skin: No rashes or lesions noted  Wt Readings from Last 3 Encounters:   04/10/21 249 lb (112.9 kg)  03/15/21 220 lb (99.8 kg)  10/31/13 190 lb (86.2 kg)    Patient reports her recorded weight in 02/2021 was an estimate and not her actual weight.   Studies/Labs Reviewed:   EKG:  EKG is not ordered today.   Recent Labs: 03/16/2021: ALT 19; B Natriuretic Peptide 1,291.0; Hemoglobin 15.0; Platelets 285; TSH 0.486 03/18/2021: Magnesium 2.1 03/19/2021: BUN 22; Creatinine, Ser 0.70; Potassium 4.2; Sodium 139   Lipid Panel No results found for: CHOL, TRIG, HDL, CHOLHDL, VLDL, LDLCALC, LDLDIRECT  Additional studies/ records that were reviewed today include:   Echocardiogram: 03/19/2021 IMPRESSIONS     1. Left ventricular ejection fraction, by estimation, is 35 to 40%. The  left ventricle has moderately decreased function. The left ventricle  demonstrates regional  wall motion abnormalities (see scoring  diagram/findings for description). Left ventricular   diastolic parameters were normal.   2. Right ventricular systolic function is mildly reduced. The right  ventricular size is normal. Tricuspid regurgitation signal is inadequate  for assessing PA pressure.   3. The mitral valve is grossly normal. Trivial mitral valve  regurgitation.   4. The aortic valve was not well visualized. There is mild calcification  of the aortic valve. Aortic valve regurgitation is not visualized. Aortic  valve mean gradient measures 4.0 mmHg.   5. The inferior vena cava is normal in size with greater than 50%  respiratory variability, suggesting right atrial pressure of 3 mmHg.   Comparison(s): No prior Echocardiogram.   Assessment:    1. Chronic systolic heart failure (HCC)   2. Medication management   3. Abnormal chest CT   4. Chronic obstructive pulmonary disease, unspecified COPD type (HCC)   5. Prediabetes      Plan:   In order of problems listed above:  1. HFrEF - This is a new diagnosis for the patient during her most recent admission for a COPD exacerbation  and she also reports being under increased stress at that time. Her volume status has improved since returning home but she has not been following daily weights. I encouraged her to do so. She has trace lower extremity edema on examination today but lungs are clear. - Currently on Lasix 20 mg daily, Losartan 12.5 mg daily and Toprol-XL 12.5 mg daily. Will titrate Toprol-XL to 25mg  daily. Will recheck a BMET for reassessment of renal function and electrolytes. If overall stable, would try to switch Losartan to [facility] or add Spironolactone. She is currently without insurance coverage and I will ask our nurses to check on patient assistance options for Entresto. SGLT2 inhibitor would also be an option if approved for patient assistance. She was encouraged to follow BP at home and report back with results. - She continues to deny any recent anginal symptoms and would continue to titrate medical therapy at this time until she has been evaluated by Pulmonology given her abnormal CT. If she develops any symptoms or EF remains reduced by repeat imaging, would plan for ischemic evaluation at that time (this has been tentatively reviewed with the patient).  2. Abnormal Chest CT - CT during admission showed numerous ill-defined groundglass nodules which may represent atypical infection but differential could also include metastatic disease and Pulmonology recommended outpatient follow-up and repeat imaging in 4 to 6 weeks.  - Was encouraged to keep scheduled follow-up in 04/2021.  3. COPD - She is a former smoker but quit 2+ months ago. She was confused about inhaler use and we reviewed that Symbicort is to be used twice daily and Albuterol only as needed.  4. Prediabetes - Hgb A1c was at 6.2 during admission. She is scheduled to establish care with a PCP in 2 weeks in [place] per her report.    Medication Adjustments/Labs and Tests Ordered: Current medicines are reviewed at length with the patient today.   Concerns regarding medicines are outlined above.  Medication changes, Labs and Tests ordered today are listed in the Patient Instructions below. Patient Instructions  Medication Instructions:   INCREASE Toprol XL to 25 mg daily   *If you need a refill on your cardiac medications before your next appointment, please call your pharmacy*   Lab Work:  BMET in 2 weeks (9/9)  If you have labs (blood work) drawn today and your tests  are completely normal, you will receive your results only by: MyChart Message (if you have MyChart) OR A paper copy in the mail If you have any lab test that is abnormal or we need to change your treatment, we will call you to review the results.   Testing/Procedures: None today    Follow-Up: At [facility], you and your health needs are our priority.  As part of our continuing mission to provide you with exceptional heart care, we have created designated Provider Care Teams.  These Care Teams include your primary Cardiologist (physician) and Advanced Practice Providers (APPs -  Physician Assistants and Nurse Practitioners) who all work together to provide you with the care you need, when you need it.  We recommend signing up for the patient portal called "MyChart".  Sign up information is provided on this After Visit Summary.  MyChart is used to connect with patients for Virtual Visits (Telemedicine).  Patients are able to view lab/test results, encounter notes, upcoming appointments, etc.  Non-urgent messages can be sent to your provider as well.   To learn more about what you can do with MyChart, go to [email or web address].    Your next appointment:   6 week(s)  The format for your next appointment:   In Person  Provider:   Dina RichJonathan Branch, MD, Randall AnBrittany Jayquon Theiler, PA-C, or Jacolyn ReedyMichele Lenze, PA-C   Other Instructions Keep a daily blood pressure log and call with readings in 2-3 weeks.    Signed, Ellsworth LennoxBrittany M Keyra Virella, PA-C  04/10/2021 5:26 PM     [facility] [address] [place], [place] [address] Phone: [phone] Fax: [phone]

## 2021-04-13 ENCOUNTER — Telehealth: Payer: Self-pay

## 2021-04-13 NOTE — Telephone Encounter (Signed)
Patient was seen Friday by [place]. Patient was told if she needed assistance with her medication to let us know. Advised patient I would send note to clinical team to see what could be done.   Pt was told when she contacted the pharmacy that her medication was going to be over $50 and the pt stated she cannot afford this. I called the pharmacy and was quoted $24. I then let the pt know of the price change and she was relieved. She stated she could much better handle $24.

## 2021-04-15 ENCOUNTER — Telehealth: Payer: Self-pay

## 2021-04-15 MED ORDER — LOSARTAN POTASSIUM 25 MG PO TABS: 12.5000 mg | ORAL_TABLET | Freq: Every day | ORAL | 2 refills | Status: DC

## 2021-04-15 MED ORDER — METOPROLOL SUCCINATE ER 25 MG PO TB24: 25.0000 mg | ORAL_TABLET | Freq: Every day | ORAL | 5 refills | Status: DC

## 2021-04-15 NOTE — Telephone Encounter (Signed)
Medication refill request for 30 days per pts request d/t cost of medication

## 2021-04-30 ENCOUNTER — Other Ambulatory Visit (HOSPITAL_COMMUNITY): Payer: Self-pay | Admitting: Sports Medicine

## 2021-04-30 DIAGNOSIS — Z1231 Encounter for screening mammogram for malignant neoplasm of breast: Secondary | ICD-10-CM

## 2021-05-11 ENCOUNTER — Other Ambulatory Visit: Payer: Self-pay

## 2021-05-11 ENCOUNTER — Ambulatory Visit (HOSPITAL_COMMUNITY)
Admission: RE | Admit: 2021-05-11 | Discharge: 2021-05-11 | Disposition: A | Discharge status: Home / Self Care | Payer: Self-pay | Source: Ambulatory Visit | Attending: Sports Medicine | Admitting: Sports Medicine

## 2021-05-11 DIAGNOSIS — Z1231 Encounter for screening mammogram for malignant neoplasm of breast: Secondary | ICD-10-CM | POA: Insufficient documentation

## 2021-05-15 ENCOUNTER — Institutional Professional Consult (permissible substitution): Payer: Self-pay | Admitting: Pulmonary Disease

## 2021-06-10 NOTE — Progress Notes (Deleted)
Cardiology Office Note    Date:  06/10/2021   ID:  Holly Fuentes, DOB [birth year], MRN [id number]  PCP:  Oneita Hurt, No  Cardiologist: Dina Rich, MD    No chief complaint on file.   History of Present Illness:    Holly Fuentes is a 50 y.o. female with past medical history of anxiety, depression, tobacco use and newly-diagnosed cardiomyopathy (EF 35-40% by echo in 03/2021) who presents to the office today for 70-month follow-up.  She was examined by myself in 03/2021 following a recent hospitalization for a COPD exacerbation and CHF exacerbation. She reported overall doing well at the time of her follow-up visit and reported her dyspnea had improved. She was currently taking Lasix 20 mg daily, Losartan 12.5 mg daily and Toprol-XL 12.5 mg daily with this being titrated to 25 mg daily.  A follow-up BMET was ordered for consideration of switching to [facility] if renal function remained stable. It does not appear that this was obtained.  - BMET    Past Medical History:  Diagnosis Date   Anxiety    CHF (congestive heart failure) (HCC)    a.EF 35-40% by echo in 03/2021   Depression    PTSD (post-traumatic stress disorder)     Past Surgical History:  Procedure Laterality Date   BACK SURGERY     CESAREAN SECTION      Current Medications: Outpatient Medications Prior to Visit  Medication Sig Dispense Refill   albuterol (VENTOLIN HFA) 108 (90 Base) MCG/ACT inhaler Inhale 2 puffs into the lungs every 6 (six) hours as needed for wheezing or shortness of breath. 8 g 2   budesonide-formoterol (SYMBICORT) 80-4.5 MCG/ACT inhaler Inhale 2 puffs into the lungs in the morning and at bedtime. 1 each 12   busPIRone (BUSPAR) 7.5 MG tablet Take 1 tablet (7.5 mg total) by mouth 2 (two) times daily. 60 tablet 0   furosemide (LASIX) 20 MG tablet Take 1 tablet (20 mg total) by mouth daily. 30 tablet 11   losartan (COZAAR) 25 MG tablet Take 0.5 tablets (12.5 mg total) by mouth daily. 15 tablet 2    metoprolol succinate (TOPROL-XL) 25 MG 24 hr tablet Take 1 tablet (25 mg total) by mouth daily. 30 tablet 5   pantoprazole (PROTONIX) 40 MG tablet Take 1 tablet (40 mg total) by mouth daily. 30 tablet 0   No facility-administered medications prior to visit.     Allergies:   Cranberry and Hydrocodone-acetaminophen   Social History   Socioeconomic History   Marital status: Single    Spouse name: Not on file   Number of children: Not on file   Years of education: Not on file   Highest education level: Not on file  Occupational History   Not on file  Tobacco Use   Smoking status: Former    Packs/day: 0.50    Types: Cigarettes    Quit date: 11/14/2020    Years since quitting: 0.5   Smokeless tobacco: Never  Vaping Use   Vaping Use: Never used  Substance and Sexual Activity   Alcohol use: No   Drug use: No    Types: Other-see comments    Comment: Pills- states she will be clean x1 year in Feb 2013   Sexual activity: Yes    Birth control/protection: None, Abstinence  Other Topics Concern   Not on file  Social History Narrative   Not on file   Social Determinants of Health   Financial Resource Strain: Not on  file  Food Insecurity: Not on file  Transportation Needs: Not on file  Physical Activity: Not on file  Stress: Not on file  Social Connections: Not on file     Family History:  The patient's ***family history includes Cancer in her maternal grandmother; Heart disease in her maternal grandfather and maternal grandmother; Hypertension in her mother.   Review of Systems:    Please see the history of present illness.     All other systems reviewed and are otherwise negative except as noted above.   Physical Exam:    VS:  LMP 01/18/2016 (Approximate)    General: Well developed, well nourished,female appearing in no acute distress. Head: Normocephalic, atraumatic. Neck: No carotid bruits. JVD not elevated.  Lungs: Respirations regular and unlabored, without wheezes  or rales.  Heart: ***Regular rate and rhythm. No S3 or S4.  No murmur, no rubs, or gallops appreciated. Abdomen: Appears non-distended. No obvious abdominal masses. Msk:  Strength and tone appear normal for age. No obvious joint deformities or effusions. Extremities: No clubbing or cyanosis. No edema.  Distal pedal pulses are 2+ bilaterally. Neuro: Alert and oriented X 3. Moves all extremities spontaneously. No focal deficits noted. Psych:  Responds to questions appropriately with a normal affect. Skin: No rashes or lesions noted  Wt Readings from Last 3 Encounters:  04/10/21 249 lb (112.9 kg)  03/15/21 220 lb (99.8 kg)  10/31/13 190 lb (86.2 kg)        Studies/Labs Reviewed:   EKG:  EKG is*** ordered today.  The ekg ordered today demonstrates ***  Recent Labs: 03/16/2021: ALT 19; B Natriuretic Peptide 1,291.0; Hemoglobin 15.0; Platelets 285; TSH 0.486 03/18/2021: Magnesium 2.1 03/19/2021: BUN 22; Creatinine, Ser 0.70; Potassium 4.2; Sodium 139   Lipid Panel No results found for: CHOL, TRIG, HDL, CHOLHDL, VLDL, LDLCALC, LDLDIRECT  Additional studies/ records that were reviewed today include:   Echocardiogram: 03/19/2021 IMPRESSIONS     1. Left ventricular ejection fraction, by estimation, is 35 to 40%. The  left ventricle has moderately decreased function. The left ventricle  demonstrates regional wall motion abnormalities (see scoring  diagram/findings for description). Left ventricular   diastolic parameters were normal.   2. Right ventricular systolic function is mildly reduced. The right  ventricular size is normal. Tricuspid regurgitation signal is inadequate  for assessing PA pressure.   3. The mitral valve is grossly normal. Trivial mitral valve  regurgitation.   4. The aortic valve was not well visualized. There is mild calcification  of the aortic valve. Aortic valve regurgitation is not visualized. Aortic  valve mean gradient measures 4.0 mmHg.   5. The inferior  vena cava is normal in size with greater than 50%  respiratory variability, suggesting right atrial pressure of 3 mmHg.   Comparison(s): No prior Echocardiogram.   Assessment:    No diagnosis found.   Plan:   In order of problems listed above:  ***    Shared Decision Making/Informed Consent:   {Are you ordering a CV Procedure (e.g. stress test, cath, DCCV, TEE, etc)?   Press F2        :[id number]}    Medication Adjustments/Labs and Tests Ordered: Current medicines are reviewed at length with the patient today.  Concerns regarding medicines are outlined above.  Medication changes, Labs and Tests ordered today are listed in the Patient Instructions below. There are no Patient Instructions on file for this visit.   Signed, Ellsworth Lennox, PA-C  06/10/2021 12:48 PM  [facility] [address] [place], [place] [address] Phone: [phone] Fax: [phone]

## 2021-06-11 ENCOUNTER — Encounter: Payer: Self-pay | Admitting: *Deleted

## 2021-06-11 ENCOUNTER — Ambulatory Visit: Payer: Self-pay | Admitting: Student

## 2021-07-01 ENCOUNTER — Institutional Professional Consult (permissible substitution): Payer: Self-pay | Admitting: Pulmonary Disease

## 2021-07-23 NOTE — Progress Notes (Signed)
Cardiology Office Note    Date:  07/25/2021   ID:  Holly Fuentes, DOB Mar 01, 1971, MRN [id number]  PCP:  Waldon Reining, MD  Cardiologist: Dina Rich, MD    Chief Complaint  Patient presents with   Follow-up    Routine Visit    History of Present Illness:    Holly Fuentes is a 50 y.o. female with past medical history of anxiety, depression, tobacco use and newly-diagnosed cardiomyopathy (EF 35-40% by echo in 03/2021) who presents to the office today for 73-month follow-up.   She was examined by myself in 03/2021 following a recent hospitalization for a COPD and CHF exacerbation. She reported overall doing well at the time of her follow-up visit and reported her dyspnea had improved. She was currently taking Lasix 20 mg daily, Losartan 12.5 mg daily and Toprol-XL 12.5 mg daily with this being titrated to 25 mg daily. Was planning to obtain a repeat BMET and switch to [facility] if approved for patient assistance.     In talking with the patient today, she reports overall feeling well since her last office visit. She does continue to have dyspnea on exertion which has overall been stable. Denies any associated chest pain or palpitations. No recent orthopnea or PND. She does experience intermittent lower extremity edema. Reports good compliance with her medications and she was recently approved for [place] [facility].   Past Medical History:  Diagnosis Date   Anxiety    CHF (congestive heart failure) (HCC)    a.EF 35-40% by echo in 03/2021   Depression    PTSD (post-traumatic stress disorder)     Past Surgical History:  Procedure Laterality Date   BACK SURGERY     CESAREAN SECTION      Current Medications: Outpatient Medications Prior to Visit  Medication Sig Dispense Refill   albuterol (VENTOLIN HFA) 108 (90 Base) MCG/ACT inhaler Inhale 2 puffs into the lungs every 6 (six) hours as needed for wheezing or shortness of breath. 8 g 2   budesonide-formoterol (SYMBICORT)  80-4.5 MCG/ACT inhaler Inhale 2 puffs into the lungs in the morning and at bedtime. 1 each 12   busPIRone (BUSPAR) 7.5 MG tablet Take 1 tablet (7.5 mg total) by mouth 2 (two) times daily. 60 tablet 0   furosemide (LASIX) 20 MG tablet Take 1 tablet (20 mg total) by mouth daily. 30 tablet 11   pantoprazole (PROTONIX) 40 MG tablet Take 1 tablet (40 mg total) by mouth daily. 30 tablet 0   losartan (COZAAR) 25 MG tablet Take 0.5 tablets (12.5 mg total) by mouth daily. 15 tablet 2   metoprolol succinate (TOPROL-XL) 25 MG 24 hr tablet Take 1 tablet (25 mg total) by mouth daily. 30 tablet 5   No facility-administered medications prior to visit.     Allergies:   Cranberry and Hydrocodone-acetaminophen   Social History   Socioeconomic History   Marital status: Single    Spouse name: Not on file   Number of children: Not on file   Years of education: Not on file   Highest education level: Not on file  Occupational History   Not on file  Tobacco Use   Smoking status: Former    Packs/day: 0.50    Types: Cigarettes    Quit date: 11/14/2020    Years since quitting: 0.6   Smokeless tobacco: Never  Vaping Use   Vaping Use: Never used  Substance and Sexual Activity   Alcohol use: No   Drug use:  No    Types: Other-see comments    Comment: Pills- states she will be clean x1 year in Feb 2013   Sexual activity: Yes    Birth control/protection: None, Abstinence  Other Topics Concern   Not on file  Social History Narrative   Not on file   Social Determinants of Health   Financial Resource Strain: Not on file  Food Insecurity: Not on file  Transportation Needs: Not on file  Physical Activity: Not on file  Stress: Not on file  Social Connections: Not on file     Family History:  The patient's family history includes Cancer in her maternal grandmother; Heart disease in her maternal grandfather and maternal grandmother; Hypertension in her mother.   Review of Systems:    Please see the  history of present illness.     All other systems reviewed and are otherwise negative except as noted above.   Physical Exam:    VS:  BP 132/84   Pulse (!) 52   Ht 5\' 3"  (1.6 m)   Wt 237 lb (107.5 kg)   LMP 01/18/2016 (Approximate)   SpO2 94%   BMI 41.98 kg/m    General: Well developed, well nourished,female appearing in no acute distress. Head: Normocephalic, atraumatic. Neck: No carotid bruits. JVD not elevated.  Lungs: Respirations regular and unlabored, without wheezes or rales.  Heart: Regular rate and rhythm. No S3 or S4.  No murmur, no rubs, or gallops appreciated. Abdomen: Appears non-distended. No obvious abdominal masses. Msk:  Strength and tone appear normal for age. No obvious joint deformities or effusions. Extremities: No clubbing or cyanosis. Trace edema.  Distal pedal pulses are 2+ bilaterally. Neuro: Alert and oriented X 3. Moves all extremities spontaneously. No focal deficits noted. Psych:  Responds to questions appropriately with a normal affect. Skin: No rashes or lesions noted  Wt Readings from Last 3 Encounters:  07/24/21 237 lb (107.5 kg)  04/10/21 249 lb (112.9 kg)  03/15/21 220 lb (99.8 kg)     Studies/Labs Reviewed:   EKG:  EKG is not ordered today.   Recent Labs: 03/16/2021: ALT 19; B Natriuretic Peptide 1,291.0; Hemoglobin 15.0; Platelets 285; TSH 0.486 03/18/2021: Magnesium 2.1 03/19/2021: BUN 22; Creatinine, Ser 0.70; Potassium 4.2; Sodium 139   Lipid Panel No results found for: CHOL, TRIG, HDL, CHOLHDL, VLDL, LDLCALC, LDLDIRECT  Additional studies/ records that were reviewed today include:   Echocardiogram: 03/2021 IMPRESSIONS     1. Left ventricular ejection fraction, by estimation, is 35 to 40%. The  left ventricle has moderately decreased function. The left ventricle  demonstrates regional wall motion abnormalities (see scoring  diagram/findings for description). Left ventricular   diastolic parameters were normal.   2. Right  ventricular systolic function is mildly reduced. The right  ventricular size is normal. Tricuspid regurgitation signal is inadequate  for assessing PA pressure.   3. The mitral valve is grossly normal. Trivial mitral valve  regurgitation.   4. The aortic valve was not well visualized. There is mild calcification  of the aortic valve. Aortic valve regurgitation is not visualized. Aortic  valve mean gradient measures 4.0 mmHg.   5. The inferior vena cava is normal in size with greater than 50%  respiratory variability, suggesting right atrial pressure of 3 mmHg.   Comparison(s): No prior Echocardiogram.   Assessment:    1. Chronic systolic heart failure (HCC)   2. Medication management   3. Essential hypertension   4. Chronic obstructive pulmonary disease, unspecified COPD  type [facility])      Plan:   In order of problems listed above:  1. HFrEF - Her EF was at 35-40% in 03/2021 and she has overall been doing well on her current regimen. Still with dyspnea on exertion but no orthopnea, PND or pitting edema.  - She is currently on Losartan 25mg  daily, Toprol-XL 25mg  BID and Lasix 20mg  daily. Will stop Losartan and switch to Entresto 24-26mg  BID as this is available through [facility]. Repeat BMET in 2 weeks. I have asked her to follow BP at home and report back on readings. Next step would be adding an SGLT2 inhibitor. Would plan for a repeat echocardiogram early next year once GDMT has been optimized. If EF remains reduced, will need to arrange for ischemic evaluation.   2. HTN - Her BP is at 132/84 during today's visit and has been well-controlled when checked at home. I did ask her to continue to follow readings at home given the switch to [facility].   3. Abnormal Chest CT/COPD - Prior Chest CT showed ill-defined ground-glass nodules which may represent infection but follow-up imaging was recommended in 4-6 weeks to exclude other etiologies such as malignancy. She had follow-up with  Pulmonology in 04/2021 but canceled the visit. If not obtained in the interim, would order at her next visit. She does have an upcoming visit with Pulmonology later this month.    Medication Adjustments/Labs and Tests Ordered: Current medicines are reviewed at length with the patient today.  Concerns regarding medicines are outlined above.  Medication changes, Labs and Tests ordered today are listed in the Patient Instructions below. Patient Instructions  Medication Instructions:  STOP Losartan   START Entresto 24/26 mg twice a day   Labwork: Bmet in 2-3 weeks  Testing/Procedures: None   Follow-Up: None   Any Other Special Instructions Will Be Listed Below (If Applicable).  If you need a refill on your cardiac medications before your next appointment, please call your pharmacy.   Signed, [facility], PA-C  07/25/2021 8:55 AM    [facility] [address] [place], 14/05/2021 [address] Phone: [phone] Fax: [phone]

## 2021-07-24 ENCOUNTER — Ambulatory Visit (INDEPENDENT_AMBULATORY_CARE_PROVIDER_SITE_OTHER): Payer: Self-pay | Admitting: Student

## 2021-07-24 ENCOUNTER — Encounter: Payer: Self-pay | Admitting: Student

## 2021-07-24 ENCOUNTER — Other Ambulatory Visit: Payer: Self-pay

## 2021-07-24 VITALS — BP 132/84 | HR 52 | Ht 63.0 in | Wt 237.0 lb

## 2021-07-24 DIAGNOSIS — I10 Essential (primary) hypertension: Secondary | ICD-10-CM

## 2021-07-24 DIAGNOSIS — Z79899 Other long term (current) drug therapy: Secondary | ICD-10-CM

## 2021-07-24 DIAGNOSIS — I5022 Chronic systolic (congestive) heart failure: Secondary | ICD-10-CM

## 2021-07-24 DIAGNOSIS — J449 Chronic obstructive pulmonary disease, unspecified: Secondary | ICD-10-CM

## 2021-07-24 MED ORDER — ENTRESTO 24-26 MG PO TABS: 1.0000 | ORAL_TABLET | Freq: Two times a day (BID) | ORAL | 3 refills | Status: DC

## 2021-07-24 MED ORDER — LOSARTAN POTASSIUM 25 MG PO TABS: 25.0000 mg | ORAL_TABLET | Freq: Every day | ORAL | 2 refills | Status: DC

## 2021-07-24 MED ORDER — METOPROLOL SUCCINATE ER 25 MG PO TB24: 25.0000 mg | ORAL_TABLET | Freq: Two times a day (BID) | ORAL | 5 refills | Status: DC

## 2021-07-24 NOTE — Patient Instructions (Signed)
Medication Instructions:  STOP Losartan   START Entresto 24/26 mg twice a day   Labwork: Bmet in 2-3 weeks  Testing/Procedures: None   Follow-Up: None   Any Other Special Instructions Will Be Listed Below (If Applicable).  If you need a refill on your cardiac medications before your next appointment, please call your pharmacy.

## 2021-08-05 ENCOUNTER — Telehealth: Payer: Self-pay | Admitting: Student

## 2021-08-05 MED ORDER — ENTRESTO 24-26 MG PO TABS: 1.0000 | ORAL_TABLET | Freq: Two times a day (BID) | ORAL | 3 refills | Status: DC

## 2021-08-05 NOTE — Telephone Encounter (Signed)
Returned call to pt. No answer. Left msg to call back.  

## 2021-08-05 NOTE — Telephone Encounter (Signed)
Patient is calling about sacubitril-valsartan (ENTRESTO) 24-26 MG, she states that patient assistance forms were filled out for this medication, but she still hasn't received the medication.

## 2021-08-06 MED ORDER — ENTRESTO 24-26 MG PO TABS: 1.0000 | ORAL_TABLET | Freq: Two times a day (BID) | ORAL | 3 refills | Status: DC

## 2021-08-06 NOTE — Telephone Encounter (Signed)
Pt request that Entesto be sent to [employer].

## 2021-08-06 NOTE — Addendum Note (Signed)
Addended by: Kerney Elbe on: 08/06/2021 12:26 PM   Modules accepted: Orders

## 2021-08-14 ENCOUNTER — Institutional Professional Consult (permissible substitution): Payer: Self-pay | Admitting: Internal Medicine

## 2021-08-14 NOTE — Progress Notes (Incomplete)
° °  Holly Fuentes, female    DOB: Aug 19, 1970,  MRN: [id number]   Brief patient profile:   ***  yo*** *** with chf and ? Copd referred to pulmonary clinic in [place]  08/14/2021 by *** for ***      History of Present Illness  08/14/2021  Pulmonary/ 1st office eval/ Holly Fuentes / [name]  No chief complaint on file.    Dyspnea:  *** Cough: *** Sleep: *** SABA use:   Past Medical History:  Diagnosis Date   Anxiety    CHF (congestive heart failure) (HCC)    a.EF 35-40% by echo in 03/2021   Depression    PTSD (post-traumatic stress disorder)     Outpatient Medications Prior to Visit  Medication Sig Dispense Refill   albuterol (VENTOLIN HFA) 108 (90 Base) MCG/ACT inhaler Inhale 2 puffs into the lungs every 6 (six) hours as needed for wheezing or shortness of breath. 8 g 2   budesonide-formoterol (SYMBICORT) 80-4.5 MCG/ACT inhaler Inhale 2 puffs into the lungs in the morning and at bedtime. 1 each 12   busPIRone (BUSPAR) 7.5 MG tablet Take 1 tablet (7.5 mg total) by mouth 2 (two) times daily. 60 tablet 0   furosemide (LASIX) 20 MG tablet Take 1 tablet (20 mg total) by mouth daily. 30 tablet 11   metoprolol succinate (TOPROL-XL) 25 MG 24 hr tablet Take 1 tablet (25 mg total) by mouth 2 (two) times daily. 30 tablet 5   pantoprazole (PROTONIX) 40 MG tablet Take 1 tablet (40 mg total) by mouth daily. 30 tablet 0   sacubitril-valsartan (ENTRESTO) 24-26 MG Take 1 tablet by mouth 2 (two) times daily. 180 tablet 3   No facility-administered medications prior to visit.     Objective:     LMP 01/18/2016 (Approximate)          Assessment   No problem-specific Assessment & Plan notes found for this encounter.     Holly Hughs, MD 08/14/2021

## 2021-09-17 ENCOUNTER — Institutional Professional Consult (permissible substitution): Payer: Self-pay | Admitting: Pulmonary Disease

## 2021-09-24 ENCOUNTER — Other Ambulatory Visit: Payer: Self-pay

## 2021-09-24 ENCOUNTER — Other Ambulatory Visit: Payer: Self-pay | Admitting: *Deleted

## 2021-09-24 ENCOUNTER — Ambulatory Visit (INDEPENDENT_AMBULATORY_CARE_PROVIDER_SITE_OTHER): Payer: Self-pay | Admitting: Student

## 2021-09-24 ENCOUNTER — Encounter: Payer: Self-pay | Admitting: Student

## 2021-09-24 VITALS — BP 118/80 | HR 80 | Ht 63.0 in | Wt 229.4 lb

## 2021-09-24 DIAGNOSIS — R9389 Abnormal findings on diagnostic imaging of other specified body structures: Secondary | ICD-10-CM

## 2021-09-24 DIAGNOSIS — I10 Essential (primary) hypertension: Secondary | ICD-10-CM

## 2021-09-24 DIAGNOSIS — I5022 Chronic systolic (congestive) heart failure: Secondary | ICD-10-CM

## 2021-09-24 MED ORDER — ENTRESTO 24-26 MG PO TABS: 1.0000 | ORAL_TABLET | Freq: Two times a day (BID) | ORAL | 3 refills | Status: DC

## 2021-09-24 NOTE — Progress Notes (Signed)
Cardiology Office Note    Date:  09/24/2021   ID:  ANEA WISCH, DOB 03/15/1971, MRN [id number]  PCP:  [name], MD  Cardiologist: Carlyle Dolly, MD    Chief Complaint  Patient presents with   Follow-up    2 month visit    History of Present Illness:    Holly Fuentes is a 51 y.o. female  with past medical history of anxiety, depression, tobacco use and newly-diagnosed cardiomyopathy (EF 35-40% by echo in 03/2021) who presents to the office today for 30-month follow-up.  She was last examined by myself in 07/2021 and was still having some dyspnea on exertion but symptoms had overall improved. Had recently been approved for [place] [facility], therefore Losartan was discontinued and she was transitioned to Entresto 24-26 mg twice daily along with continuing on Toprol-XL.  In talking with the patient today, she reports overall feeling well since her last office visit. Her breathing has been stable and she denies any orthopnea, PND or pitting edema. No recent chest pain or palpitations. She was unable to start [facility] as this was not covered by [place] [facility]. She has remained on Lasix 20 mg daily, Losartan 25 mg daily and Toprol-XL 25 mg twice daily.  Past Medical History:  Diagnosis Date   Anxiety    CHF (congestive heart failure) (HCC)    a.EF 35-40% by echo in 03/2021   Depression    PTSD (post-traumatic stress disorder)     Past Surgical History:  Procedure Laterality Date   BACK SURGERY     CESAREAN SECTION      Current Medications: Outpatient Medications Prior to Visit  Medication Sig Dispense Refill   albuterol (VENTOLIN HFA) 108 (90 Base) MCG/ACT inhaler Inhale 2 puffs into the lungs every 6 (six) hours as needed for wheezing or shortness of breath. 8 g 2   atorvastatin (LIPITOR) 20 MG tablet Take 20 mg by mouth daily.     furosemide (LASIX) 20 MG tablet Take 1 tablet (20 mg total) by mouth daily. 30 tablet 11   metoprolol succinate (TOPROL-XL) 25 MG 24 hr  tablet Take 1 tablet (25 mg total) by mouth 2 (two) times daily. 30 tablet 5   pantoprazole (PROTONIX) 40 MG tablet Take 1 tablet (40 mg total) by mouth daily. 30 tablet 0   losartan (COZAAR) 25 MG tablet Take 25 mg by mouth daily.     budesonide-formoterol (SYMBICORT) 80-4.5 MCG/ACT inhaler Inhale 2 puffs into the lungs in the morning and at bedtime. (Patient not taking: Reported on 09/24/2021) 1 each 12   busPIRone (BUSPAR) 7.5 MG tablet Take 1 tablet (7.5 mg total) by mouth 2 (two) times daily. (Patient not taking: Reported on 09/24/2021) 60 tablet 0   sacubitril-valsartan (ENTRESTO) 24-26 MG Take 1 tablet by mouth 2 (two) times daily. (Patient not taking: Reported on 09/24/2021) 180 tablet 3   No facility-administered medications prior to visit.     Allergies:   Cranberry and Hydrocodone-acetaminophen   Social History   Socioeconomic History   Marital status: Single    Spouse name: Not on file   Number of children: Not on file   Years of education: Not on file   Highest education level: Not on file  Occupational History   Not on file  Tobacco Use   Smoking status: Former    Packs/day: 0.50    Types: Cigarettes    Quit date: 11/14/2020    Years since quitting: 0.8   Smokeless tobacco:  Never  Vaping Use   Vaping Use: Never used  Substance and Sexual Activity   Alcohol use: No   Drug use: No    Types: Other-see comments    Comment: Pills- states she will be clean x1 year in Feb 2013   Sexual activity: Yes    Birth control/protection: None, Abstinence  Other Topics Concern   Not on file  Social History Narrative   Not on file   Social Determinants of Health   Financial Resource Strain: Not on file  Food Insecurity: Not on file  Transportation Needs: Not on file  Physical Activity: Not on file  Stress: Not on file  Social Connections: Not on file     Family History:  The patient's family history includes Cancer in her maternal grandmother; Heart disease in her maternal  grandfather and maternal grandmother; Hypertension in her mother.   Review of Systems:    Please see the history of present illness.     All other systems reviewed and are otherwise negative except as noted above.   Physical Exam:    VS:  BP 118/80    Pulse 80    Ht 5\' 3"  (1.6 m)    Wt 229 lb 6.4 oz (104.1 kg)    LMP 01/18/2016 (Approximate)    SpO2 96%    BMI 40.64 kg/m    General: Well developed, well nourished,female appearing in no acute distress. Head: Normocephalic, atraumatic. Neck: No carotid bruits. JVD not elevated.  Lungs: Respirations regular and unlabored, without wheezes or rales.  Heart: Regular rate and rhythm. No S3 or S4.  No murmur, no rubs, or gallops appreciated. Abdomen: Appears non-distended. No obvious abdominal masses. Msk:  Strength and tone appear normal for age. No obvious joint deformities or effusions. Extremities: No clubbing or cyanosis. No pitting edema.  Distal pedal pulses are 2+ bilaterally. Neuro: Alert and oriented X 3. Moves all extremities spontaneously. No focal deficits noted. Psych:  Responds to questions appropriately with a normal affect. Skin: No rashes or lesions noted  Wt Readings from Last 3 Encounters:  09/24/21 229 lb 6.4 oz (104.1 kg)  07/24/21 237 lb (107.5 kg)  04/10/21 249 lb (112.9 kg)    Studies/Labs Reviewed:   EKG:  EKG is not ordered today.   Recent Labs: 03/16/2021: ALT 19; B Natriuretic Peptide 1,291.0; Hemoglobin 15.0; Platelets 285; TSH 0.486 03/18/2021: Magnesium 2.1 03/19/2021: BUN 22; Creatinine, Ser 0.70; Potassium 4.2; Sodium 139   Lipid Panel No results found for: CHOL, TRIG, HDL, CHOLHDL, VLDL, LDLCALC, LDLDIRECT  Additional studies/ records that were reviewed today include:   Echocardiogram: 03/2021 IMPRESSIONS     1. Left ventricular ejection fraction, by estimation, is 35 to 40%. The  left ventricle has moderately decreased function. The left ventricle  demonstrates regional wall motion  abnormalities (see scoring  diagram/findings for description). Left ventricular   diastolic parameters were normal.   2. Right ventricular systolic function is mildly reduced. The right  ventricular size is normal. Tricuspid regurgitation signal is inadequate  for assessing PA pressure.   3. The mitral valve is grossly normal. Trivial mitral valve  regurgitation.   4. The aortic valve was not well visualized. There is mild calcification  of the aortic valve. Aortic valve regurgitation is not visualized. Aortic  valve mean gradient measures 4.0 mmHg.   5. The inferior vena cava is normal in size with greater than 50%  respiratory variability, suggesting right atrial pressure of 3 mmHg.   Comparison(s): No  prior Echocardiogram.  Assessment:    1. Chronic systolic heart failure ([place])   2. Essential hypertension   3. Abnormal chest CT      Plan:   In order of problems listed above:  1. HFrEF - Echocardiogram in 03/2021 showed her EF was reduced at 35 to 40%.  She appears euvolemic by examination today. She has remained on Lasix 20 mg daily, Toprol-XL 25 mg twice daily and Losartan 25 mg daily. She did have issues obtaining Entresto through [employer] as Delene Loll is only provided for [facility] residents by review of their website. Therefore, we provided her with a 30-day trial card for [facility] today and will work on applying for patient assistance directly through [employer] and she is aware to stop Losartan once she starts [employer]. Repeat BMET in 2 weeks (scheduled for labs with her PCP on 10/05/2021).  - Pending follow-up of her BP and renal function, could consider adding Spironolactone or an SGLT2 inhibitor (would need patient assistance as well since not covered by [place] [facility]). Would ultimately plan for a repeat echocardiogram once medical therapy has been optimized.  2. HTN - Her BP is at 118/80 during today's visit. Continue Toprol-XL and Lasix at current dosing but  will stop Losartan and switch to [facility] as outlined above.  3. Abnormal Chest CT - Chest CT in 03/2021 showed numerous ill-defined groundglass nodules throughout the upper and lower lobes with repeat Chest CT recommended in 4 to 6 weeks per Pulmonology. She is scheduled to see Dr. Elsworth Soho in 10/2021 for follow-up of this.   Medication Adjustments/Labs and Tests Ordered: Current medicines are reviewed at length with the patient today.  Concerns regarding medicines are outlined above.  Medication changes, Labs and Tests ordered today are listed in the Patient Instructions below. Patient Instructions  Medication Instructions:  Your physician has recommended you make the following change in your medication:  Stop losartan Start entresto 24/26 mg twice daily-take voucher to [facility] for 30 days free Continue other medications the same  Labwork: none  Testing/Procedures: none  Follow-Up: Your physician recommends that you schedule a follow-up appointment in: 6-8 weeks  Any Other Special Instructions Will Be Listed Below (If Applicable). We will send in your [employer] patient assistance application for entresto  If you need a refill on your cardiac medications before your next appointment, please call your pharmacy.   Signed, Holly Heritage, PA-C  09/24/2021 3:01 PM    [place] [address] [place],  [address] Phone: [phone] Fax: [phone]

## 2021-09-24 NOTE — Patient Instructions (Addendum)
Medication Instructions:  Your physician has recommended you make the following change in your medication:  Stop losartan Start entresto 24/26 mg twice daily-take voucher to [facility] for 30 days free Continue other medications the same  Labwork: none  Testing/Procedures: none  Follow-Up: Your physician recommends that you schedule a follow-up appointment in: 6-8 weeks  Any Other Special Instructions Will Be Listed Below (If Applicable). We will send in your [employer] patient assistance application for entresto  If you need a refill on your cardiac medications before your next appointment, please call your pharmacy.

## 2021-10-22 ENCOUNTER — Telehealth: Payer: Self-pay | Admitting: Student

## 2021-10-22 NOTE — Telephone Encounter (Signed)
Pt notified that she needs to call [employer] at [phone] to inform them that she has no income.  ?

## 2021-10-22 NOTE — Telephone Encounter (Signed)
Patient returned call, transferred to LPN. ?

## 2021-10-22 NOTE — Telephone Encounter (Signed)
? ?  Please let the patient know I reviewed her blood pressure log and would not further titrate Entresto at this time given her soft BP in the evening hours. Are there any updates on if she was approved for patient assistance for Entresto? Would like to keep her on this along with Toprol-XL and possibly add Spironolactone or an SGLT-2 inhibitor pending follow-up labs. She does need to have a repeat BMET if not obtained by her PCP since starting Entresto. If obtained by her PCP, please request a copy of records.  ? ?Thanks,  ?Erma Heritage, PA-C ?10/22/2021, 11:04 AM ? ? ?

## 2021-10-22 NOTE — Telephone Encounter (Signed)
Spoke with [employer] pt assistance. Pt needs to show proof of income. Pt states that she has no income. Called to inform pt that she must call and that HCP can not call, no answer left msg to call back.  ?

## 2021-10-27 ENCOUNTER — Institutional Professional Consult (permissible substitution): Payer: Self-pay | Admitting: Pulmonary Disease

## 2021-10-29 ENCOUNTER — Telehealth: Payer: Self-pay | Admitting: Student

## 2021-10-29 NOTE — Telephone Encounter (Signed)
Entresto 24-26 mg tablets: ?Lot #: [id number] ?Exp: 02/25 ? ?Pt notified samples were available at the [place] office. ?

## 2021-10-29 NOTE — Telephone Encounter (Signed)
Patient calling the office for samples of medication: ? ? ?1.  What medication and dosage are you requesting samples for? Entresto ? ?2.  Are you currently out of this medication?  Yes- waiting to hear from Patient Assistance Program ? ? ?

## 2021-10-30 ENCOUNTER — Telehealth: Payer: Self-pay | Admitting: *Deleted

## 2021-10-30 NOTE — Telephone Encounter (Signed)
Pt notified that she has been approved for the [employer] patient assistance Holly Fuentes).  ?

## 2021-11-19 ENCOUNTER — Encounter: Payer: Self-pay | Admitting: Physician Assistant

## 2021-11-19 NOTE — Progress Notes (Deleted)
? ?Cardiology Office Note   ? ?Date:  11/19/2021  ? ?ID:  Holly Fuentes, DOB [birth year], MRN [id number] ? ?PCP:  [name], MD  ?Cardiologist:  Carlyle Dolly, MD  ?Electrophysiologist:  None  ? ?Chief Complaint: *** ? ?History of Present Illness:  ? ?Holly Fuentes is a 51 y.o. female with history of chronic HFrEF, , PTSD, HLD, abnormal chest CT (ground glass nodules), tobacco abuse, pre-DM who presents for follow-up. 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 03/19/21 showed EF [id number], normal diastolic parameters, mildly reduced RVF. CT of the chest showed "Numerous ill-defined ground-glass nodules throughout upper and lower lobes. Septal thickening is mildly irregular. Findings may represent sequela of atypical infection. This is also seen on a background of mildly irregular septal thickening. Differential considerations would include metastatic disease to the chest and respiratory bronchiolitis. Pulmonary consultation may be helpful, close follow-up is suggested. Scattered mildly prominent lymph nodes in the chest may be reactive, attention on follow-up. Nodule adjacent the LEFT adrenal favored to represent a small splenule. Consider follow-up dedicated abdominal imaging. Main pulmonary artery 3.8 cm, can be seen in the setting of pulmonary arterial hypertension. Aortic atherosclerosis." Outpatient pulm f/u was recommended though I do not see this occurred. She was started on GDMT. She had issues obtaining Entresto assistance. She has not had prior ischemic evaluation. ? ? ?Chronic HFrEF, etiology of cardiomyopathy not yet determined ?Hyperlipidemia ?Abnormal chest CT ?Tobacco abuse ? ?Labwork independently reviewed: ?04/2021 LDL 129, TSH wnl, Cr 0.80, K 4.6, LFTs ok, Hgb 15.2, plt 332, A1C 6.2, BNP 63 ?03/2021 TSH wnl ? ? ?Cardiology Studies:  ? ?Studies reviewed are outlined and summarized above. Reports included below if pertinent.  ? ?2D echo 03/2021 ? 1.  Left ventricular ejection fraction, by estimation, is 35 to 40%. The  ?left ventricle has moderately decreased function. The left ventricle  ?demonstrates regional wall motion abnormalities (see scoring  ?diagram/findings for description). Left ventricular  ? diastolic parameters were normal.  ? 2. Right ventricular systolic function is mildly reduced. The right  ?ventricular size is normal. Tricuspid regurgitation signal is inadequate  ?for assessing PA pressure.  ? 3. The mitral valve is grossly normal. Trivial mitral valve  ?regurgitation.  ? 4. The aortic valve was not well visualized. There is mild calcification  ?of the aortic valve. Aortic valve regurgitation is not visualized. Aortic  ?valve mean gradient measures 4.0 mmHg.  ? 5. The inferior vena cava is normal in size with greater than 50%  ?respiratory variability, suggesting right atrial pressure of 3 mmHg.  ? ?Comparison(s): No prior Echocardiogram  ? ? ?Past Medical History:  ?Diagnosis Date  ? Anxiety   ? CHF (congestive heart failure) ([place])   ? a.EF 35-40% by echo in 03/2021  ? Depression   ? PTSD (post-traumatic stress disorder)   ? ? ?Past Surgical History:  ?Procedure Laterality Date  ? BACK SURGERY    ? CESAREAN SECTION    ? ? ?Current Medications: ?No outpatient medications have been marked as taking for the 11/24/21 encounter (Appointment) with Charlie Pitter, PA-C.  ? ?***  ? ?Allergies:   Cranberry and Hydrocodone-acetaminophen  ? ?Social History  ? ?Socioeconomic History  ? Marital status: Single  ?  Spouse name: Not on file  ? Number of children: Not on file  ? Years of education: Not on file  ? Highest education level: Not on file  ?Occupational History  ? Not  on file  ?Tobacco Use  ? Smoking status: Former  ?  Packs/day: 0.50  ?  Types: Cigarettes  ?  Quit date: 11/14/2020  ?  Years since quitting: 1.0  ? Smokeless tobacco: Never  ?Vaping Use  ? Vaping Use: Never used  ?Substance and Sexual Activity  ? Alcohol use: No  ? Drug use: No  ?   Types: Other-see comments  ?  Comment: Pills- states she will be clean x1 year in Feb 2013  ? Sexual activity: Yes  ?  Birth control/protection: None, Abstinence  ?Other Topics Concern  ? Not on file  ?Social History Narrative  ? Not on file  ? ?Social Determinants of Health  ? ?Financial Resource Strain: Not on file  ?Food Insecurity: Not on file  ?Transportation Needs: Not on file  ?Physical Activity: Not on file  ?Stress: Not on file  ?Social Connections: Not on file  ?  ? ?Family History:  ?The patient's ***family history includes Cancer in her maternal grandmother; Heart disease in her maternal grandfather and maternal grandmother; Hypertension in her mother. ? ?ROS:   ?Please see the history of present illness. Otherwise, review of systems is positive for ***.  ?All other systems are reviewed and otherwise negative.  ? ? ?EKG(s)/Additional Labs  ? ?EKG:  EKG is ordered today, personally reviewed, demonstrating *** ? ?Recent Labs: ?03/16/2021: ALT 19; B Natriuretic Peptide 1,291.0; Hemoglobin 15.0; Platelets 285; TSH 0.486 ?03/18/2021: Magnesium 2.1 ?03/19/2021: BUN 22; Creatinine, Ser 0.70; Potassium 4.2; Sodium 139  ?Recent Lipid Panel ?No results found for: CHOL, TRIG, HDL, CHOLHDL, VLDL, LDLCALC, LDLDIRECT ? ?PHYSICAL EXAM:   ? ?VS:  LMP 01/18/2016 (Approximate)   BMI: There is no height or weight on file to calculate BMI. ? ?GEN: Well nourished, well developed female in no acute distress ?HEENT: normocephalic, atraumatic ?Neck: no JVD, carotid bruits, or masses ?Cardiac: ***RRR; no murmurs, rubs, or gallops, no edema  ?Respiratory:  clear to auscultation bilaterally, normal work of breathing ?GI: soft, nontender, nondistended, + BS ?MS: no deformity or atrophy ?Skin: warm and dry, no rash ?Neuro:  Alert and Oriented x 3, Strength and sensation are intact, follows commands ?Psych: euthymic mood, full affect ? ?Wt Readings from Last 3 Encounters:  ?09/24/21 229 lb 6.4 oz (104.1 kg)  ?07/24/21 237 lb (107.5 kg)   ?04/10/21 249 lb (112.9 kg)  ?  ? ?ASSESSMENT & PLAN:  ? ?*** ? ?  ? ?Disposition: F/u with *** ? ? ?Medication Adjustments/Labs and Tests Ordered: ?Current medicines are reviewed at length with the patient today.  Concerns regarding medicines are outlined above. Medication changes, Labs and Tests ordered today are summarized above and listed in the Patient Instructions accessible in Encounters.  ? ? ?Signed, ?Charlie Pitter, PA-C  ?11/19/2021 3:53 PM    ?[facility] [facility] ?[address] ?[place], [place] [address] ?Ph: [phone]; Fax [phone] ?

## 2021-11-24 ENCOUNTER — Ambulatory Visit: Payer: Self-pay | Admitting: Physician Assistant

## 2021-11-24 DIAGNOSIS — I5020 Unspecified systolic (congestive) heart failure: Secondary | ICD-10-CM

## 2021-11-24 DIAGNOSIS — E785 Hyperlipidemia, unspecified: Secondary | ICD-10-CM

## 2021-11-24 DIAGNOSIS — Z720 Tobacco use: Secondary | ICD-10-CM

## 2021-11-24 DIAGNOSIS — R9389 Abnormal findings on diagnostic imaging of other specified body structures: Secondary | ICD-10-CM

## 2021-11-25 ENCOUNTER — Encounter: Payer: Self-pay | Admitting: Physician Assistant

## 2021-12-04 ENCOUNTER — Institutional Professional Consult (permissible substitution): Payer: Self-pay | Admitting: Internal Medicine

## 2021-12-04 NOTE — Progress Notes (Deleted)
   Holly Fuentes, female    DOB: 15-Sep-1970    MRN: [id number]   Brief patient profile:  ***  yo*** *** referred to pulmonary clinic in [place]  12/04/2021 by *** for ***      History of Present Illness  12/04/2021  Pulmonary/ 1st office eval/ Melvyn Novas / [place] [facility]  No chief complaint on file.    Dyspnea:  *** Cough: *** Sleep: *** SABA use:   Past Medical History:  Diagnosis Date   Abnormal chest CT    Anxiety    Depression    HFrEF (heart failure with reduced ejection fraction) (HCC)    a.EF 35-40% by echo in 03/2021   HLD (hyperlipidemia)    PTSD (post-traumatic stress disorder)    Tobacco abuse     Outpatient Medications Prior to Visit  Medication Sig Dispense Refill   albuterol (VENTOLIN HFA) 108 (90 Base) MCG/ACT inhaler Inhale 2 puffs into the lungs every 6 (six) hours as needed for wheezing or shortness of breath. 8 g 2   atorvastatin (LIPITOR) 20 MG tablet Take 20 mg by mouth daily.     budesonide-formoterol (SYMBICORT) 80-4.5 MCG/ACT inhaler Inhale 2 puffs into the lungs in the morning and at bedtime. (Patient not taking: Reported on 09/24/2021) 1 each 12   furosemide (LASIX) 20 MG tablet Take 1 tablet (20 mg total) by mouth daily. 30 tablet 11   metoprolol succinate (TOPROL-XL) 25 MG 24 hr tablet Take 1 tablet (25 mg total) by mouth 2 (two) times daily. 30 tablet 5   pantoprazole (PROTONIX) 40 MG tablet Take 1 tablet (40 mg total) by mouth daily. 30 tablet 0   sacubitril-valsartan (ENTRESTO) 24-26 MG Take 1 tablet by mouth 2 (two) times daily. 180 tablet 3   No facility-administered medications prior to visit.     Objective:     LMP 01/18/2016 (Approximate)          Assessment   No problem-specific Assessment & Plan notes found for this encounter.     Christinia Gully, MD 12/04/2021

## 2022-01-01 ENCOUNTER — Telehealth: Payer: Self-pay | Admitting: Student

## 2022-01-01 NOTE — Telephone Encounter (Signed)
   Pt said, last night she woke up with a massive ear ache. She said her pcp is close today and would like to request if [place] can prescribed her an antibiotics

## 2022-01-01 NOTE — Telephone Encounter (Signed)
Per B.Strader, PA-C, she cannot write a script of antibiotics without actually seeing the patient. She suggested that she got to Urgent care in [place] or [place].Patient agrees.

## 2022-01-02 ENCOUNTER — Inpatient Hospital Stay (HOSPITAL_COMMUNITY)
Admission: EM | Admit: 2022-01-02 | Discharge: 2022-01-04 | DRG: 190 | Discharge status: Against Medical Advice | Payer: Self-pay | Attending: Family Medicine | Admitting: Family Medicine

## 2022-01-02 ENCOUNTER — Emergency Department (HOSPITAL_COMMUNITY): Payer: Medicaid Other

## 2022-01-02 ENCOUNTER — Observation Stay (HOSPITAL_COMMUNITY): Payer: Medicaid Other

## 2022-01-02 ENCOUNTER — Encounter (HOSPITAL_COMMUNITY): Payer: Self-pay

## 2022-01-02 ENCOUNTER — Other Ambulatory Visit: Payer: Self-pay

## 2022-01-02 DIAGNOSIS — E785 Hyperlipidemia, unspecified: Secondary | ICD-10-CM | POA: Diagnosis present

## 2022-01-02 DIAGNOSIS — Z91018 Allergy to other foods: Secondary | ICD-10-CM

## 2022-01-02 DIAGNOSIS — I110 Hypertensive heart disease with heart failure: Secondary | ICD-10-CM | POA: Diagnosis present

## 2022-01-02 DIAGNOSIS — F32A Depression, unspecified: Secondary | ICD-10-CM | POA: Diagnosis present

## 2022-01-02 DIAGNOSIS — F4310 Post-traumatic stress disorder, unspecified: Secondary | ICD-10-CM | POA: Diagnosis present

## 2022-01-02 DIAGNOSIS — I429 Cardiomyopathy, unspecified: Secondary | ICD-10-CM | POA: Diagnosis present

## 2022-01-02 DIAGNOSIS — D72829 Elevated white blood cell count, unspecified: Secondary | ICD-10-CM | POA: Diagnosis present

## 2022-01-02 DIAGNOSIS — R590 Localized enlarged lymph nodes: Secondary | ICD-10-CM | POA: Diagnosis present

## 2022-01-02 DIAGNOSIS — Z79899 Other long term (current) drug therapy: Secondary | ICD-10-CM

## 2022-01-02 DIAGNOSIS — E669 Obesity, unspecified: Secondary | ICD-10-CM | POA: Diagnosis present

## 2022-01-02 DIAGNOSIS — J9601 Acute respiratory failure with hypoxia: Secondary | ICD-10-CM | POA: Diagnosis present

## 2022-01-02 DIAGNOSIS — Z5329 Procedure and treatment not carried out because of patient's decision for other reasons: Secondary | ICD-10-CM | POA: Diagnosis present

## 2022-01-02 DIAGNOSIS — Z20822 Contact with and (suspected) exposure to covid-19: Secondary | ICD-10-CM | POA: Diagnosis present

## 2022-01-02 DIAGNOSIS — F419 Anxiety disorder, unspecified: Secondary | ICD-10-CM | POA: Diagnosis present

## 2022-01-02 DIAGNOSIS — Z87891 Personal history of nicotine dependence: Secondary | ICD-10-CM

## 2022-01-02 DIAGNOSIS — Z6841 Body Mass Index (BMI) 40.0 and over, adult: Secondary | ICD-10-CM

## 2022-01-02 DIAGNOSIS — R739 Hyperglycemia, unspecified: Secondary | ICD-10-CM | POA: Diagnosis present

## 2022-01-02 DIAGNOSIS — J441 Chronic obstructive pulmonary disease with (acute) exacerbation: Principal | ICD-10-CM

## 2022-01-02 DIAGNOSIS — Z8249 Family history of ischemic heart disease and other diseases of the circulatory system: Secondary | ICD-10-CM

## 2022-01-02 DIAGNOSIS — I5022 Chronic systolic (congestive) heart failure: Secondary | ICD-10-CM | POA: Diagnosis present

## 2022-01-02 DIAGNOSIS — Z885 Allergy status to narcotic agent status: Secondary | ICD-10-CM

## 2022-01-02 HISTORY — DX: Chronic obstructive pulmonary disease, unspecified: J44.9

## 2022-01-02 LAB — CBC
HCT: 41.6 % (ref 36.0–46.0)
Hemoglobin: 13.7 g/dL (ref 12.0–15.0)
MCH: 30.2 pg (ref 26.0–34.0)
MCHC: 32.9 g/dL (ref 30.0–36.0)
MCV: 91.6 fL (ref 80.0–100.0)
Platelets: 315 10*3/uL (ref 150–400)
RBC: 4.54 MIL/uL (ref 3.87–5.11)
RDW: 13.2 % (ref 11.5–15.5)
WBC: 19.5 10*3/uL — ABNORMAL HIGH (ref 4.0–10.5)
nRBC: 0 % (ref 0.0–0.2)

## 2022-01-02 LAB — BASIC METABOLIC PANEL
Anion gap: 10 (ref 5–15)
BUN: 17 mg/dL (ref 6–20)
CO2: 28 mmol/L (ref 22–32)
Calcium: 8.6 mg/dL — ABNORMAL LOW (ref 8.9–10.3)
Chloride: 101 mmol/L (ref 98–111)
Creatinine, Ser: 0.68 mg/dL (ref 0.44–1.00)
GFR, Estimated: >60 mL/min (ref >60)
Glucose, Bld: 148 mg/dL — ABNORMAL HIGH (ref 70–99)
Potassium: 3.7 mmol/L (ref 3.5–5.1)
Sodium: 139 mmol/L (ref 135–145)

## 2022-01-02 LAB — BRAIN NATRIURETIC PEPTIDE: B Natriuretic Peptide: 233 pg/mL — ABNORMAL HIGH (ref 0.0–100.0)

## 2022-01-02 LAB — HEMOGLOBIN A1C
Hgb A1c MFr Bld: 6 % — ABNORMAL HIGH (ref 4.8–5.6)
Mean Plasma Glucose: 125.5 mg/dL

## 2022-01-02 LAB — RESP PANEL BY RT-PCR (FLU A&B, COVID) ARPGX2
Influenza A by PCR: NEGATIVE
Influenza B by PCR: NEGATIVE
SARS Coronavirus 2 by RT PCR: NEGATIVE

## 2022-01-02 LAB — PROCALCITONIN: Procalcitonin: 0.31 ng/mL

## 2022-01-02 LAB — GLUCOSE, CAPILLARY: Glucose-Capillary: 185 mg/dL — ABNORMAL HIGH (ref 70–99)

## 2022-01-02 MED ORDER — FUROSEMIDE 10 MG/ML IJ SOLN
40.0000 mg | Freq: Every day | INTRAMUSCULAR | Status: DC
  Administered 2022-01-03: 40 mg via INTRAVENOUS
  Filled 2022-01-02: qty 4

## 2022-01-02 MED ORDER — METHYLPREDNISOLONE SODIUM SUCC 125 MG IJ SOLR
125.0000 mg | Freq: Once | INTRAMUSCULAR | Status: AC
  Administered 2022-01-02: 125 mg via INTRAVENOUS
  Filled 2022-01-02: qty 2

## 2022-01-02 MED ORDER — SERTRALINE HCL 50 MG PO TABS
25.0000 mg | ORAL_TABLET | Freq: Every day | ORAL | Status: DC
  Administered 2022-01-02 – 2022-01-04 (×3): 25 mg via ORAL
  Filled 2022-01-02 (×3): qty 1

## 2022-01-02 MED ORDER — SODIUM CHLORIDE 0.9% FLUSH: 3.0000 mL | INTRAVENOUS | Status: DC | PRN

## 2022-01-02 MED ORDER — PANTOPRAZOLE SODIUM 40 MG PO TBEC
40.0000 mg | DELAYED_RELEASE_TABLET | Freq: Every day | ORAL | Status: DC
  Administered 2022-01-02 – 2022-01-04 (×3): 40 mg via ORAL
  Filled 2022-01-02 (×3): qty 1

## 2022-01-02 MED ORDER — SODIUM CHLORIDE 0.9 % IV SOLN
1.0000 g | Freq: Once | INTRAVENOUS | Status: AC
  Administered 2022-01-02: 1 g via INTRAVENOUS
  Filled 2022-01-02: qty 10

## 2022-01-02 MED ORDER — ONDANSETRON HCL 4 MG/2ML IJ SOLN: 4.0000 mg | Freq: Once | INTRAMUSCULAR | Status: AC

## 2022-01-02 MED ORDER — ONDANSETRON HCL 4 MG PO TABS: 4.0000 mg | ORAL_TABLET | Freq: Four times a day (QID) | ORAL | Status: DC | PRN

## 2022-01-02 MED ORDER — FUROSEMIDE 10 MG/ML IJ SOLN
20.0000 mg | Freq: Once | INTRAMUSCULAR | Status: AC
  Administered 2022-01-02: 20 mg via INTRAVENOUS
  Filled 2022-01-02: qty 2

## 2022-01-02 MED ORDER — SODIUM CHLORIDE 0.9 % IV SOLN
1.0000 g | INTRAVENOUS | Status: DC
  Administered 2022-01-02 – 2022-01-03 (×2): 1 g via INTRAVENOUS
  Filled 2022-01-02 (×2): qty 10

## 2022-01-02 MED ORDER — IPRATROPIUM-ALBUTEROL 0.5-2.5 (3) MG/3ML IN SOLN
3.0000 mL | Freq: Four times a day (QID) | RESPIRATORY_TRACT | Status: DC
  Administered 2022-01-02 – 2022-01-03 (×3): 3 mL via RESPIRATORY_TRACT
  Filled 2022-01-02 (×3): qty 3

## 2022-01-02 MED ORDER — IOHEXOL 350 MG/ML SOLN
75.0000 mL | Freq: Once | INTRAVENOUS | Status: AC | PRN
  Administered 2022-01-02: 75 mL via INTRAVENOUS

## 2022-01-02 MED ORDER — POLYETHYLENE GLYCOL 3350 17 G PO PACK: 17.0000 g | PACK | Freq: Every day | ORAL | Status: DC | PRN

## 2022-01-02 MED ORDER — INSULIN ASPART 100 UNIT/ML IJ SOLN: 0.0000 [IU] | Freq: Every day | INTRAMUSCULAR | Status: DC

## 2022-01-02 MED ORDER — ONDANSETRON HCL 4 MG/2ML IJ SOLN: 4.0000 mg | Freq: Four times a day (QID) | INTRAMUSCULAR | Status: DC | PRN

## 2022-01-02 MED ORDER — BISACODYL 10 MG RE SUPP: 10.0000 mg | Freq: Every day | RECTAL | Status: DC | PRN

## 2022-01-02 MED ORDER — BUSPIRONE HCL 5 MG PO TABS
5.0000 mg | ORAL_TABLET | Freq: Three times a day (TID) | ORAL | Status: DC
  Administered 2022-01-02 – 2022-01-04 (×5): 5 mg via ORAL
  Filled 2022-01-02 (×5): qty 1

## 2022-01-02 MED ORDER — ALBUTEROL SULFATE (2.5 MG/3ML) 0.083% IN NEBU
5.0000 mg | INHALATION_SOLUTION | Freq: Once | RESPIRATORY_TRACT | Status: AC
  Administered 2022-01-02: 5 mg via RESPIRATORY_TRACT
  Filled 2022-01-02: qty 6

## 2022-01-02 MED ORDER — SODIUM CHLORIDE 0.9 % IV SOLN: 250.0000 mL | INTRAVENOUS | Status: DC | PRN

## 2022-01-02 MED ORDER — TRAZODONE HCL 50 MG PO TABS: 50.0000 mg | ORAL_TABLET | Freq: Every evening | ORAL | Status: DC | PRN

## 2022-01-02 MED ORDER — MOMETASONE FURO-FORMOTEROL FUM 100-5 MCG/ACT IN AERO
2.0000 | INHALATION_SPRAY | Freq: Two times a day (BID) | RESPIRATORY_TRACT | Status: DC
  Administered 2022-01-02 – 2022-01-04 (×4): 2 via RESPIRATORY_TRACT
  Filled 2022-01-02: qty 8.8

## 2022-01-02 MED ORDER — ACETAMINOPHEN 325 MG PO TABS
650.0000 mg | ORAL_TABLET | Freq: Four times a day (QID) | ORAL | Status: DC | PRN
  Administered 2022-01-03 – 2022-01-04 (×3): 650 mg via ORAL
  Filled 2022-01-02 (×3): qty 2

## 2022-01-02 MED ORDER — ATORVASTATIN CALCIUM 20 MG PO TABS
20.0000 mg | ORAL_TABLET | Freq: Every day | ORAL | Status: DC
  Administered 2022-01-02 – 2022-01-04 (×3): 20 mg via ORAL
  Filled 2022-01-02 (×3): qty 1

## 2022-01-02 MED ORDER — ONDANSETRON HCL 4 MG/2ML IJ SOLN
INTRAMUSCULAR | Status: AC
  Administered 2022-01-02: 4 mg via INTRAVENOUS
  Filled 2022-01-02: qty 2

## 2022-01-02 MED ORDER — INSULIN ASPART 100 UNIT/ML IJ SOLN
3.0000 [IU] | Freq: Three times a day (TID) | INTRAMUSCULAR | Status: DC
  Administered 2022-01-03 – 2022-01-04 (×2): 3 [IU] via SUBCUTANEOUS

## 2022-01-02 MED ORDER — METHYLPREDNISOLONE SODIUM SUCC 40 MG IJ SOLR
40.0000 mg | Freq: Two times a day (BID) | INTRAMUSCULAR | Status: DC
  Administered 2022-01-02 – 2022-01-04 (×4): 40 mg via INTRAVENOUS
  Filled 2022-01-02 (×4): qty 1

## 2022-01-02 MED ORDER — SODIUM CHLORIDE 0.9% FLUSH
3.0000 mL | Freq: Two times a day (BID) | INTRAVENOUS | Status: DC
  Administered 2022-01-02 – 2022-01-04 (×4): 3 mL via INTRAVENOUS

## 2022-01-02 MED ORDER — ALBUTEROL SULFATE (2.5 MG/3ML) 0.083% IN NEBU: 2.5000 mg | INHALATION_SOLUTION | RESPIRATORY_TRACT | Status: DC | PRN

## 2022-01-02 MED ORDER — SODIUM CHLORIDE 0.9 % IV SOLN
500.0000 mg | Freq: Once | INTRAVENOUS | Status: AC
  Administered 2022-01-02: 500 mg via INTRAVENOUS
  Filled 2022-01-02: qty 5

## 2022-01-02 MED ORDER — SACUBITRIL-VALSARTAN 24-26 MG PO TABS
1.0000 | ORAL_TABLET | Freq: Two times a day (BID) | ORAL | Status: DC
  Administered 2022-01-04: 1 via ORAL
  Filled 2022-01-02: qty 1

## 2022-01-02 MED ORDER — INSULIN ASPART 100 UNIT/ML IJ SOLN
0.0000 [IU] | Freq: Three times a day (TID) | INTRAMUSCULAR | Status: DC
  Administered 2022-01-03: 2 [IU] via SUBCUTANEOUS
  Administered 2022-01-04: 1 [IU] via SUBCUTANEOUS

## 2022-01-02 MED ORDER — FUROSEMIDE 10 MG/ML IJ SOLN
40.0000 mg | Freq: Every day | INTRAMUSCULAR | Status: DC
  Filled 2022-01-02: qty 4

## 2022-01-02 MED ORDER — AZITHROMYCIN 500 MG IV SOLR
500.0000 mg | INTRAVENOUS | Status: DC
  Administered 2022-01-02 – 2022-01-03 (×2): 500 mg via INTRAVENOUS
  Filled 2022-01-02 (×2): qty 5

## 2022-01-02 MED ORDER — DM-GUAIFENESIN ER 30-600 MG PO TB12
1.0000 | ORAL_TABLET | Freq: Two times a day (BID) | ORAL | Status: DC
  Administered 2022-01-03: 1 via ORAL
  Filled 2022-01-02 (×4): qty 1

## 2022-01-02 MED ORDER — ENOXAPARIN SODIUM 60 MG/0.6ML IJ SOSY
50.0000 mg | PREFILLED_SYRINGE | INTRAMUSCULAR | Status: DC
  Administered 2022-01-02 – 2022-01-03 (×2): 50 mg via SUBCUTANEOUS
  Filled 2022-01-02 (×3): qty 0.6

## 2022-01-02 MED ORDER — ACETAMINOPHEN 650 MG RE SUPP: 650.0000 mg | Freq: Four times a day (QID) | RECTAL | Status: DC | PRN

## 2022-01-02 MED ORDER — BISOPROLOL FUMARATE 5 MG PO TABS
5.0000 mg | ORAL_TABLET | Freq: Every day | ORAL | Status: DC
  Administered 2022-01-02 – 2022-01-04 (×3): 5 mg via ORAL
  Filled 2022-01-02 (×3): qty 1

## 2022-01-02 NOTE — H&P (Signed)
Patient Demographics:    Holly Fuentes, is a 51 y.o. female  MRN: [id number]   DOB - 07-16-1971  Admit Date - 01/02/2022  Outpatient Primary MD for the patient is Waldon Reining, MD   Assessment & Plan:   Assessment and Plan:  1)Acute COPD Exacerbation--patient is a reformed smoker -Treat empirically with IV Solu-Medrol, -Rocephin and azithromycin, bronchodilators and mucolytics -CTA chest with possible infection  2)Abnormal CT chest/pulmonary infection --- -CTA chest shows Diffuse tree-in-bud opacities throughout both lungs compatible with infectious / inflammatory process. -Right hilar lymphadenopathy may be reactive. --Procalcitonin 0.31,  --WBCs 19.5 -COVID and flu negative -Antibiotics as above #1 -Patient was supposed to see Dr. Vassie Loll as outpatient -We will see if she can be seen here by pulmonologist Dr. Vassie Loll during this hospital stay  3) chronic systolic dysfunction CHF/cardiomyopathy-- -Prior echo from August 2022 with EF of 35 to 40% -CTA chest on 01/02/2022 shows  --Enlarged main pulmonary artery which can be seen with pulmonary artery hypertension. -BNP somewhat elevated at 233 -Repeat echocardiogram -IV Lasix as ordered -Daily weight, fluid input and output monitoring -Hold Entresto given contrast exposure -Change Toprol-XL to bisoprolol due to pulmonary/bronchial spasm concerns  4)Acute hypoxic respiratory failure In the ED patient required high flow oxygen at 10 L -After interventions she has been weaned down to about 4 to 5 L of oxygen at this time -Previously was not on O2 at home  5)Depression/anxiety and PTSD--- stable, continue Zoloft and BuSpar  6) class II obesity- -Low calorie diet, portion control and increase physical activity discussed with patient -Body mass index is  39.17 kg/m.  7) hyperglycemia----prior A1c was 6.2 -Anticipate worsening hyperglycemia with steroids Use Novolog/Humalog Sliding scale insulin with Accu-Cheks/Fingersticks as ordered   Disposition/Need for in-Hospital Stay- patient unable to be discharged at this time due to -acute hypoxic respiratory failure due to combination of CHF and COPD/pulmonary infection requiring IV antibiotics, IV diuretics and supplemental oxygen  Dispo: The patient is from: Home              Anticipated d/c is to: Home              Anticipated d/c date is: 1 day              Patient currently is not medically stable to d/c. Barriers: Not Clinically Stable-    With History of - Reviewed by me  Past Medical History:  Diagnosis Date   Abnormal chest CT    Anxiety    CHF (congestive heart failure) (HCC)    COPD (chronic obstructive pulmonary disease) (HCC)    Depression    HFrEF (heart failure with reduced ejection fraction) (HCC)    a.EF 35-40% by echo in 03/2021   HLD (hyperlipidemia)    Hypertension    PTSD (post-traumatic stress disorder)    Tobacco abuse       Past Surgical History:  Procedure Laterality Date   BACK  SURGERY     CESAREAN SECTION        Chief Complaint  Patient presents with   Shortness of Breath      HPI:    Holly Fuentes  is a 51 y.o. female reformed smoker (quit in 2022) with past medical history relevant for  COPD, systolic dysfunction CHF/cardiomyopathy with EF of 35 to 40% , depression/anxiety and PTSD and obesity presents to the ED with a 3-day history of cough, chills, worsening shortness of breath -Denies chest pains palpitations dizziness no leg pains no pleuritic symptoms -In the ED patient required high flow oxygen at 10 L -After interventions she has been weaned down to about 4 to 5 L of oxygen at this time -Previously was not on O2 at home -Procalcitonin 0.31, COVID and flu negative -BNP somewhat elevated at 233 -WBCs 19.5 Hgb 13.7 and platelets  315 -CTA chest shows Diffuse tree-in-bud opacities throughout both lungs compatible with infectious/inflammatory process. -Right hilar lymphadenopathy may be reactive. Follow-up recommended. -Enlarged main pulmonary artery which can be seen with pulmonary artery hypertension. -  Review of systems:    In addition to the HPI above,   A full Review of  Systems was done, all other systems reviewed are negative except as noted above in HPI , .    Social History:  Reviewed by me    Social History   Tobacco Use   Smoking status: Former    Packs/day: 0.50    Types: Cigarettes    Quit date: 11/14/2020    Years since quitting: 1.1   Smokeless tobacco: Never  Substance Use Topics   Alcohol use: No     Family History :  Reviewed by me    Family History  Problem Relation Age of Onset   Hypertension Mother    Cancer Maternal Grandmother    Heart disease Maternal Grandmother    Heart disease Maternal Grandfather      Home Medications:   Prior to Admission medications   Medication Sig Start Date End Date Taking? Authorizing Provider  albuterol (VENTOLIN HFA) 108 (90 Base) MCG/ACT inhaler Inhale 2 puffs into the lungs every 6 (six) hours as needed for wheezing or shortness of breath. 03/19/21  Yes Erick Blinks, MD  albuterol (VENTOLIN HFA) 108 (90 Base) MCG/ACT inhaler Inhale 1 puff into the lungs daily as needed for shortness of breath or wheezing.   Yes [provider]  atorvastatin (LIPITOR) 20 MG tablet Take 20 mg by mouth daily. 08/24/21  Yes [provider]  furosemide (LASIX) 20 MG tablet Take 1 tablet (20 mg total) by mouth daily. 03/19/21 03/19/22 Yes Memon, Durward Mallard, MD  sacubitril-valsartan (ENTRESTO) 24-26 MG Take 1 tablet by mouth 2 (two) times daily. 09/24/21  Yes BranchDorothe Pea, MD  sertraline (ZOLOFT) 25 MG tablet Take 25 mg by mouth daily. 05/21/21  Yes [provider]  budesonide-formoterol (SYMBICORT) 80-4.5 MCG/ACT inhaler Inhale 2 puffs into  the lungs in the morning and at bedtime. Patient not taking: Reported on 09/24/2021 03/19/21   Erick Blinks, MD  pantoprazole (PROTONIX) 40 MG tablet Take 1 tablet (40 mg total) by mouth daily. Patient not taking: Reported on 01/02/2022 03/20/21   Erick Blinks, MD     Allergies:     Allergies  Allergen Reactions   Cranberry Hives   Hydrocodone-Acetaminophen Other (See Comments)    Reaction : migraines     Physical Exam:   Vitals  Blood pressure 112/89, pulse (!) 54, temperature 97.9 F (36.6  C), temperature source Oral, resp. rate 19, height 5\' 3"  (1.6 m), weight 100.3 kg, last menstrual period 01/18/2016, SpO2 92 %.  Physical Examination: General appearance - alert, some conversational dyspnea  mental status - alert, oriented to person, place, and time,  Eyes - sclera anicteric Nose- [place] 5L/min Neck - supple, no JVD elevation , Chest -diminished breath sounds, no wheezing Heart - S1 and S2 normal, regular  Abdomen - soft, nontender, nondistended, +BS Neurological - screening mental status exam normal, neck supple without rigidity, cranial nerves II through XII intact, DTR's normal and symmetric Extremities - no pedal edema noted, intact peripheral pulses  Skin - warm, dry     Data Review:    CBC Recent Labs  Lab 01/02/22 0713  WBC 19.5*  HGB 13.7  HCT 41.6  PLT 315  MCV 91.6  MCH 30.2  MCHC 32.9  RDW 13.2   ------------------------------------------------------------------------------------------------------------------  Chemistries  Recent Labs  Lab 01/02/22 0713  NA 139  K 3.7  CL 101  CO2 28  GLUCOSE 148*  BUN 17  CREATININE 0.68  CALCIUM 8.6*   ------------------------------------------------------------------------------------------------------------------ estimated creatinine clearance is 96.2 mL/min (by C-G formula based on SCr of 0.68  mg/dL). ------------------------------------------------------------------------------------------------------------------ No results for input(s): TSH, T4TOTAL, T3FREE, THYROIDAB in the last 72 hours.  Invalid input(s): FREET3   Coagulation profile No results for input(s): INR, PROTIME in the last 168 hours. ------------------------------------------------------------------------------------------------------------------- No results for input(s): DDIMER in the last 72 hours. -------------------------------------------------------------------------------------------------------------------  Cardiac Enzymes No results for input(s): CKMB, TROPONINI, MYOGLOBIN in the last 168 hours.  Invalid input(s): CK ------------------------------------------------------------------------------------------------------------------    Component Value Date/Time   BNP 233.0 (H) 01/02/2022 0713     ---------------------------------------------------------------------------------------------------------------  Urinalysis    Component Value Date/Time   COLORURINE YELLOW 08/18/2011 1320   APPEARANCEUR HAZY (A) 08/18/2011 1320   LABSPEC >1.030 (H) 08/18/2011 1320   PHURINE 5.5 08/18/2011 1320   GLUCOSEU 100 (A) 08/18/2011 1320   HGBUR LARGE (A) 08/18/2011 1320   BILIRUBINUR NEGATIVE 08/18/2011 1320   KETONESUR NEGATIVE 08/18/2011 1320   PROTEINUR 100 (A) 08/18/2011 1320   UROBILINOGEN 2.0 (H) 08/18/2011 1320   NITRITE POSITIVE (A) 08/18/2011 1320   LEUKOCYTESUR NEGATIVE 08/18/2011 1320    ----------------------------------------------------------------------------------------------------------------   Imaging Results:    CT Angio Chest Pulmonary Embolism (PE) W or WO Contrast  Result Date: 01/02/2022 CLINICAL DATA:  COPD and CHF.  Dyspnea.  Cough. EXAM: CT ANGIOGRAPHY CHEST WITH CONTRAST TECHNIQUE: Multidetector CT imaging of the chest was performed using the standard protocol during bolus  administration of intravenous contrast. Multiplanar CT image reconstructions and MIPs were obtained to evaluate the vascular anatomy. RADIATION DOSE REDUCTION: This exam was performed according to the departmental dose-optimization program which includes automated exposure control, adjustment of the mA and/or kV according to patient size and/or use of iterative reconstruction technique. CONTRAST:  62mL OMNIPAQUE IOHEXOL 350 MG/ML SOLN COMPARISON:  Chest x-ray 01/02/2022.  CT chest 03/16/2021. FINDINGS: Cardiovascular: Heart and aorta are normal in size. The main pulmonary artery is enlarged. There is adequate opacification of the pulmonary arteries to the segmental level. There is no evidence for pulmonary embolism. Mediastinum/Nodes: Visualized thyroid gland is within normal limits. There are diffuse nonenlarged mediastinal lymph nodes. There is enlarged right hilar lymph node measuring 14 mm short axis. There are nonenlarged left hilar lymph nodes. Esophagus is within normal limits. Lungs/Pleura: There are diffuse tree-in-bud opacities throughout both lungs. There is no focal lung consolidation, pleural effusion, or pneumothorax. Trachea and central airways are  patent. Upper Abdomen: No acute abnormality. Musculoskeletal: No chest wall abnormality. No acute or significant osseous findings. Review of the MIP images confirms the above findings. IMPRESSION: 1. No evidence for pulmonary embolism. 2. Diffuse tree-in-bud opacities throughout both lungs compatible with infectious/inflammatory process. 3. Right hilar lymphadenopathy may be reactive. Follow-up recommended. 4. Enlarged main pulmonary artery which can be seen with pulmonary artery hypertension. Electronically Signed   By: Darliss Cheney M.D.   On: 01/02/2022 16:22   DG Chest Port 1 View  Result Date: 01/02/2022 CLINICAL DATA:  51 year old female with productive cough for 1 week. EXAM: PORTABLE CHEST 1 VIEW COMPARISON:  Chest CT 03/16/2021 and earlier.  FINDINGS: Portable AP upright view at 0738 hours. Lung volumes and mediastinal contours remain normal. Visualized tracheal air column is within normal limits. Questionable ongoing mild increased interstitial opacity which could correspond to ongoing interstitial abnormality described on CT last year. But otherwise when allowing for portable technique the lungs are clear. No pneumothorax or pleural effusion. Negative visible bowel gas. No acute osseous abnormality identified. IMPRESSION: Questionable unresolved diffuse pulmonary interstitial process as seen on Chest CT 03/16/2021 (please see that report)). Otherwise negative portable chest. Electronically Signed   By: Odessa Fleming M.D.   On: 01/02/2022 07:57    Radiological Exams on Admission: CT Angio Chest Pulmonary Embolism (PE) W or WO Contrast  Result Date: 01/02/2022 CLINICAL DATA:  COPD and CHF.  Dyspnea.  Cough. EXAM: CT ANGIOGRAPHY CHEST WITH CONTRAST TECHNIQUE: Multidetector CT imaging of the chest was performed using the standard protocol during bolus administration of intravenous contrast. Multiplanar CT image reconstructions and MIPs were obtained to evaluate the vascular anatomy. RADIATION DOSE REDUCTION: This exam was performed according to the departmental dose-optimization program which includes automated exposure control, adjustment of the mA and/or kV according to patient size and/or use of iterative reconstruction technique. CONTRAST:  75mL OMNIPAQUE IOHEXOL 350 MG/ML SOLN COMPARISON:  Chest x-ray 01/02/2022.  CT chest 03/16/2021. FINDINGS: Cardiovascular: Heart and aorta are normal in size. The main pulmonary artery is enlarged. There is adequate opacification of the pulmonary arteries to the segmental level. There is no evidence for pulmonary embolism. Mediastinum/Nodes: Visualized thyroid gland is within normal limits. There are diffuse nonenlarged mediastinal lymph nodes. There is enlarged right hilar lymph node measuring 14 mm short axis.  There are nonenlarged left hilar lymph nodes. Esophagus is within normal limits. Lungs/Pleura: There are diffuse tree-in-bud opacities throughout both lungs. There is no focal lung consolidation, pleural effusion, or pneumothorax. Trachea and central airways are patent. Upper Abdomen: No acute abnormality. Musculoskeletal: No chest wall abnormality. No acute or significant osseous findings. Review of the MIP images confirms the above findings. IMPRESSION: 1. No evidence for pulmonary embolism. 2. Diffuse tree-in-bud opacities throughout both lungs compatible with infectious/inflammatory process. 3. Right hilar lymphadenopathy may be reactive. Follow-up recommended. 4. Enlarged main pulmonary artery which can be seen with pulmonary artery hypertension. Electronically Signed   By: Darliss Cheney M.D.   On: 01/02/2022 16:22   DG Chest Port 1 View  Result Date: 01/02/2022 CLINICAL DATA:  51 year old female with productive cough for 1 week. EXAM: PORTABLE CHEST 1 VIEW COMPARISON:  Chest CT 03/16/2021 and earlier. FINDINGS: Portable AP upright view at 0738 hours. Lung volumes and mediastinal contours remain normal. Visualized tracheal air column is within normal limits. Questionable ongoing mild increased interstitial opacity which could correspond to ongoing interstitial abnormality described on CT last year. But otherwise when allowing for portable technique the lungs are clear.  No pneumothorax or pleural effusion. Negative visible bowel gas. No acute osseous abnormality identified. IMPRESSION: Questionable unresolved diffuse pulmonary interstitial process as seen on Chest CT 03/16/2021 (please see that report)). Otherwise negative portable chest. Electronically Signed   By: Odessa FlemingH  Hall M.D.   On: 01/02/2022 07:57    DVT Prophylaxis -SCD /Heparin AM Labs Ordered, also please review Full Orders  Family Communication: Admission, patients condition and plan of care including tests being ordered have been discussed with  the patient  who indicate understanding and agree with the plan   Condition  -Stable  Shon Haleourage Hiliana Eilts M.D on 01/02/2022 at 5:19 PM Go to [email or web address] -  for contact info  [facility] - Office  [phone]

## 2022-01-02 NOTE — ED Notes (Signed)
EDP at [facility] 

## 2022-01-02 NOTE — ED Notes (Addendum)
Sputum sample saved, at [facility].

## 2022-01-02 NOTE — ED Notes (Signed)
CT notified pt ready ?

## 2022-01-02 NOTE — ED Notes (Signed)
Report called to floor. Pt to CT now.

## 2022-01-02 NOTE — ED Notes (Signed)
Back from CT, preparing for transport, no changes.

## 2022-01-02 NOTE — ED Triage Notes (Signed)
Pt arrived via [facility] c/o respiratory distress. Pt reports Hx of COPD, CHF and productive cough X 3 days with green sputum and drainage from left ear. Pt diaphoretic in Triage and 80% on RA. Pt placed on 4.5L Nasal Cannula and O2 Sats 100%. Pts left ear presents irritated, painful on palpitation and blood present in ear. Pt denies trauma.

## 2022-01-02 NOTE — ED Provider Notes (Signed)
[facility] Provider Note   CSN: [id number] Arrival date & time: 01/02/22  [id number]     History  Chief Complaint  Patient presents with   Shortness of Breath    Holly KocherSusan R Fuentes is a 51 y.o. female.  HPI 51 year old female history of COPD, CHF, presents today complaining of dyspnea.  Says that she been having a cough 2 to 3 days ago with associated left ear pain.  Her cough has been productive of yellow to green sputum.  She woke up this morning Discussed currently dyspneic.  Per triage notes she arrived with oxygen saturations at 80%.  She is not on home oxygen.  She was placed on oxygen here and had sats improved.  She remains tachycardic and tachypneic.  She denies any head pain, chest pain, leg swelling, history of DVT or PE.  Does not feel she has gained any fluid weight.     Home Medications Prior to Admission medications   Medication Sig Start Date End Date Taking? Authorizing Provider  albuterol (VENTOLIN HFA) 108 (90 Base) MCG/ACT inhaler Inhale 2 puffs into the lungs every 6 (six) hours as needed for wheezing or shortness of breath. 03/19/21   Erick BlinksMemon, Jehanzeb, MD  atorvastatin (LIPITOR) 20 MG tablet Take 20 mg by mouth daily. 08/24/21   [provider]  budesonide-formoterol (SYMBICORT) 80-4.5 MCG/ACT inhaler Inhale 2 puffs into the lungs in the morning and at bedtime. Patient not taking: Reported on 09/24/2021 03/19/21   Erick BlinksMemon, Jehanzeb, MD  furosemide (LASIX) 20 MG tablet Take 1 tablet (20 mg total) by mouth daily. 03/19/21 03/19/22  Erick BlinksMemon, Jehanzeb, MD  metoprolol succinate (TOPROL-XL) 25 MG 24 hr tablet Take 1 tablet (25 mg total) by mouth 2 (two) times daily. 07/24/21   Strader, Lennart PallBrittany M, PA-C  pantoprazole (PROTONIX) 40 MG tablet Take 1 tablet (40 mg total) by mouth daily. 03/20/21   Erick BlinksMemon, Jehanzeb, MD  sacubitril-valsartan (ENTRESTO) 24-26 MG Take 1 tablet by mouth 2 (two) times daily. 09/24/21   Antoine PocheBranch, Jonathan F, MD      Allergies    Cranberry and  Hydrocodone-acetaminophen    Review of Systems   Review of Systems  All other systems reviewed and are negative.  Physical Exam Updated Vital Signs BP 138/69   Pulse (!) 114   Temp 99.1 F (37.3 C) (Oral)   Resp (!) 27   Ht 1.6 m (5\' 3" )   Wt 105 kg   LMP 01/18/2016 (Approximate)   SpO2 93%   BMI 41.01 kg/m  Physical Exam Vitals and nursing note reviewed.  Constitutional:      General: She is not in acute distress.    Appearance: She is well-developed. She is ill-appearing.  HENT:     Head: Normocephalic and atraumatic.     Right Ear: External ear normal.     Left Ear: External ear normal.     Nose: Nose normal.  Eyes:     Conjunctiva/sclera: Conjunctivae normal.     Pupils: Pupils are equal, round, and reactive to light.  Pulmonary:     Effort: Pulmonary effort is normal. Tachypnea present.     Breath sounds: Decreased breath sounds present.  Abdominal:     General: Bowel sounds are normal.     Palpations: Abdomen is soft.  Musculoskeletal:        General: Normal range of motion.     Cervical back: Normal range of motion and neck supple.  Skin:    General: Skin is warm  and dry.  Neurological:     General: No focal deficit present.     Mental Status: She is alert and oriented to person, place, and time.     Motor: No abnormal muscle tone.     Coordination: Coordination normal.  Psychiatric:        Behavior: Behavior normal.        Thought Content: Thought content normal.    ED Results / Procedures / Treatments   Labs (all labs ordered are listed, but only abnormal results are displayed) Labs Reviewed  CBC - Abnormal; Notable for the following components:      Result Value   WBC 19.5 (*)    All other components within normal limits  BRAIN NATRIURETIC PEPTIDE - Abnormal; Notable for the following components:   B Natriuretic Peptide 233.0 (*)    All other components within normal limits  BASIC METABOLIC PANEL - Abnormal; Notable for the following  components:   Glucose, Bld 148 (*)    Calcium 8.6 (*)    All other components within normal limits  RESP PANEL BY RT-PCR (FLU A&B, COVID) [id number]    EKG EKG Interpretation  Date/Time:  Saturday Jan 02 2022 06:50:35 EDT Ventricular Rate:  116 PR Interval:  123 QRS Duration: 83 QT Interval:  328 QTC Calculation: 456 R Axis:   86 Text Interpretation: Sinus tachycardia Right atrial enlargement Low voltage, precordial leads Confirmed by Zadie Rhine ([id number]) on 01/02/2022 6:52:46 AM  Radiology DG Chest Port 1 View  Result Date: 01/02/2022 CLINICAL DATA:  51 year old female with productive cough for 1 week. EXAM: PORTABLE CHEST 1 VIEW COMPARISON:  Chest CT 03/16/2021 and earlier. FINDINGS: Portable AP upright view at 0738 hours. Lung volumes and mediastinal contours remain normal. Visualized tracheal air column is within normal limits. Questionable ongoing mild increased interstitial opacity which could correspond to ongoing interstitial abnormality described on CT last year. But otherwise when allowing for portable technique the lungs are clear. No pneumothorax or pleural effusion. Negative visible bowel gas. No acute osseous abnormality identified. IMPRESSION: Questionable unresolved diffuse pulmonary interstitial process as seen on Chest CT 03/16/2021 (please see that report)). Otherwise negative portable chest. Electronically Signed   By: Odessa Fleming M.D.   On: 01/02/2022 07:57    Procedures Procedures    Medications Ordered in ED Medications  furosemide (LASIX) injection 20 mg (has no administration in time range)  methylPREDNISolone sodium succinate (SOLU-MEDROL) 125 mg/2 mL injection 125 mg (125 mg Intravenous Given 01/02/22 0730)  albuterol (PROVENTIL) (2.5 MG/3ML) 0.083% nebulizer solution 5 mg (5 mg Nebulization Given 01/02/22 0720)  cefTRIAXone (ROCEPHIN) 1 g in sodium chloride 0.9 % 100 mL IVPB (0 g Intravenous Stopped 01/02/22 0925)  azithromycin (ZITHROMAX) 500 mg in sodium  chloride 0.9 % 250 mL IVPB (0 mg Intravenous Stopped 01/02/22 0925)  ondansetron (ZOFRAN) injection 4 mg (4 mg Intravenous Given 01/02/22 0831)  albuterol (PROVENTIL) (2.5 MG/3ML) 0.083% nebulizer solution 5 mg (5 mg Nebulization Given 01/02/22 0855)    ED Course/ Medical Decision Making/ A&P Clinical Course as of 01/02/22 1026  Sat Jan 02, 2022  0754 Chest x-Nobie Alleyne personally reviewed and interpreted no evidence of acute focal infiltrate or pneumothorax noted Radiologist interpretation notes questionable unresolved diffuse pulmonary interstitial process as seen on chest CT of 03/16/2021 [DR]  0754 CBC(!) CBC reviewed interpreted significant for elevated white blood cell count 19,500 [DR]  0755 9 months ago white blood cell count was normal in comparison [DR]  0755 EKG personally reviewed and  interpreted with sinus tachycardia [DR]  0759 DG Chest [facility] [DR]  [phone] Chemistry reviewed interpreted and glucose is elevated at 148 with no evidence of DKA Mild hypocalcemia with calcium of 8.6 Respiratory panel reviewed interpreted and negative for COVID, influenza A or influenza B [DR]  0856 Brain natriuretic peptide(!) BNP is mildly elevated at 233 and will give dose of Lasix. [DR]  [phone] Patient reevaluated and is less tachypneic and tachycardic. She has some increased air movement and increased wheezing associated with the increased air movement. Oxygen sats remain 91% on 4 L Clinically patient appears somewhat improved [DR]    Clinical Course User Index [DR] Margarita Grizzle, MD                           Medical Decision Making Patient with acute dyspnea and hypoxic respiratory failure. Patient has history of COPD, CHF, Differential diagnosis includes but is not limited to pneumonia, exacerbation of COPD, exacerbation of CHF, other acute intrinsic lung abnormalities, other acute cardiology etiologies such as cardiac ischemia Patient with new oxygen requirement with baseline no oxygen, presented  with sats 80% now 9 on 4 liters Patient with some chronic nodularities noted on chest x-Armanie Martine She continues to have coughing productive of copious yellow sputum She is treated here with Rocephin and Zithromax for lung infection Solu-Medrol IV is given She has received 5 mg of albuterol and is receiving an additional 5 mg As noted above, patient continues to have a new oxygen demand. Patient with productive sputum and likely COPD exacerbation. Plan consult for admission  Amount and/or Complexity of Data Reviewed External Data Reviewed: notes.    Details: Reviewed prior admission and discharge summary from July/August 2022 Labs: ordered. Decision-making details documented in ED Course. Radiology: ordered and independent interpretation performed. Decision-making details documented in ED Course. ECG/medicine tests: ordered and independent interpretation performed. Decision-making details documented in ED Course.  Risk Prescription drug management. Decision regarding hospitalization.   Dr. Mariea Clonts has seen patient and written admission orders        Final Clinical Impression(s) / ED Diagnoses Final diagnoses:  COPD exacerbation [facility])    Rx / DC Orders ED Discharge Orders     None         Margarita Grizzle, MD 01/02/22 1026

## 2022-01-02 NOTE — ED Notes (Signed)
New IV established, CT here for pt, pt back to CT via stretcher, no changes, alert, NAD, calm, interactive.

## 2022-01-02 NOTE — ED Notes (Signed)
Neb complete, returned to [place] 1L, xray at [facility]

## 2022-01-02 NOTE — ED Notes (Signed)
Nauseated, vomiting.

## 2022-01-03 ENCOUNTER — Other Ambulatory Visit (HOSPITAL_COMMUNITY): Payer: Self-pay | Admitting: *Deleted

## 2022-01-03 ENCOUNTER — Observation Stay (HOSPITAL_COMMUNITY): Payer: Medicaid Other

## 2022-01-03 DIAGNOSIS — I5021 Acute systolic (congestive) heart failure: Secondary | ICD-10-CM

## 2022-01-03 LAB — GLUCOSE, CAPILLARY
Glucose-Capillary: 180 mg/dL — ABNORMAL HIGH (ref 70–99)
Glucose-Capillary: 234 mg/dL — ABNORMAL HIGH (ref 70–99)
Glucose-Capillary: 116 mg/dL — ABNORMAL HIGH (ref 70–99)
Glucose-Capillary: 192 mg/dL — ABNORMAL HIGH (ref 70–99)

## 2022-01-03 LAB — ECHOCARDIOGRAM COMPLETE
Area-P 1/2: 6.32 cm2
Height: 63 in
Weight: 3537.94 oz

## 2022-01-03 MED ORDER — IPRATROPIUM-ALBUTEROL 0.5-2.5 (3) MG/3ML IN SOLN
3.0000 mL | Freq: Three times a day (TID) | RESPIRATORY_TRACT | Status: DC
  Administered 2022-01-03 – 2022-01-04 (×2): 3 mL via RESPIRATORY_TRACT
  Filled 2022-01-03: qty 6
  Filled 2022-01-03: qty 3

## 2022-01-03 MED ORDER — FUROSEMIDE 10 MG/ML IJ SOLN
20.0000 mg | Freq: Every day | INTRAMUSCULAR | Status: DC
  Administered 2022-01-04: 20 mg via INTRAVENOUS
  Filled 2022-01-03: qty 2

## 2022-01-03 NOTE — Progress Notes (Signed)
Central Monitoring called to inform this RN that patient had a 3 beat run of nonsustained VTach  with couplet PVC's. This RN checked on patient and she had just returned from ambulating to restroom without oxygen. She denies chest pain, increased shortness of breath, or feeling like her heart is beating any different. This RN informed her that I would check on her throughout the night and she requested that if she is asleep to not bother her. Holly Fuentes

## 2022-01-03 NOTE — Progress Notes (Signed)
  Echocardiogram 2D Echocardiogram has been performed.  Holly Fuentes 01/03/2022, 12:18 PM

## 2022-01-03 NOTE — Progress Notes (Addendum)
PROGRESS NOTE     Holly Fuentes, is a 51 y.o. female, DOB - 10-23-1970, [id number]  Admit date - 01/02/2022   Admitting Physician Asja Frommer Denton Brick, MD  Outpatient Primary MD for the patient is [name], MD  LOS - 0  Chief Complaint  Patient presents with   Shortness of Breath        Brief Narrative:  51 y.o. 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 -Right hilar lymphadenopathy may be reactive. --Procalcitonin 0.31,  --WBCs 19.5 -COVID and flu negative -Antibiotics as above #1 -Patient was supposed to see Dr. Elsworth Soho as outpatient -Inpatient pulmonology consult requested   3)HFrEF-h/o chronic systolic dysfunction CHF/cardiomyopathy-- -Prior echo from August 2022 with EF of 35 to 40% -Repeat echo from 01/03/2022 with EF up to 65 to 70% -CTA chest on 01/02/2022 shows  --Enlarged main pulmonary artery which can be seen with pulmonary artery hypertension (unable to assess PA pressure on echo from 01/03/2022) -BNP somewhat elevated at 233 -Continue IV Lasix as ordered -Daily weight, fluid input and output monitoring -Hold Entresto given contrast exposure -Change Toprol-XL to bisoprolol due to pulmonary/bronchial spasm concerns -Urine output was not documented   4)Acute hypoxic respiratory failure In the ED patient required high  flow oxygen at 10 L --Continue to wean down O2 -Previously was not on O2 at home   5)Depression/anxiety and PTSD--- stable, continue Zoloft and BuSpar   6) class II obesity- -Low calorie diet, portion control and increase physical activity discussed with patient -Body mass index is 39.17 kg/m.   7) hyperglycemia---- A1c was 6.0 reflecting excellent diabetic control PTA -Anticipate worsening hyperglycemia with steroids Use Novolog/Humalog Sliding scale insulin with Accu-Cheks/Fingersticks as ordered    Disposition/Need for in-Hospital Stay- patient unable to be discharged at this time due to -acute hypoxic respiratory failure due to combination of CHF and COPD/pulmonary infection requiring IV antibiotics, IV diuretics and supplemental oxygen   Dispo: The patient is from: Home              Anticipated d/c is to: Home              Anticipated d/c date is: 1 day              Patient currently is not medically stable to d/c. Barriers: Not Clinically Stable-     Code Status :  -  Code Status: Full Code   Family Communication:    (patient is alert, awake and coherent)   DVT Prophylaxis  :   - SCDs   SCDs Start: 01/02/22 1716 Place TED hose Start: 01/02/22 1716  Lab Results  Component Value Date   PLT 315 01/02/2022   Inpatient Medications  Scheduled Meds:  atorvastatin  20 mg Oral Daily   bisoprolol  5 mg Oral Daily   busPIRone  5 mg Oral TID   dextromethorphan-guaiFENesin  1 tablet Oral BID   enoxaparin (LOVENOX) injection  50 mg Subcutaneous Q24H   furosemide  40  mg Intravenous Daily   insulin aspart  0-5 Units Subcutaneous QHS   insulin aspart  0-6 Units Subcutaneous TID WC   insulin aspart  3 Units Subcutaneous TID WC   ipratropium-albuterol  3 mL Nebulization Q6H   methylPREDNISolone (SOLU-MEDROL) injection  40 mg Intravenous Q12H   mometasone-formoterol  2 puff Inhalation BID   pantoprazole  40 mg Oral Daily   [START ON 01/04/2022] sacubitril-valsartan  1 tablet Oral  BID   sertraline  25 mg Oral Daily   sodium chloride flush  3 mL Intravenous Q12H   sodium chloride flush  3 mL Intravenous Q12H   Continuous Infusions:  sodium chloride     azithromycin 500 mg (01/02/22 1816)   cefTRIAXone (ROCEPHIN)  IV 1 g (01/02/22 1737)   PRN Meds:.sodium chloride, acetaminophen **OR** acetaminophen, albuterol, bisacodyl, ondansetron **OR** ondansetron (ZOFRAN) IV, polyethylene glycol, sodium chloride flush, traZODone   Anti-infectives (From admission, onward)    Start     Dose/Rate Route Frequency Ordered Stop   01/02/22 1830  azithromycin (ZITHROMAX) 500 mg in sodium chloride 0.9 % 250 mL IVPB        500 mg 250 mL/hr over 60 Minutes Intravenous Every 24 hours 01/02/22 1732     01/02/22 1815  cefTRIAXone (ROCEPHIN) 1 g in sodium chloride 0.9 % 100 mL IVPB        1 g 200 mL/hr over 30 Minutes Intravenous Every 24 hours 01/02/22 1718     01/02/22 0815  cefTRIAXone (ROCEPHIN) 1 g in sodium chloride 0.9 % 100 mL IVPB        1 g 200 mL/hr over 30 Minutes Intravenous  Once 01/02/22 0801 01/02/22 0925   01/02/22 0815  azithromycin (ZITHROMAX) 500 mg in sodium chloride 0.9 % 250 mL IVPB        500 mg 250 mL/hr over 60 Minutes Intravenous  Once 01/02/22 0801 01/02/22 0925       Subjective: Aleshia Hildenbrandt today has no fevers, no emesis,  No chest pain,   -Cough improving -Dyspnea and wheezing also improving -Voiding okay  Objective: Vitals:   01/03/22 0300 01/03/22 1021 01/03/22 1424 01/03/22 1443  BP: (!) 116/59  116/70   Pulse: 70  70   Resp: 20  19   Temp: 98.1 F (36.7 C)  98.4 F (36.9 C)   TempSrc: Oral  Oral   SpO2: 94% (!) 77% 92% 96%  Weight: 100.3 kg     Height:        Intake/Output Summary (Last 24 hours) at 01/03/2022 1539 Last data filed at 01/03/2022 1300 Gross per 24 hour  Intake 1130 ml  Output --  Net 1130 ml   Filed Weights   01/02/22 0651 01/02/22 1612 01/03/22 0300  Weight: 105 kg 100.3 kg 100.3 kg    Physical Exam  Gen:-  Awake Alert, conversational dyspnea improved HEENT:- [place].AT, No sclera icterus Neck-Supple Neck,No JVD,.  Nose- [place] 3L/min Lungs-improving air movement, few scattered wheezes CV- S1, S2 normal, regular  Abd-  +ve B.Sounds, Abd Soft, No tenderness,    Extremity/Skin:- No  edema, pedal pulses present  Psych-affect is appropriate, oriented x3 Neuro-no new focal deficits, no tremors  Data Reviewed: I have personally reviewed following labs and imaging studies  CBC: Recent Labs  Lab 01/02/22 0713  WBC 19.5*  HGB 13.7  HCT 41.6  MCV 91.6  PLT 315   Basic Metabolic Panel: Recent Labs  Lab 01/02/22 0713  NA 139  K 3.7  CL 101  CO2 28  GLUCOSE 148*  BUN 17  CREATININE 0.68  CALCIUM 8.6*   GFR: Estimated Creatinine Clearance: 96.2 mL/min (by C-G formula based on SCr of 0.68 mg/dL). Liver Function Tests: No results for input(s): AST, ALT, ALKPHOS, BILITOT, PROT, ALBUMIN in the last 168 hours. Cardiac Enzymes: No results for input(s): CKTOTAL, CKMB, CKMBINDEX, TROPONINI in the last 168 hours. BNP (last 3 results) No results for input(s): PROBNP in the last 8760 hours. HbA1C: Recent Labs    01/02/22 0713  HGBA1C 6.0*   Sepsis Labs: @LABRCNTIP (procalcitonin:4,lacticidven:4) ) Recent Results (from the past 240 hour(s))  Resp Panel by RT-PCR (Flu A&B, Covid) Nasopharyngeal Swab     Status: None   Collection Time: 01/02/22  7:13 AM   Specimen: Nasopharyngeal Swab; Nasopharyngeal(NP) swabs in vial transport medium  Result Value Ref Range Status   SARS Coronavirus 2 by RT PCR NEGATIVE NEGATIVE Final    Comment: (NOTE) SARS-CoV-2 target nucleic acids are NOT DETECTED.  The SARS-CoV-2 RNA is generally detectable in upper respiratory specimens during the acute phase of infection. The lowest concentration of SARS-CoV-2 viral copies this assay can detect is 138 copies/mL. A negative result does not preclude SARS-Cov-2 infection and should not be used as the sole basis for  treatment or other patient management decisions. A negative result may occur with  improper specimen collection/handling, submission of specimen other than nasopharyngeal swab, presence of viral mutation(s) within the areas targeted by this assay, and inadequate number of viral copies(<138 copies/mL). A negative result must be combined with clinical observations, patient history, and epidemiological information. The expected result is Negative.  Fact Sheet for Patients:  EntrepreneurPulse.com.au  Fact Sheet for Healthcare Providers:  IncredibleEmployment.be  This test is no t yet approved or cleared by the [place] FDA and  has been authorized for detection and/or diagnosis of SARS-CoV-2 by FDA under an Emergency Use Authorization (EUA). This EUA will remain  in effect (meaning this test can be used) for the duration of the COVID-19 declaration under Section 564(b)(1) of the Act, 21 U.S.C.section 360bbb-3(b)(1), unless the authorization is terminated  or revoked sooner.       Influenza A by PCR NEGATIVE NEGATIVE Final   Influenza B by PCR NEGATIVE NEGATIVE Final    Comment: (NOTE) The Xpert Xpress SARS-CoV-2/FLU/RSV plus assay is intended as an aid in the diagnosis of influenza from Nasopharyngeal swab specimens and should not be used as a sole basis for treatment. Nasal washings and aspirates are unacceptable for Xpert Xpress SARS-CoV-2/FLU/RSV testing.  Fact Sheet for Patients: EntrepreneurPulse.com.au  Fact Sheet for Healthcare Providers: IncredibleEmployment.be  This test is not yet approved or cleared by the [place] FDA and has been authorized for detection and/or diagnosis of SARS-CoV-2 by FDA under an Emergency Use Authorization (EUA). This EUA will remain in effect (meaning this test can be used) for the duration of the COVID-19 declaration under Section 564(b)(1) of the Act, 21  U.S.C. section 360bbb-3(b)(1), unless the authorization is terminated or revoked.  Performed at [facility], [address], [place], [place] [address]       Radiology Studies: CT Angio Chest Pulmonary Embolism (PE) W or WO Contrast  Result Date: 01/02/2022 CLINICAL DATA:  COPD and CHF.  Dyspnea.  Cough. EXAM: CT ANGIOGRAPHY CHEST WITH CONTRAST TECHNIQUE: Multidetector CT imaging of the chest was performed using the standard protocol during bolus administration of intravenous contrast. Multiplanar CT image reconstructions and MIPs were obtained to evaluate the vascular anatomy. RADIATION DOSE REDUCTION: This  exam was performed according to the departmental dose-optimization program which includes automated exposure control, adjustment of the mA and/or kV according to patient size and/or use of iterative reconstruction technique. CONTRAST:  34mL OMNIPAQUE IOHEXOL 350 MG/ML SOLN COMPARISON:  Chest x-ray 01/02/2022.  CT chest 03/16/2021. FINDINGS: Cardiovascular: Heart and aorta are normal in size. The main pulmonary artery is enlarged. There is adequate opacification of the pulmonary arteries to the segmental level. There is no evidence for pulmonary embolism. Mediastinum/Nodes: Visualized thyroid gland is within normal limits. There are diffuse nonenlarged mediastinal lymph nodes. There is enlarged right hilar lymph node measuring 14 mm short axis. There are nonenlarged left hilar lymph nodes. Esophagus is within normal limits. Lungs/Pleura: There are diffuse tree-in-bud opacities throughout both lungs. There is no focal lung consolidation, pleural effusion, or pneumothorax. Trachea and central airways are patent. Upper Abdomen: No acute abnormality. Musculoskeletal: No chest wall abnormality. No acute or significant osseous findings. Review of the MIP images confirms the above findings. IMPRESSION: 1. No evidence for pulmonary embolism. 2. Diffuse tree-in-bud opacities throughout both lungs compatible  with infectious/inflammatory process. 3. Right hilar lymphadenopathy may be reactive. Follow-up recommended. 4. Enlarged main pulmonary artery which can be seen with pulmonary artery hypertension. Electronically Signed   By: Ronney Asters M.D.   On: 01/02/2022 16:22   DG Chest Port 1 View  Result Date: 01/02/2022 CLINICAL DATA:  51 year old female with productive cough for 1 week. EXAM: PORTABLE CHEST 1 VIEW COMPARISON:  Chest CT 03/16/2021 and earlier. FINDINGS: Portable AP upright view at 0738 hours. Lung volumes and mediastinal contours remain normal. Visualized tracheal air column is within normal limits. Questionable ongoing mild increased interstitial opacity which could correspond to ongoing interstitial abnormality described on CT last year. But otherwise when allowing for portable technique the lungs are clear. No pneumothorax or pleural effusion. Negative visible bowel gas. No acute osseous abnormality identified. IMPRESSION: Questionable unresolved diffuse pulmonary interstitial process as seen on Chest CT 03/16/2021 (please see that report)). Otherwise negative portable chest. Electronically Signed   By: Genevie Ann M.D.   On: 01/02/2022 07:57   ECHOCARDIOGRAM COMPLETE  Result Date: 01/03/2022    ECHOCARDIOGRAM REPORT   Patient Name:   LANAYSIA ASHABRANNER Date of Exam: 01/03/2022 Medical Rec #:  [id number]      Height:       63.0 in Accession #:    [id number]     Weight:       221.1 lb Date of Birth:  March 16, 1971      BSA:          2.018 m Patient Age:    38 years       BP:           116/59 mmHg Patient Gender: F              HR:           82 bpm. Exam Location:  Inpatient Procedure: 2D Echo Indications:    acute systolic chf  History:        Patient has prior history of Echocardiogram examinations, most                 recent 03/19/2021. COPD, Arrythmias:PVC; Signs/Symptoms:Dyspnea.  Sonographer:    Johny Chess RDCS Referring Phys: [phone] [facility]  Sonographer Comments: Patient is morbidly obese.  Image acquisition challenging due to patient body habitus and Image acquisition challenging due to respiratory motion. IMPRESSIONS  1. Left ventricular ejection fraction, by estimation, is  65 to 70%. The left ventricle has normal function. The left ventricle has no regional wall motion abnormalities. Left ventricular diastolic parameters are indeterminate.  2. Right ventricular systolic function is normal. The right ventricular size is normal. Tricuspid regurgitation signal is inadequate for assessing PA pressure.  3. The mitral valve is normal in structure. Trivial mitral valve regurgitation.  4. The aortic valve was not well visualized. Aortic valve regurgitation is not visualized. No aortic stenosis is present.  5. The inferior vena cava is dilated in size with >50% respiratory variability, suggesting right atrial pressure of 8 mmHg. FINDINGS  Left Ventricle: Left ventricular ejection fraction, by estimation, is 65 to 70%. The left ventricle has normal function. The left ventricle has no regional wall motion abnormalities. The left ventricular internal cavity size was normal in size. There is  no left ventricular hypertrophy. Left ventricular diastolic parameters are indeterminate. Right Ventricle: The right ventricular size is normal. No increase in right ventricular wall thickness. Right ventricular systolic function is normal. Tricuspid regurgitation signal is inadequate for assessing PA pressure. Left Atrium: Left atrial size was normal in size. Right Atrium: Right atrial size was normal in size. Pericardium: There is no evidence of pericardial effusion. Mitral Valve: The mitral valve is normal in structure. Trivial mitral valve regurgitation. Tricuspid Valve: The tricuspid valve is normal in structure. Tricuspid valve regurgitation is trivial. Aortic Valve: The aortic valve was not well visualized. Aortic valve regurgitation is not visualized. No aortic stenosis is present. Pulmonic Valve: The pulmonic valve  was not well visualized. Pulmonic valve regurgitation is not visualized. Aorta: The aortic root is normal in size and structure. Venous: The inferior vena cava is dilated in size with greater than 50% respiratory variability, suggesting right atrial pressure of 8 mmHg. IAS/Shunts: The interatrial septum was not well visualized.  RIGHT VENTRICLE             IVC RV S prime:     12.10 cm/s  IVC diam: 2.60 cm TAPSE (M-mode): 2.2 cm LEFT ATRIUM             Index LA Vol (A2C):   56.7 ml 28.10 ml/m LA Vol (A4C):   43.2 ml 21.41 ml/m LA Biplane Vol: 51.0 ml 25.27 ml/m  AORTIC VALVE LVOT Vmax:   115.00 cm/s LVOT Vmean:  71.100 cm/s LVOT VTI:    0.230 m MITRAL VALVE MV Area (PHT): 6.32 cm     SHUNTS MV Decel Time: 120 msec     Systemic VTI: 0.23 m MV E velocity: 109.00 cm/s MV A velocity: 66.40 cm/s MV E/A ratio:  1.64 Oswaldo Milian MD Electronically signed by Oswaldo Milian MD Signature Date/Time: 01/03/2022/1:29:39 PM    Final      Scheduled Meds:  atorvastatin  20 mg Oral Daily   bisoprolol  5 mg Oral Daily   busPIRone  5 mg Oral TID   dextromethorphan-guaiFENesin  1 tablet Oral BID   enoxaparin (LOVENOX) injection  50 mg Subcutaneous Q24H   furosemide  40 mg Intravenous Daily   insulin aspart  0-5 Units Subcutaneous QHS   insulin aspart  0-6 Units Subcutaneous TID WC   insulin aspart  3 Units Subcutaneous TID WC   ipratropium-albuterol  3 mL Nebulization Q6H   methylPREDNISolone (SOLU-MEDROL) injection  40 mg Intravenous Q12H   mometasone-formoterol  2 puff Inhalation BID   pantoprazole  40 mg Oral Daily   [START ON 01/04/2022] sacubitril-valsartan  1 tablet Oral BID   sertraline  25 mg  Oral Daily   sodium chloride flush  3 mL Intravenous Q12H   sodium chloride flush  3 mL Intravenous Q12H   Continuous Infusions:  sodium chloride     azithromycin 500 mg (01/02/22 1816)   cefTRIAXone (ROCEPHIN)  IV 1 g (01/02/22 1737)    LOS: 0 days   Roxan Hockey M.D on 01/03/2022 at 3:39  PM  Go to [email or web address] - for contact info  [facility] - Office  [phone]  If 7PM-7AM, please contact night-coverage [email or web address] Password [facility] 01/03/2022, 3:39 PM

## 2022-01-04 LAB — CBC
HCT: 39.5 % (ref 36.0–46.0)
Hemoglobin: 12.8 g/dL (ref 12.0–15.0)
MCH: 30.2 pg (ref 26.0–34.0)
MCHC: 32.4 g/dL (ref 30.0–36.0)
MCV: 93.2 fL (ref 80.0–100.0)
Platelets: 353 10*3/uL (ref 150–400)
RBC: 4.24 MIL/uL (ref 3.87–5.11)
RDW: 13.2 % (ref 11.5–15.5)
WBC: 16.1 10*3/uL — ABNORMAL HIGH (ref 4.0–10.5)
nRBC: 0 % (ref 0.0–0.2)

## 2022-01-04 LAB — BASIC METABOLIC PANEL
Anion gap: 7 (ref 5–15)
BUN: 25 mg/dL — ABNORMAL HIGH (ref 6–20)
CO2: 31 mmol/L (ref 22–32)
Calcium: 9 mg/dL (ref 8.9–10.3)
Chloride: 103 mmol/L (ref 98–111)
Creatinine, Ser: 0.73 mg/dL (ref 0.44–1.00)
GFR, Estimated: >60 mL/min (ref >60)
Glucose, Bld: 123 mg/dL — ABNORMAL HIGH (ref 70–99)
Potassium: 4 mmol/L (ref 3.5–5.1)
Sodium: 141 mmol/L (ref 135–145)

## 2022-01-04 LAB — GLUCOSE, CAPILLARY
Glucose-Capillary: 114 mg/dL — ABNORMAL HIGH (ref 70–99)
Glucose-Capillary: 164 mg/dL — ABNORMAL HIGH (ref 70–99)

## 2022-01-04 NOTE — Progress Notes (Signed)
Dr. Marisa Severin spoke with patient earlier in shift about seeing pulmonology , patient was educated on the benefits of staying vs leaving AMA , she states she was hurting and patient was diaphoretic, gave patient PRN tylenol and heat packs for her back , about 10 mins later patient told NT Chasity , Patient states she does not want to stay and wanted her dishcarge papers, Dr. Marisa Severin made aware, patient had removed her own IV , AMA papers signed and placed in patients medical chart,

## 2022-01-04 NOTE — Plan of Care (Signed)

## 2022-01-04 NOTE — Discharge Summary (Addendum)
Holly KocherSusan R Fuentes, is a 51 y.o. female  DOB 12/23/1970  MRN [id number].  Admission date:  01/02/2022  Admitting Physician  Shon Haleourage Bardia Wangerin, MD  Discharge Date:  01/04/2022   Primary MD  Waldon ReiningBrowning, Douglas, MD  Recommendations for primary care physician for things to follow:   -Patient left AMA  Admission Diagnosis  COPD exacerbation (HCC) [J44.1] COPD with acute exacerbation (HCC) [J44.1]  Discharge Diagnosis  COPD exacerbation (HCC) [J44.1] COPD with acute exacerbation (HCC) [J44.1]    Principal Problem:   COPD with acute exacerbation (HCC) Active Problems:   Obesity (BMI 30-39.9)   Leukocytosis   Anxiety   Depression      Past Medical History:  Diagnosis Date   Abnormal chest CT    Anxiety    CHF (congestive heart failure) (HCC)    COPD (chronic obstructive pulmonary disease) (HCC)    Depression    HFrEF (heart failure with reduced ejection fraction) (HCC)    a.EF 35-40% by echo in 03/2021   HLD (hyperlipidemia)    Hypertension    PTSD (post-traumatic stress disorder)    Tobacco abuse     Past Surgical History:  Procedure Laterality Date   BACK SURGERY     CESAREAN SECTION       HPI  from the history and physical done on the day of admission:   Holly BangSusan Ripley  is a 51 y.o. female reformed smoker (quit in 2022) with past medical history relevant for  COPD, systolic dysfunction CHF/cardiomyopathy with EF of 35 to 40% , depression/anxiety and PTSD and obesity presents to the ED with a 3-day history of cough, chills, worsening shortness of breath -Denies chest pains palpitations dizziness no leg pains no pleuritic symptoms -In the ED patient required high flow oxygen at 10 L -After interventions she has been weaned down to about 4 to 5 L of oxygen at this time -Previously was not on O2 at home -Procalcitonin 0.31, COVID and flu negative -BNP somewhat elevated at 233 -WBCs 19.5 Hgb 13.7  and platelets 315 -CTA chest shows Diffuse tree-in-bud opacities throughout both lungs compatible with infectious/inflammatory process. -Right hilar lymphadenopathy may be reactive. Follow-up recommended. -Enlarged main pulmonary artery which can be seen with pulmonary artery hypertension.     Hospital Course:    Brief Narrative:  51 y.o. female reformed smoker (quit in 2022) with past medical history relevant for  COPD, systolic dysfunction CHF / cardiomyopathy, depression/anxiety and PTSD and obesity admitted on 01/02/2022 with acute COPD exacerbation leading to acute hypoxic respiratory failure     -Assessment and Plan: 1)Acute COPD Exacerbation--patient is a reformed smoker -Treated with IV Solu-Medrol, -Treated with Rocephin and azithromycin, bronchodilators and mucolytics -CTA chest with possible infection -Pulmonology consult requested, Dr. Cyril Mourningakesh Alva agreed to see patient in the hospital -Patient was made aware that pulmonologist was on his way to see her -However, Patient stated that she needed pain medications and she can get pain medications at home from friends and relatives--- she apparently has DDD/chronic back pain but  she is not on legally prescribed opiates -Patient admits that she does take opiates from other people to use -Patient was restless, anxious, sweaty--- there was concern that patient may be withdrawing from opiates -Patient states that she sweats all the time -Patient declined to wait for pulmonologist to see her in consultation today -She left AMA despite repeated persuasion to stay for further pulmonology evaluation -Patient was ambulated , post ambulation O2 sats 92 to 94% on room air   2)Abnormal CT chest/pulmonary infection --- -CTA chest shows Diffuse tree-in-bud opacities throughout both lungs compatible with infectious / inflammatory process. -Right hilar lymphadenopathy may be reactive. -Elevated procalcitonin and leukocytosis noted -COVID and flu  negative -Antibiotics as above #1 -Left AMA , please see discussion above regarding attempt to get pulmonology to see patient   3)HFrEF-h/o chronic systolic dysfunction CHF/cardiomyopathy-- -Prior echo from August 2022 with EF of 35 to 40% -Repeat echo from 01/03/2022 with EF up to 65 to 70% -CTA chest on 01/02/2022 shows  --Enlarged main pulmonary artery which can be seen with pulmonary artery hypertension (unable to assess PA pressure on echo from 01/03/2022) -BNP somewhat elevated at 233 -Treated with IV Lasix -Okay to restart Entresto -Consider switching Toprol-XL to bisoprolol due to pulmonary/bronchial spasm concerns -Patient left AMA  4)Acute hypoxic respiratory failure In the ED patient required high flow oxygen at 10 L --Continue to wean down O2 -Previously was not on O2 at home -Hypoxia resolved, please see discussion above #1   5)Depression/anxiety and PTSD--- stable, continue Zoloft and BuSpar   6) class II obesity- -Low calorie diet, portion control and increase physical activity discussed with patient -Body mass index is 39.17 kg/m.   7) hyperglycemia---- A1c was 6.0 reflecting excellent glycemic control PTA - 8)Social/Ethics-----patient left AMA despite persuasion to the contrary -Pulmonology consult requested, Dr. Cyril Mourning agreed to see patient in the hospital -Patient was made aware that pulmonologist was on his way to see her -However, Patient stated that she needed pain medications and she can get pain medications at home from friends and relatives--- she apparently has DDD/chronic back pain but she is not on legally prescribed opiates -Patient admits that she does take opiates from other people to use -Patient was restless, anxious, sweaty--- there was concern that patient may be withdrawing from opiates -Patient states that she sweats all the time -Patient declined to wait for pulmonologist to see her in consultation today -She left AMA despite repeated  persuasion to stay for further pulmonology evaluation   Disposition--she left AMA Dispo: The patient is from: Home              Anticipated d/c is to: Home                Code Status :  -  Code Status: Full Code    Family Communication:    (patient is alert, awake and coherent)     Discharge Condition: Patient left AMA  Follow UP--- PCP and pulmonologist  Diet and Activity recommendation:  As advised  Discharge Instructions   -Outpatient follow-up with PCP and pulmonologist advised    Discharge Medications     Patient left AMA  Major procedures and Radiology Reports - PLEASE review detailed and final reports for all details, in brief -   CT Angio Chest Pulmonary Embolism (PE) W or WO Contrast  Result Date: 01/02/2022 CLINICAL DATA:  COPD and CHF.  Dyspnea.  Cough. EXAM: CT ANGIOGRAPHY CHEST WITH CONTRAST TECHNIQUE: Multidetector CT imaging of  the chest was performed using the standard protocol during bolus administration of intravenous contrast. Multiplanar CT image reconstructions and MIPs were obtained to evaluate the vascular anatomy. RADIATION DOSE REDUCTION: This exam was performed according to the departmental dose-optimization program which includes automated exposure control, adjustment of the mA and/or kV according to patient size and/or use of iterative reconstruction technique. CONTRAST:  75mL OMNIPAQUE IOHEXOL 350 MG/ML SOLN COMPARISON:  Chest x-ray 01/02/2022.  CT chest 03/16/2021. FINDINGS: Cardiovascular: Heart and aorta are normal in size. The main pulmonary artery is enlarged. There is adequate opacification of the pulmonary arteries to the segmental level. There is no evidence for pulmonary embolism. Mediastinum/Nodes: Visualized thyroid gland is within normal limits. There are diffuse nonenlarged mediastinal lymph nodes. There is enlarged right hilar lymph node measuring 14 mm short axis. There are nonenlarged left hilar lymph nodes. Esophagus is within normal limits.  Lungs/Pleura: There are diffuse tree-in-bud opacities throughout both lungs. There is no focal lung consolidation, pleural effusion, or pneumothorax. Trachea and central airways are patent. Upper Abdomen: No acute abnormality. Musculoskeletal: No chest wall abnormality. No acute or significant osseous findings. Review of the MIP images confirms the above findings. IMPRESSION: 1. No evidence for pulmonary embolism. 2. Diffuse tree-in-bud opacities throughout both lungs compatible with infectious/inflammatory process. 3. Right hilar lymphadenopathy may be reactive. Follow-up recommended. 4. Enlarged main pulmonary artery which can be seen with pulmonary artery hypertension. Electronically Signed   By: Darliss Cheney M.D.   On: 01/02/2022 16:22   DG Chest Port 1 View  Result Date: 01/02/2022 CLINICAL DATA:  51 year old female with productive cough for 1 week. EXAM: PORTABLE CHEST 1 VIEW COMPARISON:  Chest CT 03/16/2021 and earlier. FINDINGS: Portable AP upright view at 0738 hours. Lung volumes and mediastinal contours remain normal. Visualized tracheal air column is within normal limits. Questionable ongoing mild increased interstitial opacity which could correspond to ongoing interstitial abnormality described on CT last year. But otherwise when allowing for portable technique the lungs are clear. No pneumothorax or pleural effusion. Negative visible bowel gas. No acute osseous abnormality identified. IMPRESSION: Questionable unresolved diffuse pulmonary interstitial process as seen on Chest CT 03/16/2021 (please see that report)). Otherwise negative portable chest. Electronically Signed   By: Odessa Fleming M.D.   On: 01/02/2022 07:57   ECHOCARDIOGRAM COMPLETE  Result Date: 01/03/2022    ECHOCARDIOGRAM REPORT   Patient Name:   Holly Fuentes Date of Exam: 01/03/2022 Medical Rec #:  [id number]      Height:       63.0 in Accession #:    [id number]     Weight:       221.1 lb Date of Birth:  Jan 10, 1971      BSA:           2.018 m Patient Age:    51 years       BP:           116/59 mmHg Patient Gender: F              HR:           82 bpm. Exam Location:  Inpatient Procedure: 2D Echo Indications:    acute systolic chf  History:        Patient has prior history of Echocardiogram examinations, most                 recent 03/19/2021. COPD, Arrythmias:PVC; Signs/Symptoms:Dyspnea.  Sonographer:    Delcie Roch RDCS Referring Phys: [phone] [facility]  Sonographer  Comments: Patient is morbidly obese. Image acquisition challenging due to patient body habitus and Image acquisition challenging due to respiratory motion. IMPRESSIONS  1. Left ventricular ejection fraction, by estimation, is 65 to 70%. The left ventricle has normal function. The left ventricle has no regional wall motion abnormalities. Left ventricular diastolic parameters are indeterminate.  2. Right ventricular systolic function is normal. The right ventricular size is normal. Tricuspid regurgitation signal is inadequate for assessing PA pressure.  3. The mitral valve is normal in structure. Trivial mitral valve regurgitation.  4. The aortic valve was not well visualized. Aortic valve regurgitation is not visualized. No aortic stenosis is present.  5. The inferior vena cava is dilated in size with >50% respiratory variability, suggesting right atrial pressure of 8 mmHg. FINDINGS  Left Ventricle: Left ventricular ejection fraction, by estimation, is 65 to 70%. The left ventricle has normal function. The left ventricle has no regional wall motion abnormalities. The left ventricular internal cavity size was normal in size. There is  no left ventricular hypertrophy. Left ventricular diastolic parameters are indeterminate. Right Ventricle: The right ventricular size is normal. No increase in right ventricular wall thickness. Right ventricular systolic function is normal. Tricuspid regurgitation signal is inadequate for assessing PA pressure. Left Atrium: Left atrial size was  normal in size. Right Atrium: Right atrial size was normal in size. Pericardium: There is no evidence of pericardial effusion. Mitral Valve: The mitral valve is normal in structure. Trivial mitral valve regurgitation. Tricuspid Valve: The tricuspid valve is normal in structure. Tricuspid valve regurgitation is trivial. Aortic Valve: The aortic valve was not well visualized. Aortic valve regurgitation is not visualized. No aortic stenosis is present. Pulmonic Valve: The pulmonic valve was not well visualized. Pulmonic valve regurgitation is not visualized. Aorta: The aortic root is normal in size and structure. Venous: The inferior vena cava is dilated in size with greater than 50% respiratory variability, suggesting right atrial pressure of 8 mmHg. IAS/Shunts: The interatrial septum was not well visualized.  RIGHT VENTRICLE             IVC RV S prime:     12.10 cm/s  IVC diam: 2.60 cm TAPSE (M-mode): 2.2 cm LEFT ATRIUM             Index LA Vol (A2C):   56.7 ml 28.10 ml/m LA Vol (A4C):   43.2 ml 21.41 ml/m LA Biplane Vol: 51.0 ml 25.27 ml/m  AORTIC VALVE LVOT Vmax:   115.00 cm/s LVOT Vmean:  71.100 cm/s LVOT VTI:    0.230 m MITRAL VALVE MV Area (PHT): 6.32 cm     SHUNTS MV Decel Time: 120 msec     Systemic VTI: 0.23 m MV E velocity: 109.00 cm/s MV A velocity: 66.40 cm/s MV E/A ratio:  1.64 Epifanio Lesches MD Electronically signed by Epifanio Lesches MD Signature Date/Time: 01/03/2022/1:29:39 PM    Final     Micro Results   Recent Results (from the past 240 hour(s))  Resp Panel by RT-PCR (Flu A&B, Covid) Nasopharyngeal Swab     Status: None   Collection Time: 01/02/22  7:13 AM   Specimen: Nasopharyngeal Swab; Nasopharyngeal(NP) swabs in vial transport medium  Result Value Ref Range Status   SARS Coronavirus 2 by RT PCR NEGATIVE NEGATIVE Final    Comment: (NOTE) SARS-CoV-2 target nucleic acids are NOT DETECTED.  The SARS-CoV-2 RNA is generally detectable in upper respiratory specimens during  the acute phase of infection. The lowest concentration of SARS-CoV-2 viral copies this  assay can detect is 138 copies/mL. A negative result does not preclude SARS-Cov-2 infection and should not be used as the sole basis for treatment or other patient management decisions. A negative result may occur with  improper specimen collection/handling, submission of specimen other than nasopharyngeal swab, presence of viral mutation(s) within the areas targeted by this assay, and inadequate number of viral copies(<138 copies/mL). A negative result must be combined with clinical observations, patient history, and epidemiological information. The expected result is Negative.  Fact Sheet for Patients:  BloggerCourse.com  Fact Sheet for Healthcare Providers:  SeriousBroker.it  This test is no t yet approved or cleared by the [place] FDA and  has been authorized for detection and/or diagnosis of SARS-CoV-2 by FDA under an Emergency Use Authorization (EUA). This EUA will remain  in effect (meaning this test can be used) for the duration of the COVID-19 declaration under Section 564(b)(1) of the Act, 21 U.S.C.section 360bbb-3(b)(1), unless the authorization is terminated  or revoked sooner.       Influenza A by PCR NEGATIVE NEGATIVE Final   Influenza B by PCR NEGATIVE NEGATIVE Final    Comment: (NOTE) The Xpert Xpress SARS-CoV-2/FLU/RSV plus assay is intended as an aid in the diagnosis of influenza from Nasopharyngeal swab specimens and should not be used as a sole basis for treatment. Nasal washings and aspirates are unacceptable for Xpert Xpress SARS-CoV-2/FLU/RSV testing.  Fact Sheet for Patients: BloggerCourse.com  Fact Sheet for Healthcare Providers: SeriousBroker.it  This test is not yet approved or cleared by the [place] FDA and has been authorized for detection and/or  diagnosis of SARS-CoV-2 by FDA under an Emergency Use Authorization (EUA). This EUA will remain in effect (meaning this test can be used) for the duration of the COVID-19 declaration under Section 564(b)(1) of the Act, 21 U.S.C. section 360bbb-3(b)(1), unless the authorization is terminated or revoked.  Performed at [facility], [address] [place], [place] [address]     Today   Subjective    Holly Fuentes today has no new complaints  -Shortness of breath much better, patient ambulated in hallway O2 sats post ambulation 92 to 94% on room air - Patient left AMA despite persuasion to the contrary          Patient has been seen and examined prior to discharge   Objective   Blood pressure 127/61, pulse 75, temperature 97.6 F (36.4 C), temperature source Axillary, resp. rate 20, height  (1.6 m), weight 100.6 kg, last menstrual period 01/18/2016, SpO2 98 %.   Intake/Output Summary (Last 24 hours) at 01/04/2022 1243 Last data filed at 01/04/2022 1000 Gross per 24 hour  Intake 1436 ml  Output 300 ml  Net 1136 ml    Exam Gen:- Awake Alert, no acute distress , no conversational dyspnea, restless and sweaty HEENT:- [place].AT, No sclera icterus Neck-Supple Neck,No JVD,.  Lungs-improved air movement, no wheezing  CV- S1, S2 normal, regular Abd-  +ve B.Sounds, Abd Soft, No tenderness,    Extremity/Skin:- No  edema,   good pulses Psych-affect is anxious, restless and sweaty, oriented x3 Neuro-no new focal deficits, no tremors    Data Review   CBC w Diff:  Lab Results  Component Value Date   WBC 16.1 (H) 01/04/2022   HGB 12.8 01/04/2022   HCT 39.5 01/04/2022   PLT 353 01/04/2022   LYMPHOPCT 18 10/31/2013   MONOPCT 5 10/31/2013   EOSPCT 0 10/31/2013   BASOPCT 0 10/31/2013    CMP:  Lab Results  Component Value Date   NA 141 01/04/2022   K 4.0 01/04/2022   CL 103 01/04/2022   CO2 31 01/04/2022   BUN 25 (H) 01/04/2022   CREATININE 0.73 01/04/2022   PROT 7.9  03/16/2021   ALBUMIN 3.3 (L) 03/16/2021   BILITOT 1.4 (H) 03/16/2021   ALKPHOS 94 03/16/2021   AST 27 03/16/2021   ALT 19 03/16/2021   Total Discharge time is about 33 minutes  Shon Hale M.D on 01/04/2022 at 12:43 PM  Go to [email or web address] -  for contact info  [facility] - Office  [phone]

## 2022-02-22 ENCOUNTER — Telehealth: Payer: Self-pay

## 2022-02-22 NOTE — Telephone Encounter (Signed)
Pt followed up as a result of receiving a renewal enrollment letter as reminder to renew by her expiration date of 8.9.23  [facility] office appt scheduled for 8.2.23 at 3pm   Appt reminder and information sent via text message

## 2022-03-03 ENCOUNTER — Encounter: Payer: Self-pay | Admitting: Student

## 2022-03-03 ENCOUNTER — Ambulatory Visit (INDEPENDENT_AMBULATORY_CARE_PROVIDER_SITE_OTHER): Payer: Self-pay | Admitting: Student

## 2022-03-03 VITALS — BP 118/80 | HR 65 | Ht 63.0 in | Wt 222.2 lb

## 2022-03-03 DIAGNOSIS — R9389 Abnormal findings on diagnostic imaging of other specified body structures: Secondary | ICD-10-CM

## 2022-03-03 DIAGNOSIS — I10 Essential (primary) hypertension: Secondary | ICD-10-CM

## 2022-03-03 DIAGNOSIS — I5032 Chronic diastolic (congestive) heart failure: Secondary | ICD-10-CM

## 2022-03-03 MED ORDER — FUROSEMIDE 20 MG PO TABS: 20.0000 mg | ORAL_TABLET | Freq: Every day | ORAL | 3 refills | Status: AC

## 2022-03-03 NOTE — Patient Instructions (Signed)
Medication Instructions:  Your physician recommends that you continue on your current medications as directed. Please refer to the Current Medication list given to you today.   Labwork: None today  Testing/Procedures: None today  Follow-Up: 5-6 months with Dr.Branch  Any Other Special Instructions Will Be Listed Below (If Applicable).  If you need a refill on your cardiac medications before your next appointment, please call your pharmacy.

## 2022-03-03 NOTE — Progress Notes (Signed)
Cardiology Office Note    Date:  03/03/2022   ID:  Holly Fuentes, DOB 01/01/1971, MRN [id number]  PCP:  Waldon Reining, MD  Cardiologist: Dina Rich, MD    Chief Complaint  Patient presents with   Follow-up    5 month visit    History of Present Illness:    Holly Fuentes is a 51 y.o. female with past medical history of anxiety, depression, tobacco use and HFrEF (EF 35-40% by echo in 03/2021) who presents to the office today for overdue follow-up.  She was examined by myself in 09/2021 and reported overall feeling well at that time and denied any recent respiratory issues. She was continued on Lasix and Toprol-XL with Losartan being transitioned to [facility] with plans to apply for patient assistance to help cover the medication. Was recommended to add Spironolactone or an SGLT2 inhibitor next.  In the interim, she was admitted to [facility] in 12/2021 for an acute COPD exacerbation. Repeat echocardiogram that admission showed her EF had normalized to 65 to 70%.  It appears she did leave AMA prior to being evaluated by Pulmonology.  In talking with the patient today, she reports her respiratory status has been stable since hospitalization in 12/2021. She denies any recent orthopnea, PND or pitting edema. No recent chest pain or palpitations. She reports good compliance with her current medication regimen and was approved for patient assistance for Entresto in the interim. She remains on Lasix 20 mg daily and reports only having to take an extra tablet 1-2x per month.   Past Medical History:  Diagnosis Date   Abnormal chest CT    Anxiety    CHF (congestive heart failure) (HCC)    COPD (chronic obstructive pulmonary disease) (HCC)    Depression    HFrEF (heart failure with reduced ejection fraction) (HCC)    a.EF 35-40% by echo in 03/2021 b. echo in 12/2021 showing EF had improved to 65-70%   HLD (hyperlipidemia)    Hypertension    PTSD (post-traumatic stress disorder)     Tobacco abuse     Past Surgical History:  Procedure Laterality Date   BACK SURGERY     CESAREAN SECTION      Current Medications: Outpatient Medications Prior to Visit  Medication Sig Dispense Refill   albuterol (VENTOLIN HFA) 108 (90 Base) MCG/ACT inhaler Inhale 2 puffs into the lungs every 6 (six) hours as needed for wheezing or shortness of breath. 8 g 2   albuterol (VENTOLIN HFA) 108 (90 Base) MCG/ACT inhaler Inhale 1 puff into the lungs daily as needed for shortness of breath or wheezing.     atorvastatin (LIPITOR) 20 MG tablet Take 20 mg by mouth daily.     budesonide-formoterol (SYMBICORT) 80-4.5 MCG/ACT inhaler Inhale 2 puffs into the lungs in the morning and at bedtime. 1 each 12   metoprolol tartrate (LOPRESSOR) 25 MG tablet Take 25 mg by mouth 2 (two) times daily.     sacubitril-valsartan (ENTRESTO) 24-26 MG Take 1 tablet by mouth 2 (two) times daily. 180 tablet 3   sertraline (ZOLOFT) 25 MG tablet Take 25 mg by mouth daily.     furosemide (LASIX) 20 MG tablet Take 1 tablet (20 mg total) by mouth daily. 30 tablet 11   pantoprazole (PROTONIX) 40 MG tablet Take 1 tablet (40 mg total) by mouth daily. (Patient not taking: Reported on 01/02/2022) 30 tablet 0   No facility-administered medications prior to visit.     Allergies:  Cranberry and Hydrocodone-acetaminophen   Social History   Socioeconomic History   Marital status: Single    Spouse name: Not on file   Number of children: Not on file   Years of education: Not on file   Highest education level: Not on file  Occupational History   Not on file  Tobacco Use   Smoking status: Former    Packs/day: 0.50    Types: Cigarettes    Quit date: 11/14/2020    Years since quitting: 1.2   Smokeless tobacco: Never  Vaping Use   Vaping Use: Never used  Substance and Sexual Activity   Alcohol use: No   Drug use: No    Types: Other-see comments    Comment: Pills- states she will be clean x1 year in Feb 2013   Sexual  activity: Yes    Birth control/protection: None, Abstinence  Other Topics Concern   Not on file  Social History Narrative   Not on file   Social Determinants of Health   Financial Resource Strain: Not on file  Food Insecurity: Not on file  Transportation Needs: Not on file  Physical Activity: Not on file  Stress: Not on file  Social Connections: Not on file     Family History:  The patient's family history includes Cancer in her maternal grandmother; Heart disease in her maternal grandfather and maternal grandmother; Hypertension in her mother.   Review of Systems:    Please see the history of present illness.     All other systems reviewed and are otherwise negative except as noted above.   Physical Exam:    VS:  BP 118/80   Pulse 65   Ht 5\' 3"  (1.6 m)   Wt 222 lb 3.2 oz (100.8 kg)   LMP 01/18/2016 (Approximate)   SpO2 96%   BMI 39.36 kg/m    General: Well developed, well nourished,female appearing in no acute distress. Head: Normocephalic, atraumatic. Neck: No carotid bruits. JVD not elevated.  Lungs: Respirations regular and unlabored, without wheezes or rales.  Heart: Regular rate and rhythm. No S3 or S4.  No murmur, no rubs, or gallops appreciated. Abdomen: Appears non-distended. No obvious abdominal masses. Msk:  Strength and tone appear normal for age. No obvious joint deformities or effusions. Extremities: No clubbing or cyanosis. No pitting edema.  Distal pedal pulses are 2+ bilaterally. Neuro: Alert and oriented X 3. Moves all extremities spontaneously. No focal deficits noted. Psych:  Responds to questions appropriately with a normal affect. Skin: No rashes or lesions noted  Wt Readings from Last 3 Encounters:  03/03/22 222 lb 3.2 oz (100.8 kg)  01/04/22 221 lb 12.8 oz (100.6 kg)  09/24/21 229 lb 6.4 oz (104.1 kg)     Studies/Labs Reviewed:   EKG:  EKG is not ordered today.   Recent Labs: 03/16/2021: ALT 19; TSH 0.486 03/18/2021: Magnesium  2.1 01/02/2022: B Natriuretic Peptide 233.0 01/04/2022: BUN 25; Creatinine, Ser 0.73; Hemoglobin 12.8; Platelets 353; Potassium 4.0; Sodium 141   Lipid Panel No results found for: "CHOL", "TRIG", "HDL", "CHOLHDL", "VLDL", "LDLCALC", "LDLDIRECT"  Additional studies/ records that were reviewed today include:   Echocardiogram: 01/03/2022 IMPRESSIONS     1. Left ventricular ejection fraction, by estimation, is 65 to 70%. The  left ventricle has normal function. The left ventricle has no regional  wall motion abnormalities. Left ventricular diastolic parameters are  indeterminate.   2. Right ventricular systolic function is normal. The right ventricular  size is normal. Tricuspid regurgitation signal  is inadequate for assessing  PA pressure.   3. The mitral valve is normal in structure. Trivial mitral valve  regurgitation.   4. The aortic valve was not well visualized. Aortic valve regurgitation  is not visualized. No aortic stenosis is present.   5. The inferior vena cava is dilated in size with >50% respiratory  variability, suggesting right atrial pressure of 8 mmHg.   Assessment:    1. Chronic heart failure with preserved ejection fraction (HCC)   2. Essential hypertension   3. Abnormal chest CT      Plan:   In order of problems listed above:  1. HFimpEF - Her ejection fraction was previously reduced at 35 to 40% in 03/2021, normalized by repeat imaging and at 65 to 70% in 12/2021. - She denies any recent orthopnea, PND or pitting edema and appears euvolemic by examination today. Continue current medical therapy with Entresto 24-26 mg twice daily, Lasix 20 mg daily and Lopressor 25 mg twice daily. Labs in 12/2021 showed her creatinine was stable at 0.73.  2. HTN - Her BP is well controlled at 118/80 during today's visit. Continue current medical therapy.  3. Abnormal Chest CT - CT imaging in 03/2021 showed numerous ill-defined groundglass nodules within the upper and lower  lobes. She did have repeat imaging in 12/2021 which showed diffuse tree-in-bud opacities throughout both lungs compatible with infectious/inflammatory process and right hilar lymphadenopathy which may be reactive.  She does have upcoming follow-up with Pulmonology later this month.    Medication Adjustments/Labs and Tests Ordered: Current medicines are reviewed at length with the patient today.  Concerns regarding medicines are outlined above.  Medication changes, Labs and Tests ordered today are listed in the Patient Instructions below. Patient Instructions  Medication Instructions:  Your physician recommends that you continue on your current medications as directed. Please refer to the Current Medication list given to you today.   Labwork: None today  Testing/Procedures: None today  Follow-Up: 5-6 months with Dr.Branch  Any Other Special Instructions Will Be Listed Below (If Applicable).  If you need a refill on your cardiac medications before your next appointment, please call your pharmacy.    Signed, Ellsworth Lennox, PA-C  03/03/2022 5:22 PM    [facility] [address] [place], [place] [address] Phone: [phone] Fax: [phone]

## 2022-03-08 ENCOUNTER — Encounter: Payer: Self-pay | Admitting: Pulmonary Disease

## 2022-03-08 ENCOUNTER — Ambulatory Visit (INDEPENDENT_AMBULATORY_CARE_PROVIDER_SITE_OTHER): Payer: Self-pay | Admitting: Pulmonary Disease

## 2022-03-08 DIAGNOSIS — J441 Chronic obstructive pulmonary disease with (acute) exacerbation: Secondary | ICD-10-CM

## 2022-03-08 DIAGNOSIS — R9389 Abnormal findings on diagnostic imaging of other specified body structures: Secondary | ICD-10-CM

## 2022-03-08 NOTE — Progress Notes (Signed)
Subjective:    Patient ID: Holly Fuentes, female    DOB: 04/27/1971, 51 y.o.   MRN: [id number]  HPI  Chief Complaint  Patient presents with   Consult    Ref by PCP after [facility] admission AP for 5/20-5/22 for copd exacerbation.    51 year old ex-smoker referred for evaluation of abnormal imaging and shortness of breath She smoked half pack per day starting at age 71 until she quit a year ago, about 20 pack years  PMH - HFrEF -35-40%  03/2021 , recovered to normal on echo 12/2021 -Opiate use disorder , obtains opiates from others -Degenerative disc disease, chronic back pain -Bilateral hip pain, "bone-on-bone"  She was hospitalized 12/2021 for shortness of breath, cough and fevers, leukocytosis. CTA chest showed diffuse tree-in-bud opacities throughout both lungs with an enlarged pulmonary artery and right hilar lymph node She was treated for COPD exacerbation with IV Solu-Medrol and bronchodilators and Rocephin/azithromycin combination and for CHF with IV Lasix oxygen was weaned off.  She signed out AMA due to chronic back pain for which she needed opiates  I have reviewed CHF consultation.  She is now on Entresto and Lasix as needed.  Breathing is much improved.  She reports shortness of breath while walking back from the mailbox.  She denies pedal edema.  She reports an occasional cough which she reports is a tickle in her throat  Accompanied by her mom today who corroborates history  Significant tests/ events reviewed  Amb sat 02/2022 dropped to 90% on ambulation with shortness of breath CTA 12/2021 diffuse bilateral tree-in-bud opacities.  CT chest without con 03/2021 numerous groundglass nodules bilateral , septal thickening -DDF respiratory bronchiolitis   Past Medical History:  Diagnosis Date   Abnormal chest CT    Anxiety    CHF (congestive heart failure) (HCC)    COPD (chronic obstructive pulmonary disease) (HCC)    Depression    HFrEF (heart failure with reduced ejection  fraction) (HCC)    a.EF 35-40% by echo in 03/2021 b. echo in 12/2021 showing EF had improved to 65-70%   HLD (hyperlipidemia)    Hypertension    PTSD (post-traumatic stress disorder)    Tobacco abuse    Past Surgical History:  Procedure Laterality Date   BACK SURGERY     CESAREAN SECTION       Allergies  Allergen Reactions   Cranberry Hives   Hydrocodone-Acetaminophen Other (See Comments)    Reaction : migraines    Social History   Socioeconomic History   Marital status: Single    Spouse name: Not on file   Number of children: Not on file   Years of education: Not on file   Highest education level: Not on file  Occupational History   Not on file  Tobacco Use   Smoking status: Former    Packs/day: 0.50    Types: Cigarettes    Quit date: 11/14/2020    Years since quitting: 1.3   Smokeless tobacco: Never  Vaping Use   Vaping Use: Never used  Substance and Sexual Activity   Alcohol use: No   Drug use: No    Types: Other-see comments    Comment: Pills- states she will be clean x1 year in Feb 2013   Sexual activity: Yes    Birth control/protection: None, Abstinence  Other Topics Concern   Not on file  Social History Narrative   Not on file   Social Determinants of Health   Financial Resource Strain:  Not on file  Food Insecurity: Not on file  Transportation Needs: Not on file  Physical Activity: Not on file  Stress: Not on file  Social Connections: Not on file  Intimate Partner Violence: Not on file    Family History  Problem Relation Age of Onset   Hypertension Mother    Cancer Maternal Grandmother    Heart disease Maternal Grandmother    Heart disease Maternal Grandfather     Review of Systems   Constitutional: negative for anorexia, fevers and sweats  Eyes: negative for irritation, redness and visual disturbance  Ears, nose, mouth, throat, and face: negative for earaches, epistaxis, nasal congestion and sore throat  Respiratory: negative for  cough, dyspnea on exertion, sputum and wheezing  Cardiovascular: negative for chest pain, dyspnea, lower extremity edema, orthopnea, palpitations and syncope  Gastrointestinal: negative for abdominal pain, constipation, diarrhea, melena, nausea and vomiting  Genitourinary:negative for dysuria, frequency and hematuria  Hematologic/lymphatic: negative for bleeding, easy bruising and lymphadenopathy  Musculoskeletal:negative for arthralgias, muscle weakness and stiff joints  Neurological: negative for coordination problems, gait problems, headaches and weakness  Endocrine: negative for diabetic symptoms including polydipsia, polyuria and weight loss     Objective:   Physical Exam  Gen. Pleasant, obese, in no distress, normal affect ENT - no pallor,icterus, no post nasal drip, class 2 airway Neck: No JVD, no thyromegaly, no carotid bruits Lungs: no use of accessory muscles, no dullness to percussion, decreased without rales or rhonchi  Cardiovascular: Rhythm regular, heart sounds  normal, no murmurs or gallops, no peripheral edema Abdomen: soft and non-tender, no hepatosplenomegaly, BS normal. Musculoskeletal: No deformities, no cyanosis or clubbing Neuro:  alert, non focal, no tremors       Assessment & Plan:

## 2022-03-08 NOTE — Assessment & Plan Note (Signed)
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  Chronic infection less likely this does not appear to be mycobacterial infection.  Other possibility would be inflammatory etiology, she has not been diagnosed with RA or other collagen vascular disease.  I would recommend a follow-up high-resolution CT chest in November to clarify

## 2022-03-08 NOTE — Patient Instructions (Signed)
  X Amb sat  You have markings in your lungs which could have been infection or inflammation  We can schedule PFTs & CT chest in the future

## 2022-03-08 NOTE — Assessment & Plan Note (Signed)
Since she desaturates with exertion, we should schedule full PFTs to look for obstructive versus restrictive pattern from ILD. I have asked her to continue the Symbicort which she has obtained from family members.  I gave her a sample of Anoro. After she is done with this she can just use albuterol/nebs as needed until we obtain PFTs

## 2022-06-04 ENCOUNTER — Ambulatory Visit: Payer: Self-pay | Admitting: Pulmonary Disease

## 2022-07-09 ENCOUNTER — Ambulatory Visit (HOSPITAL_COMMUNITY): Admission: RE | Admit: 2022-07-09 | Payer: Self-pay | Source: Ambulatory Visit

## 2022-09-06 ENCOUNTER — Ambulatory Visit: Payer: Medicaid Other | Admitting: Pulmonary Disease

## 2022-09-21 ENCOUNTER — Telehealth: Payer: Self-pay | Admitting: Pulmonary Disease

## 2022-09-21 NOTE — Telephone Encounter (Signed)
OV notes faxed.  ATC patient to notify.  Nothing further needed at this time

## 2022-11-22 ENCOUNTER — Telehealth: Payer: Self-pay | Admitting: Student

## 2022-11-22 NOTE — Telephone Encounter (Signed)
Spoke to pt who was called about patient assistance for [employer]. Advised pt that nurse was out of the office today and would return call when she returns.

## 2022-11-22 NOTE — Telephone Encounter (Signed)
Patient states she is returning a call. 

## 2022-11-23 NOTE — Telephone Encounter (Signed)
   Pt said, she doesn't have any income or insurance information. She said, Bradly Bienenstock can call her back

## 2022-11-23 NOTE — Telephone Encounter (Signed)
Returned call to pt regarding [employer] patient assistance. Pt needs to call and give proof of income and insurance information. No answer. Left msg to call back.

## 2022-11-23 NOTE — Telephone Encounter (Signed)
Spoke with pt and informed her that they need to talk with her regarding income and insurance information.

## 2022-11-25 ENCOUNTER — Telehealth: Payer: Self-pay | Admitting: *Deleted

## 2022-11-25 NOTE — Telephone Encounter (Signed)
Called to notify pt that she has been approved for the [employer] patient assistance. She was approved from 11/25/22 until 11/25/23.

## 2023-02-22 ENCOUNTER — Ambulatory Visit: Payer: Medicaid Other | Attending: Student | Admitting: Student

## 2023-02-22 NOTE — Progress Notes (Deleted)
   Cardiology Office Note    Date:  02/22/2023  ID:  Holly Fuentes, DOB 1971/06/19, MRN [id number] Cardiologist: Dina Rich, MD    History of Present Illness:    Holly Fuentes is a 52 y.o. female with past medical history of HFimpEF (EF 35-40% by echo in 03/2021, normalized to 65-70% by repeat echo in 12/2021), anxiety, depression, COPD and tobacco use who presents to the office today for overdue follow-up.  She was examined by myself in 02/2022 and denied any recent dyspnea or anginal symptoms at that time. She was continued on her current medical therapy with Entresto 24-26 mg twice daily, Lasix 20 mg daily and Lopressor 25 mg twice daily.  ROS: ***  Studies Reviewed:   EKG: EKG is*** ordered today and demonstrates ***   EKG Interpretation Date/Time:    Ventricular Rate:    PR Interval:    QRS Duration:    QT Interval:    QTC Calculation:   R Axis:      Text Interpretation:           Risk Assessment/Calculations:   {Does this patient have ATRIAL FIBRILLATION?:[id number]} No BP recorded.  {Refresh Note OR Click here to enter BP  :1}***         Physical Exam:   VS:  LMP 01/18/2016 (Approximate)    Wt Readings from Last 3 Encounters:  03/08/22 221 lb 9.6 oz (100.5 kg)  03/03/22 222 lb 3.2 oz (100.8 kg)  01/04/22 221 lb 12.8 oz (100.6 kg)     GEN: Well nourished, well developed in no acute distress NECK: No JVD; No carotid bruits CARDIAC: ***RRR, no murmurs, rubs, gallops RESPIRATORY:  Clear to auscultation without rales, wheezing or rhonchi  ABDOMEN: Appears non-distended. No obvious abdominal masses. EXTREMITIES: No clubbing or cyanosis. No edema.  Distal pedal pulses are 2+ bilaterally.   Assessment and Plan:   1. HFimpEF - EF was at 35-40% by echo in 03/2021, normalized to 65-70% by repeat echo in 12/2021. ***  2. HTN - ***  3. Abnormal Chest CT - Prior CT in 03/2021 showed numerous groundglass nodules along the upper and lower lobes along  with repeat imaging in 12/2021 showing diffuse tree-in-bud opacities throughout the lungs concerning for infectious/inflammatory process.  She did follow-up with pulmonology in 02/2022 with high-resolution CT chest recommended in 06/2022 but it does not appear this was obtained.     Signed, Ellsworth Lennox, PA-C

## 2023-07-22 IMAGING — CT CT ANGIO CHEST
2 of 8 series · 18 of 46 positions shown · IV contrast (Omnipaque or Isovue)
Comparison: Chest x-ray 01/02/2022.  CT chest 03/16/2021.

CLINICAL DATA: COPD and CHF.  Dyspnea.  Cough.

EXAM:
CT ANGIOGRAPHY CHEST WITH CONTRAST
TECHNIQUE: Multidetector CT imaging of the chest was performed using the
standard protocol during bolus administration of intravenous
contrast. Multiplanar CT image reconstructions and MIPs were
obtained to evaluate the vascular anatomy.

[Series 5: pe axial thins · axial · 0.76mm/px · z∈[+1126,+1400]mm · 15 of 387 slices shown]
[im 22/387  lung]
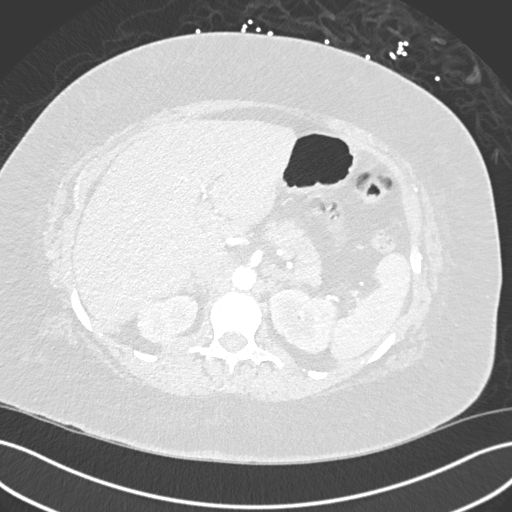
[im 43/387  soft-tissue]
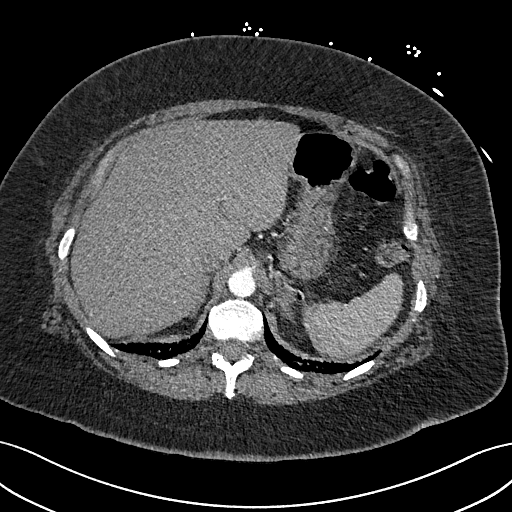
[im 65/387  lung]
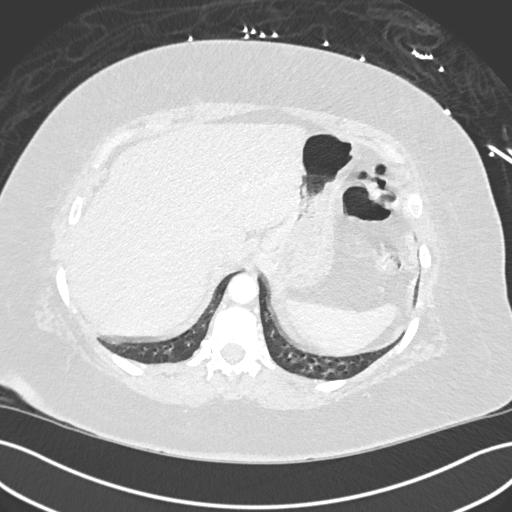
[im 86/387  soft-tissue]
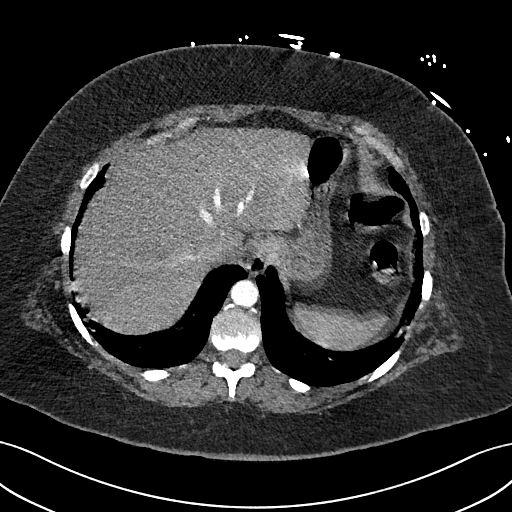
[im 129/387  lung]
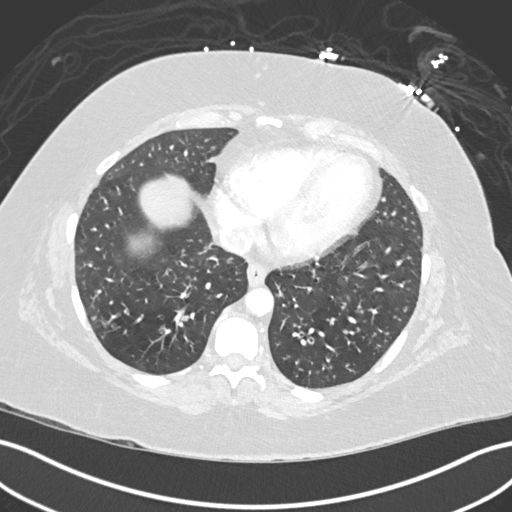
[im 151/387  soft-tissue]
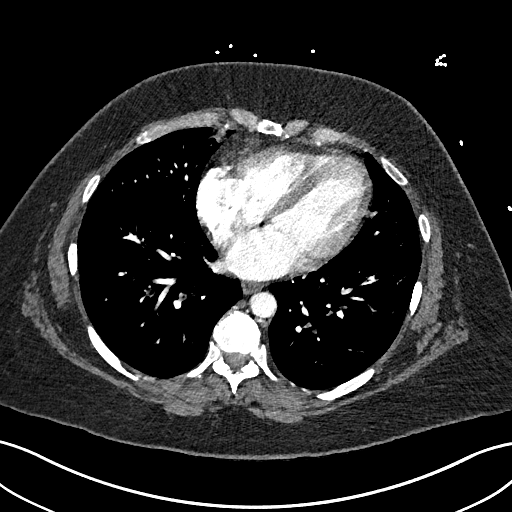
[im 172/387  lung]
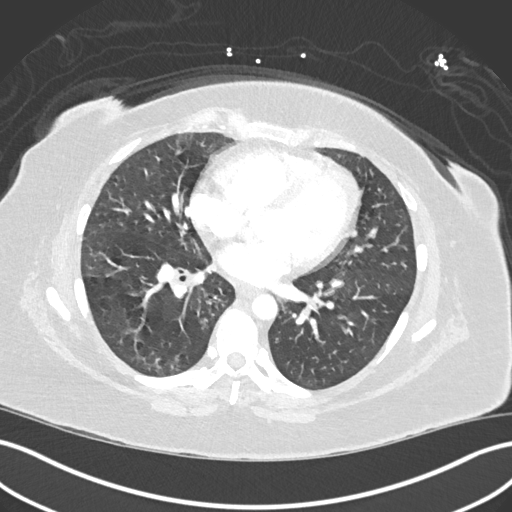
[im 194/387  soft-tissue]
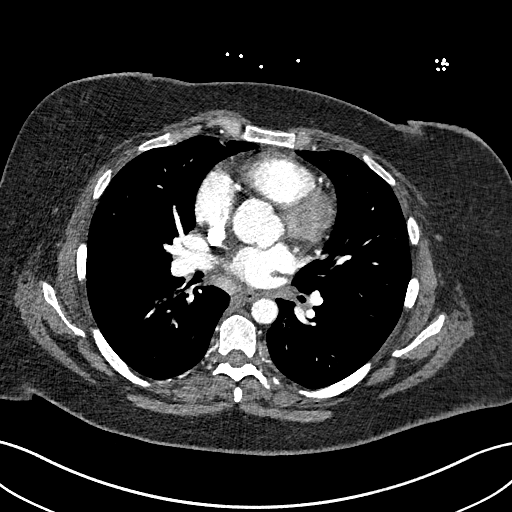
[im 215/387  lung]
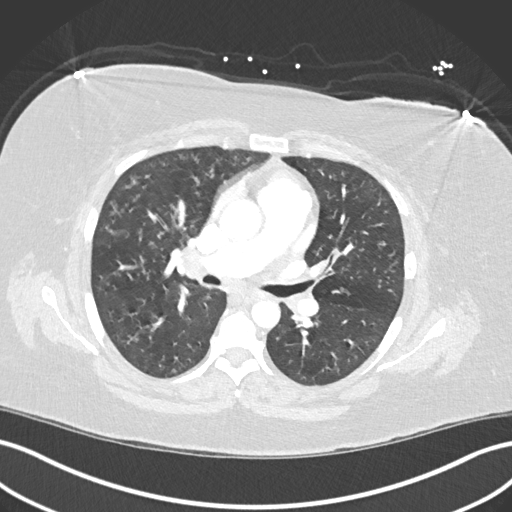
[im 236/387  soft-tissue]
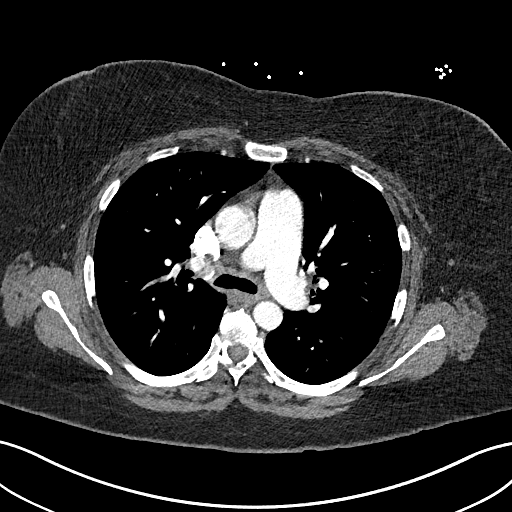
[im 258/387  lung]
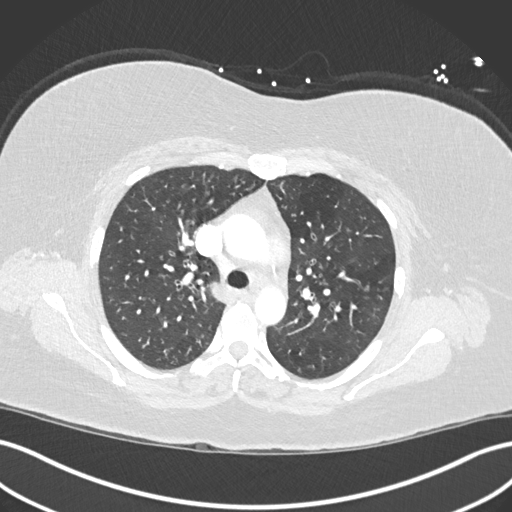
[im 301/387  soft-tissue]
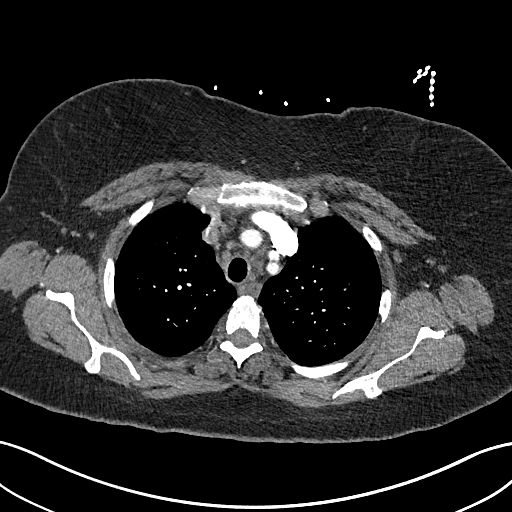
[im 322/387  lung]
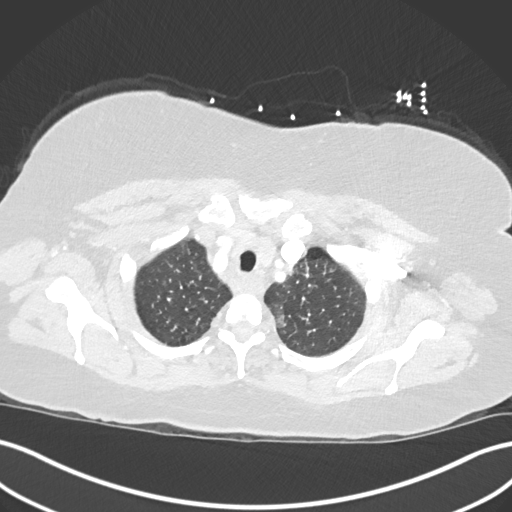
[im 344/387  soft-tissue]
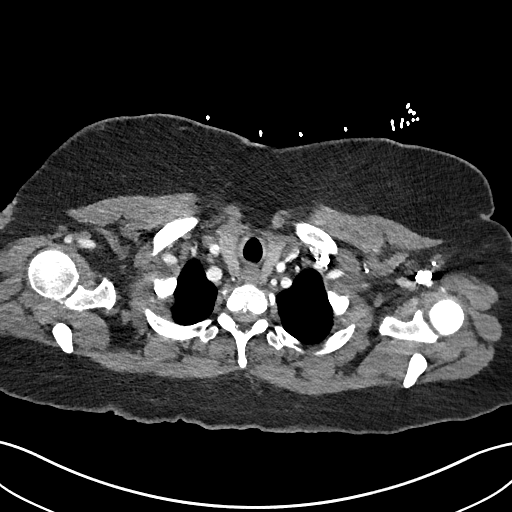
[im 365/387  lung]
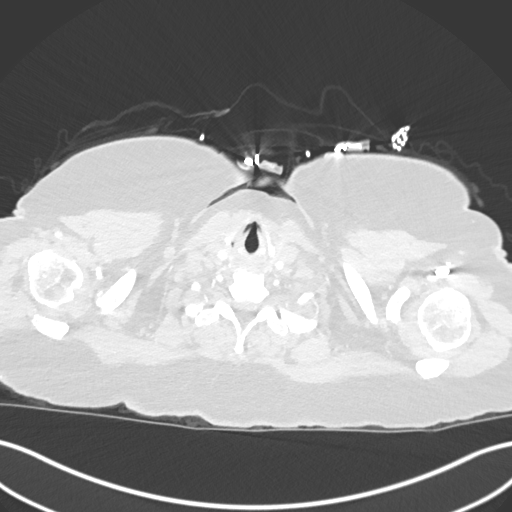

[Series 7: cor soft · coronal · 0.64mm/px · 3 of 170 slices shown]
[im 43/170  soft-tissue]
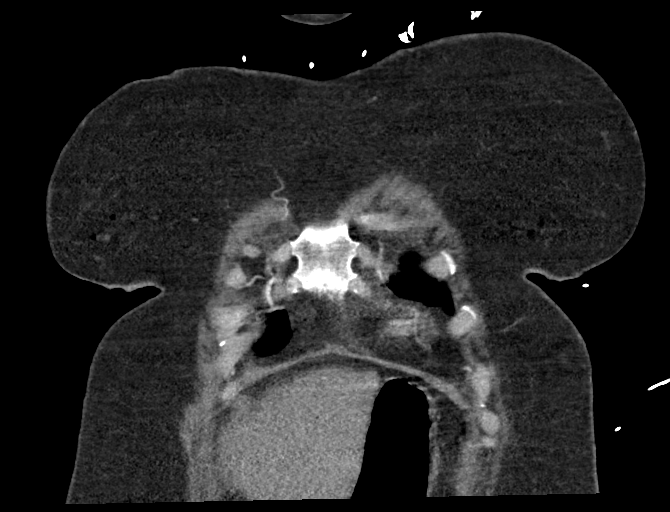
[im 85/170  soft-tissue]
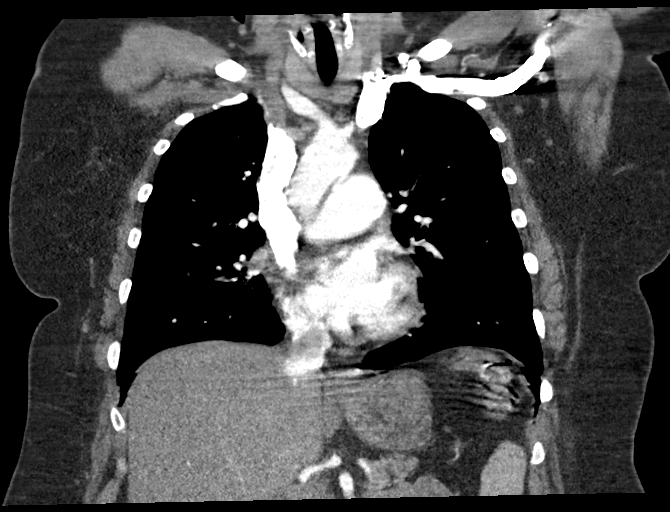
[im 127/170  soft-tissue]
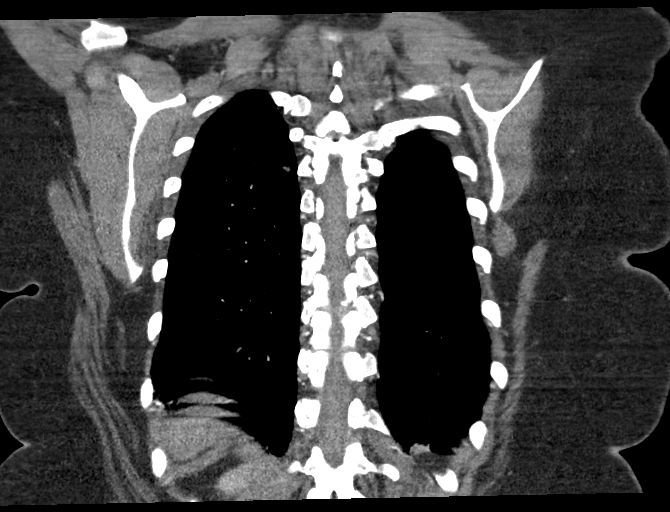

[18 of 46 positions shown; findings below may reference images not displayed]

RADIATION DOSE REDUCTION: This exam was performed according to the
departmental dose-optimization program which includes automated
exposure control, adjustment of the mA and/or kV according to
patient size and/or use of iterative reconstruction technique.

CONTRAST:  75mL OMNIPAQUE IOHEXOL 350 MG/ML SOLN
FINDINGS: Cardiovascular: Heart and aorta are normal in size. The main
pulmonary artery is enlarged. There is adequate opacification of the
pulmonary arteries to the segmental level. There is no evidence for
pulmonary embolism.

Mediastinum/Nodes: Visualized thyroid gland is within normal limits.
There are diffuse nonenlarged mediastinal lymph nodes. There is
enlarged right hilar lymph node measuring 14 mm short axis. There
are nonenlarged left hilar lymph nodes. Esophagus is within normal
limits.

Lungs/Pleura: There are diffuse tree-in-bud opacities throughout
both lungs. There is no focal lung consolidation, pleural effusion,
or pneumothorax. Trachea and central airways are patent.

Upper Abdomen: No acute abnormality.

Musculoskeletal: No chest wall abnormality. No acute or significant
osseous findings.

Review of the MIP images confirms the above findings.
IMPRESSION: 1. No evidence for pulmonary embolism.
2. Diffuse tree-in-bud opacities throughout both lungs compatible
with infectious/inflammatory process.
3. Right hilar lymphadenopathy may be reactive. Follow-up
recommended.
4. Enlarged main pulmonary artery which can be seen with pulmonary
artery hypertension.

## 2023-09-22 DIAGNOSIS — Z23 Encounter for immunization: Secondary | ICD-10-CM | POA: Diagnosis not present

## 2023-09-22 DIAGNOSIS — E785 Hyperlipidemia, unspecified: Secondary | ICD-10-CM | POA: Diagnosis not present

## 2023-09-22 DIAGNOSIS — M2550 Pain in unspecified joint: Secondary | ICD-10-CM | POA: Diagnosis not present

## 2023-09-22 DIAGNOSIS — K219 Gastro-esophageal reflux disease without esophagitis: Secondary | ICD-10-CM | POA: Diagnosis not present

## 2023-09-22 DIAGNOSIS — Z713 Dietary counseling and surveillance: Secondary | ICD-10-CM | POA: Diagnosis not present

## 2023-09-22 DIAGNOSIS — F419 Anxiety disorder, unspecified: Secondary | ICD-10-CM | POA: Diagnosis not present

## 2023-09-22 DIAGNOSIS — E663 Overweight: Secondary | ICD-10-CM | POA: Diagnosis not present

## 2023-09-22 DIAGNOSIS — J449 Chronic obstructive pulmonary disease, unspecified: Secondary | ICD-10-CM | POA: Diagnosis not present

## 2023-09-22 DIAGNOSIS — E669 Obesity, unspecified: Secondary | ICD-10-CM | POA: Diagnosis not present

## 2023-09-22 DIAGNOSIS — I509 Heart failure, unspecified: Secondary | ICD-10-CM | POA: Diagnosis not present

## 2024-01-26 ENCOUNTER — Emergency Department (HOSPITAL_COMMUNITY)

## 2024-01-26 ENCOUNTER — Other Ambulatory Visit: Payer: Self-pay

## 2024-01-26 ENCOUNTER — Inpatient Hospital Stay (HOSPITAL_COMMUNITY)

## 2024-01-26 ENCOUNTER — Encounter (HOSPITAL_COMMUNITY): Payer: Self-pay | Admitting: Pulmonary Disease

## 2024-01-26 ENCOUNTER — Inpatient Hospital Stay (HOSPITAL_COMMUNITY)
Admission: EM | Admit: 2024-01-26 | Discharge: 2024-02-14 | DRG: 917 | Disposition: E | Attending: Critical Care Medicine | Admitting: Critical Care Medicine

## 2024-01-26 DIAGNOSIS — I110 Hypertensive heart disease with heart failure: Secondary | ICD-10-CM | POA: Diagnosis present

## 2024-01-26 DIAGNOSIS — S2241XA Multiple fractures of ribs, right side, initial encounter for closed fracture: Secondary | ICD-10-CM | POA: Diagnosis not present

## 2024-01-26 DIAGNOSIS — F419 Anxiety disorder, unspecified: Secondary | ICD-10-CM | POA: Diagnosis not present

## 2024-01-26 DIAGNOSIS — E872 Acidosis, unspecified: Secondary | ICD-10-CM | POA: Diagnosis present

## 2024-01-26 DIAGNOSIS — Z4682 Encounter for fitting and adjustment of non-vascular catheter: Secondary | ICD-10-CM | POA: Diagnosis not present

## 2024-01-26 DIAGNOSIS — R7401 Elevation of levels of liver transaminase levels: Secondary | ICD-10-CM | POA: Diagnosis not present

## 2024-01-26 DIAGNOSIS — R55 Syncope and collapse: Secondary | ICD-10-CM | POA: Diagnosis not present

## 2024-01-26 DIAGNOSIS — I5022 Chronic systolic (congestive) heart failure: Secondary | ICD-10-CM | POA: Diagnosis present

## 2024-01-26 DIAGNOSIS — T40411A Poisoning by fentanyl or fentanyl analogs, accidental (unintentional), initial encounter: Principal | ICD-10-CM | POA: Diagnosis present

## 2024-01-26 DIAGNOSIS — J9601 Acute respiratory failure with hypoxia: Secondary | ICD-10-CM | POA: Diagnosis present

## 2024-01-26 DIAGNOSIS — F1110 Opioid abuse, uncomplicated: Secondary | ICD-10-CM | POA: Diagnosis present

## 2024-01-26 DIAGNOSIS — F32A Depression, unspecified: Secondary | ICD-10-CM | POA: Diagnosis not present

## 2024-01-26 DIAGNOSIS — J690 Pneumonitis due to inhalation of food and vomit: Secondary | ICD-10-CM | POA: Diagnosis present

## 2024-01-26 DIAGNOSIS — G936 Cerebral edema: Secondary | ICD-10-CM | POA: Diagnosis present

## 2024-01-26 DIAGNOSIS — E785 Hyperlipidemia, unspecified: Secondary | ICD-10-CM | POA: Diagnosis present

## 2024-01-26 DIAGNOSIS — F1490 Cocaine use, unspecified, uncomplicated: Secondary | ICD-10-CM | POA: Diagnosis present

## 2024-01-26 DIAGNOSIS — F17210 Nicotine dependence, cigarettes, uncomplicated: Secondary | ICD-10-CM | POA: Diagnosis not present

## 2024-01-26 DIAGNOSIS — I5020 Unspecified systolic (congestive) heart failure: Secondary | ICD-10-CM

## 2024-01-26 DIAGNOSIS — G931 Anoxic brain damage, not elsewhere classified: Secondary | ICD-10-CM | POA: Diagnosis not present

## 2024-01-26 DIAGNOSIS — J9602 Acute respiratory failure with hypercapnia: Secondary | ICD-10-CM | POA: Diagnosis present

## 2024-01-26 DIAGNOSIS — Z6841 Body Mass Index (BMI) 40.0 and over, adult: Secondary | ICD-10-CM

## 2024-01-26 DIAGNOSIS — I468 Cardiac arrest due to other underlying condition: Secondary | ICD-10-CM | POA: Diagnosis present

## 2024-01-26 DIAGNOSIS — R404 Transient alteration of awareness: Secondary | ICD-10-CM | POA: Diagnosis not present

## 2024-01-26 DIAGNOSIS — R092 Respiratory arrest: Principal | ICD-10-CM

## 2024-01-26 DIAGNOSIS — R531 Weakness: Secondary | ICD-10-CM | POA: Diagnosis not present

## 2024-01-26 DIAGNOSIS — R4020 Unspecified coma: Secondary | ICD-10-CM | POA: Diagnosis present

## 2024-01-26 DIAGNOSIS — E669 Obesity, unspecified: Secondary | ICD-10-CM | POA: Diagnosis present

## 2024-01-26 DIAGNOSIS — I959 Hypotension, unspecified: Secondary | ICD-10-CM | POA: Diagnosis present

## 2024-01-26 DIAGNOSIS — I469 Cardiac arrest, cause unspecified: Secondary | ICD-10-CM

## 2024-01-26 DIAGNOSIS — J449 Chronic obstructive pulmonary disease, unspecified: Secondary | ICD-10-CM | POA: Diagnosis not present

## 2024-01-26 DIAGNOSIS — N3289 Other specified disorders of bladder: Secondary | ICD-10-CM | POA: Diagnosis not present

## 2024-01-26 DIAGNOSIS — N179 Acute kidney failure, unspecified: Secondary | ICD-10-CM | POA: Diagnosis not present

## 2024-01-26 DIAGNOSIS — Z66 Do not resuscitate: Secondary | ICD-10-CM | POA: Diagnosis not present

## 2024-01-26 DIAGNOSIS — Z87891 Personal history of nicotine dependence: Secondary | ICD-10-CM

## 2024-01-26 DIAGNOSIS — F4310 Post-traumatic stress disorder, unspecified: Secondary | ICD-10-CM | POA: Diagnosis present

## 2024-01-26 DIAGNOSIS — Z885 Allergy status to narcotic agent status: Secondary | ICD-10-CM

## 2024-01-26 DIAGNOSIS — Z452 Encounter for adjustment and management of vascular access device: Secondary | ICD-10-CM | POA: Diagnosis not present

## 2024-01-26 DIAGNOSIS — Z79899 Other long term (current) drug therapy: Secondary | ICD-10-CM

## 2024-01-26 DIAGNOSIS — E870 Hyperosmolality and hypernatremia: Secondary | ICD-10-CM | POA: Diagnosis not present

## 2024-01-26 DIAGNOSIS — R918 Other nonspecific abnormal finding of lung field: Secondary | ICD-10-CM | POA: Diagnosis not present

## 2024-01-26 DIAGNOSIS — R569 Unspecified convulsions: Secondary | ICD-10-CM | POA: Diagnosis not present

## 2024-01-26 DIAGNOSIS — I700 Atherosclerosis of aorta: Secondary | ICD-10-CM | POA: Diagnosis not present

## 2024-01-26 DIAGNOSIS — G253 Myoclonus: Secondary | ICD-10-CM | POA: Diagnosis present

## 2024-01-26 DIAGNOSIS — R7303 Prediabetes: Secondary | ICD-10-CM | POA: Diagnosis present

## 2024-01-26 DIAGNOSIS — Z515 Encounter for palliative care: Secondary | ICD-10-CM

## 2024-01-26 DIAGNOSIS — Z7951 Long term (current) use of inhaled steroids: Secondary | ICD-10-CM

## 2024-01-26 DIAGNOSIS — D649 Anemia, unspecified: Secondary | ICD-10-CM | POA: Diagnosis not present

## 2024-01-26 DIAGNOSIS — R739 Hyperglycemia, unspecified: Secondary | ICD-10-CM | POA: Diagnosis present

## 2024-01-26 DIAGNOSIS — Z8249 Family history of ischemic heart disease and other diseases of the circulatory system: Secondary | ICD-10-CM

## 2024-01-26 DIAGNOSIS — R9431 Abnormal electrocardiogram [ECG] [EKG]: Secondary | ICD-10-CM | POA: Diagnosis not present

## 2024-01-26 DIAGNOSIS — D689 Coagulation defect, unspecified: Secondary | ICD-10-CM | POA: Diagnosis not present

## 2024-01-26 LAB — POCT I-STAT 7, (LYTES, BLD GAS, ICA,H+H)
Acid-base deficit: 2 mmol/L (ref 0.0–2.0)
Acid-base deficit: 2 mmol/L (ref 0.0–2.0)
Bicarbonate: 27.5 mmol/L (ref 20.0–28.0)
Bicarbonate: 24.4 mmol/L (ref 20.0–28.0)
Calcium, Ion: 1.16 mmol/L (ref 1.15–1.40)
Calcium, Ion: 1.14 mmol/L — ABNORMAL LOW (ref 1.15–1.40)
HCT: 43 % (ref 36.0–46.0)
HCT: 39 % (ref 36.0–46.0)
Hemoglobin: 14.6 g/dL (ref 12.0–15.0)
Hemoglobin: 13.3 g/dL (ref 12.0–15.0)
O2 Saturation: 100 %
O2 Saturation: 95 %
Patient temperature: 36.1
Patient temperature: 37.9
Potassium: 4.4 mmol/L (ref 3.5–5.1)
Potassium: 4.4 mmol/L (ref 3.5–5.1)
Sodium: 143 mmol/L (ref 135–145)
Sodium: 142 mmol/L (ref 135–145)
TCO2: 30 mmol/L (ref 22–32)
TCO2: 26 mmol/L (ref 22–32)
pCO2 arterial: 65.2 mmHg (ref 32–48)
pCO2 arterial: 50.5 mmHg — ABNORMAL HIGH (ref 32–48)
pH, Arterial: 7.227 — ABNORMAL LOW (ref 7.35–7.45)
pH, Arterial: 7.297 — ABNORMAL LOW (ref 7.35–7.45)
pO2, Arterial: 422 mmHg — ABNORMAL HIGH (ref 83–108)
pO2, Arterial: 90 mmHg (ref 83–108)

## 2024-01-26 LAB — MAGNESIUM
Magnesium: 2.1 mg/dL (ref 1.7–2.4)
Magnesium: 1.9 mg/dL (ref 1.7–2.4)

## 2024-01-26 LAB — URINALYSIS, ROUTINE W REFLEX MICROSCOPIC
Bacteria, UA: NONE SEEN
Bilirubin Urine: NEGATIVE
Bilirubin Urine: NEGATIVE
Glucose, UA: NEGATIVE mg/dL
Glucose, UA: NEGATIVE mg/dL
Hgb urine dipstick: NEGATIVE
Hgb urine dipstick: NEGATIVE
Ketones, ur: 5 mg/dL — AB
Ketones, ur: 5 mg/dL — AB
Leukocytes,Ua: NEGATIVE
Leukocytes,Ua: NEGATIVE
Nitrite: NEGATIVE
Nitrite: NEGATIVE
Protein, ur: 30 mg/dL — AB
Protein, ur: NEGATIVE mg/dL
Specific Gravity, Urine: 1.026 (ref 1.005–1.030)
Specific Gravity, Urine: 1.029 (ref 1.005–1.030)
pH: 5 (ref 5.0–8.0)
pH: 5 (ref 5.0–8.0)

## 2024-01-26 LAB — COMPREHENSIVE METABOLIC PANEL WITH GFR
ALT: 46 U/L — ABNORMAL HIGH (ref 0–44)
ALT: 40 U/L (ref 0–44)
AST: 81 U/L — ABNORMAL HIGH (ref 15–41)
AST: 47 U/L — ABNORMAL HIGH (ref 15–41)
Albumin: 3.2 g/dL — ABNORMAL LOW (ref 3.5–5.0)
Albumin: 2.8 g/dL — ABNORMAL LOW (ref 3.5–5.0)
Alkaline Phosphatase: 71 U/L (ref 38–126)
Alkaline Phosphatase: 68 U/L (ref 38–126)
Anion gap: 16 — ABNORMAL HIGH (ref 5–15)
Anion gap: 12 (ref 5–15)
BUN: 14 mg/dL (ref 6–20)
BUN: 23 mg/dL — ABNORMAL HIGH (ref 6–20)
CO2: 18 mmol/L — ABNORMAL LOW (ref 22–32)
CO2: 23 mmol/L (ref 22–32)
Calcium: 8.2 mg/dL — ABNORMAL LOW (ref 8.9–10.3)
Calcium: 8 mg/dL — ABNORMAL LOW (ref 8.9–10.3)
Chloride: 107 mmol/L (ref 98–111)
Chloride: 106 mmol/L (ref 98–111)
Creatinine, Ser: 1.22 mg/dL — ABNORMAL HIGH (ref 0.44–1.00)
Creatinine, Ser: 1.03 mg/dL — ABNORMAL HIGH (ref 0.44–1.00)
GFR, Estimated: 54 mL/min — ABNORMAL LOW (ref >60)
GFR, Estimated: >60 mL/min (ref >60)
Glucose, Bld: 256 mg/dL — ABNORMAL HIGH (ref 70–99)
Glucose, Bld: 113 mg/dL — ABNORMAL HIGH (ref 70–99)
Potassium: 4.6 mmol/L (ref 3.5–5.1)
Potassium: 4.5 mmol/L (ref 3.5–5.1)
Sodium: 141 mmol/L (ref 135–145)
Sodium: 141 mmol/L (ref 135–145)
Total Bilirubin: 0.2 mg/dL (ref 0.0–1.2)
Total Bilirubin: 0.8 mg/dL (ref 0.0–1.2)
Total Protein: 6.5 g/dL (ref 6.5–8.1)
Total Protein: 6.4 g/dL — ABNORMAL LOW (ref 6.5–8.1)

## 2024-01-26 LAB — CBC WITH DIFFERENTIAL/PLATELET
Abs Immature Granulocytes: 0.41 10*3/uL — ABNORMAL HIGH (ref 0.00–0.07)
Basophils Absolute: 0.2 10*3/uL — ABNORMAL HIGH (ref 0.0–0.1)
Basophils Relative: 1 %
Eosinophils Absolute: 0.4 10*3/uL (ref 0.0–0.5)
Eosinophils Relative: 2 %
HCT: 45.9 % (ref 36.0–46.0)
Hemoglobin: 14 g/dL (ref 12.0–15.0)
Immature Granulocytes: 2 %
Lymphocytes Relative: 56 %
Lymphs Abs: 13.2 10*3/uL — ABNORMAL HIGH (ref 0.7–4.0)
MCH: 30.2 pg (ref 26.0–34.0)
MCHC: 30.5 g/dL (ref 30.0–36.0)
MCV: 99.1 fL (ref 80.0–100.0)
Monocytes Absolute: 1.2 10*3/uL — ABNORMAL HIGH (ref 0.1–1.0)
Monocytes Relative: 5 %
Neutro Abs: 8 10*3/uL — ABNORMAL HIGH (ref 1.7–7.7)
Neutrophils Relative %: 34 %
Platelets: 357 10*3/uL (ref 150–400)
RBC: 4.63 MIL/uL (ref 3.87–5.11)
RDW: 13.4 % (ref 11.5–15.5)
WBC: 23.4 10*3/uL — ABNORMAL HIGH (ref 4.0–10.5)
nRBC: 0 % (ref 0.0–0.2)

## 2024-01-26 LAB — RAPID URINE DRUG SCREEN, HOSP PERFORMED
Amphetamines: NOT DETECTED
Barbiturates: NOT DETECTED
Benzodiazepines: NOT DETECTED
Cocaine: POSITIVE — AB
Opiates: NOT DETECTED
Tetrahydrocannabinol: NOT DETECTED

## 2024-01-26 LAB — PROTIME-INR
INR: 1.2 (ref 0.8–1.2)
Prothrombin Time: 15 s (ref 11.4–15.2)

## 2024-01-26 LAB — ECHOCARDIOGRAM COMPLETE
Area-P 1/2: 4.21 cm2
Calc EF: 54.6 %
Height: 63 in
S' Lateral: 2.8 cm
Single Plane A2C EF: 51.8 %
Single Plane A4C EF: 55.3 %
Weight: 3777.8 [oz_av]

## 2024-01-26 LAB — CBC
HCT: 40.2 % (ref 36.0–46.0)
HCT: 42.6 % (ref 36.0–46.0)
Hemoglobin: 14.4 g/dL (ref 12.0–15.0)
Hemoglobin: 13.7 g/dL (ref 12.0–15.0)
MCH: 35.8 pg — ABNORMAL HIGH (ref 26.0–34.0)
MCH: 29.8 pg (ref 26.0–34.0)
MCHC: 35.8 g/dL (ref 30.0–36.0)
MCHC: 32.2 g/dL (ref 30.0–36.0)
MCV: 100 fL (ref 80.0–100.0)
MCV: 92.8 fL (ref 80.0–100.0)
Platelets: 280 10*3/uL (ref 150–400)
Platelets: 262 10*3/uL (ref 150–400)
RBC: 4.02 MIL/uL (ref 3.87–5.11)
RBC: 4.59 MIL/uL (ref 3.87–5.11)
RDW: 13.2 % (ref 11.5–15.5)
RDW: 13.6 % (ref 11.5–15.5)
WBC: 11.7 10*3/uL — ABNORMAL HIGH (ref 4.0–10.5)
WBC: 16 10*3/uL — ABNORMAL HIGH (ref 4.0–10.5)
nRBC: 0 % (ref 0.0–0.2)
nRBC: 0 % (ref 0.0–0.2)

## 2024-01-26 LAB — SARS CORONAVIRUS 2 BY RT PCR: SARS Coronavirus 2 by RT PCR: NEGATIVE

## 2024-01-26 LAB — GLUCOSE, CAPILLARY
Glucose-Capillary: 109 mg/dL — ABNORMAL HIGH (ref 70–99)
Glucose-Capillary: 113 mg/dL — ABNORMAL HIGH (ref 70–99)
Glucose-Capillary: 118 mg/dL — ABNORMAL HIGH (ref 70–99)
Glucose-Capillary: 120 mg/dL — ABNORMAL HIGH (ref 70–99)
Glucose-Capillary: 128 mg/dL — ABNORMAL HIGH (ref 70–99)
Glucose-Capillary: 131 mg/dL — ABNORMAL HIGH (ref 70–99)
Glucose-Capillary: 138 mg/dL — ABNORMAL HIGH (ref 70–99)

## 2024-01-26 LAB — APTT: aPTT: 34 s (ref 24–36)

## 2024-01-26 LAB — PHOSPHORUS
Phosphorus: 5 mg/dL — ABNORMAL HIGH (ref 2.5–4.6)
Phosphorus: 4.8 mg/dL — ABNORMAL HIGH (ref 2.5–4.6)
Phosphorus: 4.9 mg/dL — ABNORMAL HIGH (ref 2.5–4.6)

## 2024-01-26 LAB — HIV ANTIBODY (ROUTINE TESTING W REFLEX): HIV Screen 4th Generation wRfx: NONREACTIVE

## 2024-01-26 LAB — BILIRUBIN, DIRECT: Bilirubin, Direct: 0.1 mg/dL (ref 0.0–0.2)

## 2024-01-26 LAB — HCG, SERUM, QUALITATIVE: Preg, Serum: NEGATIVE

## 2024-01-26 LAB — BASIC METABOLIC PANEL WITH GFR
Anion gap: 12 (ref 5–15)
BUN: 20 mg/dL (ref 6–20)
CO2: 23 mmol/L (ref 22–32)
Calcium: 8.2 mg/dL — ABNORMAL LOW (ref 8.9–10.3)
Chloride: 108 mmol/L (ref 98–111)
Creatinine, Ser: 1.18 mg/dL — ABNORMAL HIGH (ref 0.44–1.00)
GFR, Estimated: 56 mL/min — ABNORMAL LOW (ref >60)
Glucose, Bld: 129 mg/dL — ABNORMAL HIGH (ref 70–99)
Potassium: 4.7 mmol/L (ref 3.5–5.1)
Sodium: 143 mmol/L (ref 135–145)

## 2024-01-26 LAB — MRSA NEXT GEN BY PCR, NASAL: MRSA by PCR Next Gen: NOT DETECTED

## 2024-01-26 LAB — PATHOLOGIST SMEAR REVIEW

## 2024-01-26 LAB — TROPONIN I (HIGH SENSITIVITY)
Troponin I (High Sensitivity): 9 ng/L (ref <18)
Troponin I (High Sensitivity): 113 ng/L (ref <18)

## 2024-01-26 LAB — LIPASE, BLOOD
Lipase: 36 U/L (ref 11–51)
Lipase: 21 U/L (ref 11–51)

## 2024-01-26 LAB — HEMOGLOBIN A1C
Hgb A1c MFr Bld: 5.7 % — ABNORMAL HIGH (ref 4.8–5.6)
Mean Plasma Glucose: 116.89 mg/dL

## 2024-01-26 LAB — LACTIC ACID, PLASMA: Lactic Acid, Venous: 2.1 mmol/L (ref 0.5–1.9)

## 2024-01-26 LAB — AMYLASE: Amylase: 21 U/L — ABNORMAL LOW (ref 28–100)

## 2024-01-26 LAB — ETHANOL: Alcohol, Ethyl (B): <15 mg/dL (ref <15)

## 2024-01-26 LAB — FIBRINOGEN: Fibrinogen: 641 mg/dL — ABNORMAL HIGH (ref 210–475)

## 2024-01-26 MED ORDER — FENTANYL BOLUS VIA INFUSION
25.0000 ug | INTRAVENOUS | Status: DC | PRN
  Administered 2024-01-26: 100 ug via INTRAVENOUS

## 2024-01-26 MED ORDER — ORAL CARE MOUTH RINSE
15.0000 mL | OROMUCOSAL | Status: AC | PRN
Start: 2024-01-26 — End: ?

## 2024-01-26 MED ORDER — MAGNESIUM SULFATE 2 GM/50ML IV SOLN
2.0000 g | Freq: Once | INTRAVENOUS | Status: AC
  Administered 2024-01-26: 2 g via INTRAVENOUS
  Filled 2024-01-26: qty 50

## 2024-01-26 MED ORDER — PIPERACILLIN-TAZOBACTAM 3.375 G IVPB
3.3750 g | Freq: Three times a day (TID) | INTRAVENOUS | Status: DC
  Administered 2024-01-26: 3.375 g via INTRAVENOUS
  Filled 2024-01-26: qty 50

## 2024-01-26 MED ORDER — SODIUM CHLORIDE 0.9 % IV SOLN
250.0000 mL | INTRAVENOUS | Status: AC
  Administered 2024-01-26: 250 mL via INTRAVENOUS

## 2024-01-26 MED ORDER — PIPERACILLIN-TAZOBACTAM 3.375 G IVPB: 3.3750 g | Freq: Three times a day (TID) | INTRAVENOUS | Status: DC

## 2024-01-26 MED ORDER — PHENYLEPHRINE HCL-NACL 20-0.9 MG/250ML-% IV SOLN
25.0000 ug/min | INTRAVENOUS | Status: DC
  Administered 2024-01-26: 25 ug/min via INTRAVENOUS
  Administered 2024-01-26: 125 ug/min via INTRAVENOUS
  Filled 2024-01-26 (×2): qty 250

## 2024-01-26 MED ORDER — INSULIN ASPART 100 UNIT/ML IJ SOLN: 0.0000 [IU] | Freq: Three times a day (TID) | INTRAMUSCULAR | Status: DC

## 2024-01-26 MED ORDER — FENTANYL CITRATE PF 50 MCG/ML IJ SOSY: 25.0000 ug | PREFILLED_SYRINGE | Freq: Once | INTRAMUSCULAR | Status: DC

## 2024-01-26 MED ORDER — GLYCOPYRROLATE 0.2 MG/ML IJ SOLN: 0.2000 mg | INTRAMUSCULAR | Status: DC | PRN

## 2024-01-26 MED ORDER — CHLORHEXIDINE GLUCONATE CLOTH 2 % EX PADS
6.0000 | MEDICATED_PAD | Freq: Every day | CUTANEOUS | Status: DC
  Administered 2024-01-26 – 2024-01-27 (×3): 6 via TOPICAL

## 2024-01-26 MED ORDER — LEVETIRACETAM (KEPPRA) 500 MG/5 ML ADULT IV PUSH
500.0000 mg | Freq: Two times a day (BID) | INTRAVENOUS | Status: DC
  Administered 2024-01-26 – 2024-01-28 (×5): 500 mg via INTRAVENOUS
  Filled 2024-01-26 (×5): qty 5

## 2024-01-26 MED ORDER — LEVETIRACETAM 500 MG PO TABS: 500.0000 mg | ORAL_TABLET | Freq: Two times a day (BID) | ORAL | Status: DC

## 2024-01-26 MED ORDER — ACETAMINOPHEN 325 MG PO TABS: 650.0000 mg | ORAL_TABLET | Freq: Four times a day (QID) | ORAL | Status: DC | PRN

## 2024-01-26 MED ORDER — NOREPINEPHRINE 4 MG/250ML-% IV SOLN
0.0000 ug/min | INTRAVENOUS | Status: DC
  Administered 2024-01-26: 2 ug/min via INTRAVENOUS
  Filled 2024-01-26: qty 250

## 2024-01-26 MED ORDER — FAMOTIDINE IN NACL 20-0.9 MG/50ML-% IV SOLN: 20.0000 mg | Freq: Two times a day (BID) | INTRAVENOUS | Status: DC

## 2024-01-26 MED ORDER — LACTATED RINGERS IV SOLN: INTRAVENOUS | Status: AC

## 2024-01-26 MED ORDER — MIDAZOLAM HCL 2 MG/2ML IJ SOLN: 2.0000 mg | INTRAMUSCULAR | Status: DC | PRN

## 2024-01-26 MED ORDER — IOHEXOL 350 MG/ML SOLN
75.0000 mL | Freq: Once | INTRAVENOUS | Status: AC | PRN
  Administered 2024-01-26: 75 mL via INTRAVENOUS

## 2024-01-26 MED ORDER — ORAL CARE MOUTH RINSE
15.0000 mL | OROMUCOSAL | Status: DC
  Administered 2024-01-26 – 2024-01-28 (×30): 15 mL via OROMUCOSAL

## 2024-01-26 MED ORDER — ENOXAPARIN SODIUM 40 MG/0.4ML IJ SOSY
40.0000 mg | PREFILLED_SYRINGE | Freq: Every day | INTRAMUSCULAR | Status: DC
  Administered 2024-01-26: 40 mg via SUBCUTANEOUS
  Filled 2024-01-26: qty 0.4

## 2024-01-26 MED ORDER — PIPERACILLIN-TAZOBACTAM 3.375 G IVPB
3.3750 g | Freq: Three times a day (TID) | INTRAVENOUS | Status: DC
  Administered 2024-01-26 – 2024-01-28 (×6): 3.375 g via INTRAVENOUS
  Filled 2024-01-26 (×6): qty 50

## 2024-01-26 MED ORDER — INSULIN ASPART 100 UNIT/ML IJ SOLN: 0.0000 [IU] | INTRAMUSCULAR | Status: DC

## 2024-01-26 MED ORDER — IPRATROPIUM-ALBUTEROL 0.5-2.5 (3) MG/3ML IN SOLN: 3.0000 mL | RESPIRATORY_TRACT | Status: DC | PRN

## 2024-01-26 MED ORDER — INSULIN ASPART 100 UNIT/ML IJ SOLN
0.0000 [IU] | INTRAMUSCULAR | Status: DC
  Administered 2024-01-26 – 2024-01-27 (×6): 2 [IU] via SUBCUTANEOUS
  Administered 2024-01-28: 3 [IU] via SUBCUTANEOUS
  Administered 2024-01-28: 2 [IU] via SUBCUTANEOUS

## 2024-01-26 MED ORDER — IPRATROPIUM-ALBUTEROL 0.5-2.5 (3) MG/3ML IN SOLN
3.0000 mL | Freq: Four times a day (QID) | RESPIRATORY_TRACT | Status: DC
  Administered 2024-01-26 (×2): 3 mL via RESPIRATORY_TRACT
  Filled 2024-01-26 (×2): qty 3

## 2024-01-26 MED ORDER — NOREPINEPHRINE 4 MG/250ML-% IV SOLN
0.0000 ug/min | INTRAVENOUS | Status: DC
  Administered 2024-01-26: 4 ug/min via INTRAVENOUS
  Administered 2024-01-27: 10 ug/min via INTRAVENOUS
  Filled 2024-01-26: qty 250

## 2024-01-26 MED ORDER — ACETAMINOPHEN 650 MG RE SUPP: 650.0000 mg | Freq: Four times a day (QID) | RECTAL | Status: DC | PRN

## 2024-01-26 MED ORDER — POLYETHYLENE GLYCOL 3350 17 G PO PACK: 17.0000 g | PACK | Freq: Every day | ORAL | Status: DC | PRN

## 2024-01-26 MED ORDER — LEVETIRACETAM (KEPPRA) 500 MG/5 ML ADULT IV PUSH
3000.0000 mg | INTRAVENOUS | Status: AC
  Administered 2024-01-26: 3000 mg via INTRAVENOUS
  Filled 2024-01-26: qty 30

## 2024-01-26 MED ORDER — MORPHINE 100MG IN NS 100ML (1MG/ML) PREMIX INFUSION
0.0000 mg/h | INTRAVENOUS | Status: DC
  Administered 2024-01-26 – 2024-01-28 (×3): 5 mg/h via INTRAVENOUS
  Filled 2024-01-26 (×3): qty 100

## 2024-01-26 MED ORDER — LACTATED RINGERS IV SOLN: INTRAVENOUS | Status: DC

## 2024-01-26 MED ORDER — GLYCOPYRROLATE 1 MG PO TABS: 1.0000 mg | ORAL_TABLET | ORAL | Status: DC | PRN

## 2024-01-26 MED ORDER — ACETAMINOPHEN 160 MG/5ML PO SOLN
650.0000 mg | Freq: Four times a day (QID) | ORAL | Status: DC | PRN
  Filled 2024-01-26: qty 20.3

## 2024-01-26 MED ORDER — PANTOPRAZOLE SODIUM 40 MG IV SOLR
40.0000 mg | INTRAVENOUS | Status: DC
  Administered 2024-01-27 – 2024-01-28 (×2): 40 mg via INTRAVENOUS
  Filled 2024-01-26 (×2): qty 10

## 2024-01-26 MED ORDER — MORPHINE BOLUS VIA INFUSION: 5.0000 mg | INTRAVENOUS | Status: DC | PRN

## 2024-01-26 MED ORDER — PERFLUTREN LIPID MICROSPHERE
1.0000 mL | INTRAVENOUS | Status: AC | PRN
  Administered 2024-01-26: 2 mL via INTRAVENOUS

## 2024-01-26 MED ORDER — FENTANYL 2500MCG IN NS 250ML (10MCG/ML) PREMIX INFUSION
0.0000 ug/h | INTRAVENOUS | Status: DC
  Administered 2024-01-26: 50 ug/h via INTRAVENOUS
  Filled 2024-01-26: qty 250

## 2024-01-26 MED ORDER — LACTATED RINGERS IV BOLUS
1000.0000 mL | Freq: Once | INTRAVENOUS | Status: AC
  Administered 2024-01-26: 1000 mL via INTRAVENOUS

## 2024-01-26 MED ORDER — ENOXAPARIN SODIUM 40 MG/0.4ML IJ SOSY
40.0000 mg | PREFILLED_SYRINGE | INTRAMUSCULAR | Status: DC
  Administered 2024-01-27 – 2024-01-28 (×2): 40 mg via SUBCUTANEOUS
  Filled 2024-01-26 (×2): qty 0.4

## 2024-01-26 MED ORDER — FAMOTIDINE IN NACL 20-0.9 MG/50ML-% IV SOLN
20.0000 mg | Freq: Every day | INTRAVENOUS | Status: DC
  Filled 2024-01-26: qty 50

## 2024-01-26 MED ORDER — LACTATED RINGERS IV BOLUS
500.0000 mL | Freq: Once | INTRAVENOUS | Status: AC
  Administered 2024-01-26: 500 mL via INTRAVENOUS

## 2024-01-26 MED ORDER — POLYVINYL ALCOHOL 1.4 % OP SOLN: 1.0000 [drp] | Freq: Four times a day (QID) | OPHTHALMIC | Status: DC | PRN

## 2024-01-26 MED ORDER — SODIUM CHLORIDE 0.9 % IV SOLN
3.0000 g | Freq: Once | INTRAVENOUS | Status: AC
  Administered 2024-01-26: 3 g via INTRAVENOUS
  Filled 2024-01-26: qty 8

## 2024-01-26 MED ORDER — MIDAZOLAM HCL 2 MG/2ML IJ SOLN
1.0000 mg | INTRAMUSCULAR | Status: DC | PRN
  Administered 2024-01-26: 2 mg via INTRAVENOUS
  Filled 2024-01-26: qty 2

## 2024-01-26 MED ORDER — DOCUSATE SODIUM 100 MG PO CAPS: 100.0000 mg | ORAL_CAPSULE | Freq: Two times a day (BID) | ORAL | Status: DC | PRN

## 2024-01-26 MED ORDER — GLYCOPYRROLATE 0.2 MG/ML IJ SOLN
0.2000 mg | INTRAMUSCULAR | Status: DC | PRN
  Filled 2024-01-26: qty 1

## 2024-01-26 MED ORDER — PROPOFOL 1000 MG/100ML IV EMUL
50.0000 ug/kg/min | INTRAVENOUS | Status: DC
  Administered 2024-01-26: 60 ug/kg/min via INTRAVENOUS
  Administered 2024-01-26 (×4): 50 ug/kg/min via INTRAVENOUS
  Administered 2024-01-26: 20 ug/kg/min via INTRAVENOUS
  Administered 2024-01-26 – 2024-01-28 (×14): 50 ug/kg/min via INTRAVENOUS
  Filled 2024-01-26 (×18): qty 100

## 2024-01-26 MED ORDER — FAMOTIDINE 20 MG PO TABS
20.0000 mg | ORAL_TABLET | Freq: Two times a day (BID) | ORAL | Status: DC
  Administered 2024-01-26: 20 mg
  Filled 2024-01-26: qty 1

## 2024-01-26 NOTE — Progress Notes (Signed)
 Pt transported from ED Tr B to CT and back without complications.

## 2024-01-26 NOTE — Consult Note (Signed)
 NEUROLOGY CONSULT NOTE   Date of service: January 26, 2024 Patient Name: Holly Fuentes MRN:  [id number] DOB:  1971/01/26 Chief Complaint: Postcardiac arrest myoclonus Requesting Provider: Arlyne Bering, MD  History of Present Illness  Holly Fuentes is a 53 y.o. female with hx of hypertension, hyperlipidemia, anxiety, depression, heart failure with reduced ejection fraction-brought into the emergency department after cardiac arrest.  EMS was called to the scene with patient found down by significant other.  Per the ED notes review, she presumably seemed to drowsy before bed and was sitting in the chair and then fell forward and started vomiting profusely.  The significant other tried to clear the vomit and helped her with breathing and called 911.  He was advised to start CPR which he attempted.  EMS arrived on the scene and found the patient to be pulseless.  They started CPR, obtained airway and gave a single epi.  There was also concern of opiate overuse.  Significant other had given her intramuscular Narcan prior to EMS arrival. Per the ED RN documentation-EMS arrival was 0002 hrs., at that time patient was pulseless.  ROSC was obtained at 0013 hrs. downtime prior to EMS arrival was about 10 minutes or maybe even more-unclear at this time. She was admitted to the ICU and started exhibiting myoclonic jerking.  CT head was obtained in the ED that revealed diffuse cerebral edema.  Neurology was consulted for the above findings     ROS  Unable to ascertain due to patient's mentation  Past History   Past Medical History:  Diagnosis Date   Abnormal chest CT    Anxiety    CHF (congestive heart failure) (HCC)    COPD (chronic obstructive pulmonary disease) (HCC)    Depression    HFrEF (heart failure with reduced ejection fraction) (HCC)    a.EF 35-40% by echo in 03/2021 b. echo in 12/2021 showing EF had improved to 65-70%   HLD (hyperlipidemia)    Hypertension    PTSD (post-traumatic  stress disorder)    Tobacco abuse     Past Surgical History:  Procedure Laterality Date   BACK SURGERY     CESAREAN SECTION      Family History: Family History  Problem Relation Age of Onset   Hypertension Mother    Cancer Maternal Grandmother    Heart disease Maternal Grandmother    Heart disease Maternal Grandfather     Social History  reports that she quit smoking about 3 years ago. Her smoking use included cigarettes. She has never used smokeless tobacco. She reports that she does not drink alcohol and does not use drugs.  Allergies  Allergen Reactions   Cranberry Hives   Hydrocodone-Acetaminophen  Other (See Comments)    Reaction : migraines    Medications   Current Facility-Administered Medications:    0.9 %  sodium chloride  infusion, 250 mL, Intravenous, Continuous, Ogan, Okoronkwo U, MD, Last Rate: 10 mL/hr at 01/26/24 0513, 250 mL at 01/26/24 0513   Chlorhexidine Gluconate Cloth 2 % PADS 6 each, 6 each, Topical, Daily, Albustami, Omar M, MD, 6 each at 01/26/24 0345   docusate sodium (COLACE) capsule 100 mg, 100 mg, Oral, BID PRN, Albustami, Linna Richard, MD   enoxaparin  (LOVENOX ) injection 40 mg, 40 mg, Subcutaneous, Daily, Albustami, Omar M, MD   famotidine (PEPCID) IVPB 20 mg premix, 20 mg, Intravenous, Daily, Albustami, Omar M, MD   fentaNYL in NS (21mcg/ml) infusion-PREMIX, 0-400 mcg/hr, Intravenous, Continuous, Long, Joshua G,  MD, Last Rate: 15 mL/hr at 02-11-2024 0310, 150 mcg/hr at 02/11/24 0310   insulin  aspart (novoLOG ) injection 0-15 Units, 0-15 Units, Subcutaneous, Q4H, Ogan, Okoronkwo U, MD   ipratropium-albuterol  (DUONEB) 0.5-2.5 (3) MG/3ML nebulizer solution 3 mL, 3 mL, Nebulization, QID, Albustami, Omar M, MD   midazolam (VERSED) injection 1-2 mg, 1-2 mg, Intravenous, Q1H PRN, Ogan, Okoronkwo U, MD, 2 mg at February 11, 2024 0514   Oral care mouth rinse, 15 mL, Mouth Rinse, Q2H, Albustami, Linna Richard, MD, 15 mL at 11-Feb-2024 2956   Oral care mouth rinse, 15 mL,  Mouth Rinse, PRN, Albustami, Omar M, MD   phenylephrine (NEO-SYNEPHRINE) 20mg /NS 250mL premix infusion, 25-200 mcg/min, Intravenous, Titrated, Ogan, Okoronkwo U, MD, Last Rate: 18.75 mL/hr at 02/11/2024 0510, 25 mcg/min at 02-11-24 0510   piperacillin-tazobactam (ZOSYN) IVPB 3.375 g, 3.375 g, Intravenous, Q8H, Albustami, Linna Richard, MD   polyethylene glycol (MIRALAX  / GLYCOLAX ) packet 17 g, 17 g, Oral, Daily PRN, Albustami, Linna Richard, MD   propofol (DIPRIVAN) 1000 MG/100ML infusion, 0-80 mcg/kg/min, Intravenous, Continuous, Long, Shereen Dike, MD, Last Rate: 15 mL/hr at 02-11-2024 0445, 25 mcg/kg/min at 02-11-24 0445  Vitals   Vitals:   2024-02-11 0409 02/11/2024 0436 02-11-2024 0439 02-11-2024 0450  BP: (!) 154/137 (!) 72/51 91/72   Pulse: 92  94   Resp: 16 15 16    Temp: (!) 96.3 F (35.7 C) (!) 96.6 F (35.9 C) (!) 96.8 F (36 C)   TempSrc:      SpO2: 95%  100% 99%  Weight:        Body mass index is 42.29 kg/m.   Physical Exam  General: Sedated with propofol, intubated HEENT: Normocephalic dermatic Lungs: Vented Neurological exam Sedated intubated On propofol 25-still having whole body myoclonic jerks. Received 2 mg of Versed right before the exam.  During the exam, the frequency of the myoclonic jerks reduced somewhat but they were still elicitable with stimulation. Nonverbal and nonresponsive to noxious stimulation. Cranial nerves: Pupils are pinpoint and minimally reactive to light, gaze is upwards pointing, facial symmetry difficult to ascertain. Motor examination with no movement in any of the 4 extremities spontaneously Sensory exam: No movement to noxious stimulation but she does have whole-body myoclonic jerking with any kind of stimulation Breathing over the ventilator Absent corneals   Labs/Imaging/Neurodiagnostic studies   CBC:  Recent Labs  Lab 02-11-2024 0151 02-11-24 0422 02/11/2024 0501  WBC 23.4* 11.7*  --   NEUTROABS 8.0*  --   --   HGB 14.0 14.4 14.6  HCT 45.9 40.2 43.0   MCV 99.1 100.0  --   PLT 357 280  --    Basic Metabolic Panel:  Lab Results  Component Value Date   NA 143 02-11-2024   K 4.4 2024/02/11   CO2 18 (L) 2024/02/11   GLUCOSE 256 (H) Feb 11, 2024   BUN 14 02/11/24   CREATININE 1.22 (H) 2024-02-11   CALCIUM  8.2 (L) 2024-02-11   GFRNONAA 54 (L) Feb 11, 2024   GFRAA >90 10/31/2013   HgbA1c:  Lab Results  Component Value Date   HGBA1C 5.7 (H) 02/11/2024   Urine Drug Screen:     Component Value Date/Time   LABOPIA NONE DETECTED 02/11/24 0152   COCAINSCRNUR POSITIVE (A) 02-11-24 0152   LABBENZ NONE DETECTED 11-Feb-2024 0152   AMPHETMU NONE DETECTED February 11, 2024 0152   THCU NONE DETECTED 2024-02-11 0152   LABBARB NONE DETECTED February 11, 2024 0152    Alcohol Level     Component Value Date/Time   ETH <15 2024/02/11 0151  INR  Lab Results  Component Value Date   INR 1.2 03/16/2021   APTT  Lab Results  Component Value Date   APTT 25 03/16/2021   CT Head without contrast(Personally reviewed): Significant loss of normal sulcal markings and basilar cisterns consistent with cerebral edema.  Lateral ventricles appear small.  No acute hemorrhage  ASSESSMENT   Holly Fuentes is a 53 y.o. female with above past medical history presenting after cardiac arrest and started exhibiting myoclonic jerking. On examination, she has minimal brainstem reflexes. CT head reveals diffuse cerebral edema. Downtime unclear but was probably greater than 20 minutes total. Urinary toxicology screen is positive for cocaine.  Her clinical exam and imaging portend very poor prognosis for neurological meaningful recovery-I suspect she will progress to brain death in the next few hours to days.  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  Increase the dose to 50 mcg/kg/min after 1 bolus of 50 Use Versed as needed Loaded with Keppra 3 g IV x 1.  Start Keppra 500 twice  daily. If she continues to have myoclonic jerking, consider adding Depakote and then Klonopin . EEG in the morning As said above, likely will progress to brain death given already diffuse cerebral edema.  Plan was discussed with Dr. Ogan  ______________________________________________________________________    Signed, Tona Francis, MD [facility]   CRITICAL CARE ATTESTATION Performed by: Tona Francis, MD Total critical care time: 40 minutes Critical care time was exclusive of separately billable procedures and treating other patients and/or supervising APPs/Residents/Students Critical care was necessary to treat or prevent imminent or life-threatening deterioration. This patient is critically ill and at significant risk for neurological worsening and/or death and care requires constant monitoring. Critical care was time spent personally by me on the following activities: development of treatment plan with patient and/or surrogate as well as nursing, discussions with consultants, evaluation of patient's response to treatment, examination of patient, obtaining history from patient or surrogate, ordering and performing treatments and interventions, ordering and review of laboratory studies, ordering and review of radiographic studies, pulse oximetry, re-evaluation of patient's condition, participation in multidisciplinary rounds and medical decision making of high complexity in the care of this patient.

## 2024-01-26 NOTE — Progress Notes (Signed)
 Patient transported to CT and back to 3M06 without event.

## 2024-01-26 NOTE — Progress Notes (Signed)
 Routine EEG completed, results pending Neurology review and interpretation

## 2024-01-26 NOTE — ED Notes (Signed)
 Patient transported to CT

## 2024-01-26 NOTE — Progress Notes (Signed)
 Pt transitioned to comfort care hours ago, just had been awaiting family readiness for extubation but otherwise had ceased non-comfort interventions.   Since this transition, HB has approached family and have asked that we resume previously discontinued non-comfort interventions.    Will place requisite orders and await further notification.    Eston Hence MSN, AGACNP-BC [facility] Pulmonary/Critical Care Medicine 01/26/2024, 5:07 PM

## 2024-01-26 NOTE — Progress Notes (Signed)
  Echocardiogram 2D Echocardiogram has been performed.  Royden Corin 01/26/2024, 9:27 AM

## 2024-01-26 NOTE — ED Notes (Signed)
 Chest xray and KUB performed at

## 2024-01-26 NOTE — ED Triage Notes (Addendum)
 Patient BIB [facility] from home after CPR. Patient found down by husband after using fentanyl. Unknown downtime. Husband gave 2 doses of IM Narcan. EMS has no pulse upon arrival at 0002. EMS got rosc at 0013 and 1 epi was given. Patient received 1000 mL fluids. IO in the right tibia.

## 2024-01-26 NOTE — Progress Notes (Signed)
   01/26/24 1600  Spiritual Encounters  Type of Visit Initial  Care provided to: Pt and family  Referral source Family  Reason for visit End-of-life  OnCall Visit No   Chaplain responded to page for EOL.  Chaplain was received by family and prayed according to their faith.  Family expressed gratitude and is open to continued spiritual support as the need arises.

## 2024-01-26 NOTE — Plan of Care (Signed)
  Problem: Clinical Measurements: Goal: Respiratory complications will improve Outcome: Not Progressing Goal: Cardiovascular complication will be avoided Outcome: Progressing   Problem: Clinical Measurements: Goal: Cardiovascular complication will be avoided Outcome: Progressing   Problem: Elimination: Goal: Will not experience complications related to urinary retention Outcome: Progressing   Problem: Safety: Goal: Ability to remain free from injury will improve Outcome: Progressing

## 2024-01-26 NOTE — ED Notes (Addendum)
 Prop started at 20 mcg @ 0111  Prop increased to 30 mcg @0125   Prop bolus @ 0130 with verbal orders from Dr. Dolan Freiberg  0145: BP 98/64 to which prop was decreased to 20mcg.

## 2024-01-26 NOTE — Procedures (Addendum)
 Patient Name: Holly Fuentes  MRN: [id number]  Epilepsy Attending: Arleene Lack  Referring Physician/Provider: Kimberley Penman, MD  Date: 01/26/2024 Duration: 32.34 mins  Patient history: 53 year old female status post cardiac arrest.  EEG to evaluate for seizure  Level of alertness: comatose  AEDs during EEG study: Propofol, Versed  Technical aspects: This EEG study was done with scalp electrodes positioned according to the 10-20 International system of electrode placement. Electrical activity was reviewed with band pass filter of 1-70Hz , sensitivity of 7 uV/mm, display speed of 6mm/sec with a 60Hz  notched filter applied as appropriate. EEG data were recorded continuously and digitally stored.  Video monitoring was available and reviewed as appropriate.  Description: EEG showed burst suppression pattern with bursts of highly epileptiform discharges lasting 1 second alternating with generalized suppression lasting 2 to 40 seconds to hyperventilation and photic stimulation were not performed.     ABNORMALITY - Burst suppression with highly epileptiform bursts, generalized  IMPRESSION: This study showed evidence of epileptogenicity with generalized onset and high risk of seizures.  Additionally there is profound diffuse encephalopathy.  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  Dr. Felipe Horton was notified.  Unnamed Zeien O Breaker Springer

## 2024-01-26 NOTE — ED Provider Notes (Signed)
 Emergency Department Provider Note   I have reviewed the triage vital signs and the nursing notes.   HISTORY  Chief Complaint Post CPR   HPI Holly Fuentes is a 53 y.o. female past history reviewed below presents to the emergency department after cardiac arrest.  EMS were called to the scene with patient down and by her significant other.  He tells me that before bed she was sitting in a chair and seemed to be very drowsy.  She fell forward and vomiting profusely.  He tried to clear the vomit and help her bleed.  He called 911 and was advised to start CPR, which he attempted.  EMS arrived on scene to find the patient without a pulse. They started CPR, inserted an I-Gel airway, and gave a single Epi.  They did not give Narcan as the husband had given intramuscular Narcan prior to their arrival after being given this by EMS from a prior visit.  She does use opiates and has had overdose in the past.   Level 5 caveat: Patient unresponsive.    Past Medical History:  Diagnosis Date   Abnormal chest CT    Anxiety    CHF (congestive heart failure) (HCC)    COPD (chronic obstructive pulmonary disease) (HCC)    Depression    HFrEF (heart failure with reduced ejection fraction) (HCC)    a.EF 35-40% by echo in 03/2021 b. echo in 12/2021 showing EF had improved to 65-70%   HLD (hyperlipidemia)    Hypertension    PTSD (post-traumatic stress disorder)    Tobacco abuse     Review of Systems  Level 5 caveat: Unresponsive  ____________________________________________   PHYSICAL EXAM:  VITAL SIGNS: Vitals:   01/27/24 0030 01/27/24 0045  BP: 135/71 131/70  Pulse: 90 89  Resp: (!) 24 (!) 24  Temp:    SpO2: 97% 96%   Constitutional: Unresponsive with I gel airway in place. Vomitus noted. No blood.  Eyes: Conjunctivae are normal. Pupils are 3mm and sluggish.  Head: Atraumatic. Nose: No congestion/rhinnorhea. Mouth/Throat: Mucous membranes are moist.  Oropharynx  non-erythematous. Neck: No stridor.   Cardiovascular: Tachycardia. Good peripheral circulation. Grossly normal heart sounds.   Respiratory: Agonal respiratory effort.  No retractions. Lungs CTAB. Gastrointestinal: Soft. Positive distention.  Musculoskeletal: No gross deformities of extremities. Neurologic: GCS 3.  Skin:  Skin is warm, dry and intact. No rash noted.  ____________________________________________   LABS (all labs ordered are listed, but only abnormal results are displayed)  Labs Reviewed  COMPREHENSIVE METABOLIC PANEL WITH GFR - Abnormal; Notable for the following components:      Result Value   CO2 18 (*)    Glucose, Bld 256 (*)    Creatinine, Ser 1.22 (*)    Calcium  8.2 (*)    Albumin 3.2 (*)    AST 81 (*)    ALT 46 (*)    GFR, Estimated 54 (*)    Anion gap 16 (*)    All other components within normal limits  CBC WITH DIFFERENTIAL/PLATELET - Abnormal; Notable for the following components:   WBC 23.4 (*)    Neutro Abs 8.0 (*)    Lymphs Abs 13.2 (*)    Monocytes Absolute 1.2 (*)    Basophils Absolute 0.2 (*)    Abs Immature Granulocytes 0.41 (*)    All other components within normal limits  RAPID URINE DRUG SCREEN, HOSP PERFORMED - Abnormal; Notable for the following components:   Cocaine POSITIVE (*)  All other components within normal limits  PHOSPHORUS - Abnormal; Notable for the following components:   Phosphorus 5.0 (*)    All other components within normal limits  CBC - Abnormal; Notable for the following components:   WBC 11.7 (*)    MCH 35.8 (*)    All other components within normal limits  HEMOGLOBIN A1C - Abnormal; Notable for the following components:   Hgb A1c MFr Bld 5.7 (*)    All other components within normal limits  GLUCOSE, CAPILLARY - Abnormal; Notable for the following components:   Glucose-Capillary 113 (*)    All other components within normal limits  GLUCOSE, CAPILLARY - Abnormal; Notable for the following components:    Glucose-Capillary 118 (*)    All other components within normal limits  GLUCOSE, CAPILLARY - Abnormal; Notable for the following components:   Glucose-Capillary 131 (*)    All other components within normal limits  URINALYSIS, ROUTINE W REFLEX MICROSCOPIC - Abnormal; Notable for the following components:   Color, Urine AMBER (*)    APPearance CLOUDY (*)    Ketones, ur 5 (*)    Protein, ur 30 (*)    Bacteria, UA RARE (*)    All other components within normal limits  GLUCOSE, CAPILLARY - Abnormal; Notable for the following components:   Glucose-Capillary 120 (*)    All other components within normal limits  BASIC METABOLIC PANEL WITH GFR - Abnormal; Notable for the following components:   Glucose, Bld 129 (*)    Creatinine, Ser 1.18 (*)    Calcium  8.2 (*)    GFR, Estimated 56 (*)    All other components within normal limits  PHOSPHORUS - Abnormal; Notable for the following components:   Phosphorus 4.8 (*)    All other components within normal limits  GLUCOSE, CAPILLARY - Abnormal; Notable for the following components:   Glucose-Capillary 128 (*)    All other components within normal limits  URINALYSIS, ROUTINE W REFLEX MICROSCOPIC - Abnormal; Notable for the following components:   APPearance TURBID (*)    Ketones, ur 5 (*)    All other components within normal limits  CBC - Abnormal; Notable for the following components:   WBC 16.0 (*)    All other components within normal limits  COMPREHENSIVE METABOLIC PANEL WITH GFR - Abnormal; Notable for the following components:   Glucose, Bld 113 (*)    BUN 23 (*)    Creatinine, Ser 1.03 (*)    Calcium  8.0 (*)    Total Protein 6.4 (*)    Albumin 2.8 (*)    AST 47 (*)    All other components within normal limits  PHOSPHORUS - Abnormal; Notable for the following components:   Phosphorus 4.9 (*)    All other components within normal limits  FIBRINOGEN - Abnormal; Notable for the following components:   Fibrinogen 641 (*)    All  other components within normal limits  LACTIC ACID, PLASMA - Abnormal; Notable for the following components:   Lactic Acid, Venous 2.1 (*)    All other components within normal limits  AMYLASE - Abnormal; Notable for the following components:   Amylase 21 (*)    All other components within normal limits  GLUCOSE, CAPILLARY - Abnormal; Notable for the following components:   Glucose-Capillary 109 (*)    All other components within normal limits  GLUCOSE, CAPILLARY - Abnormal; Notable for the following components:   Glucose-Capillary 138 (*)    All other components within normal limits  POCT I-STAT 7, (LYTES, BLD GAS, ICA,H+H) - Abnormal; Notable for the following components:   pH, Arterial 7.227 (*)    pCO2 arterial 65.2 (*)    pO2, Arterial 422 (*)    All other components within normal limits  POCT I-STAT 7, (LYTES, BLD GAS, ICA,H+H) - Abnormal; Notable for the following components:   pH, Arterial 7.297 (*)    pCO2 arterial 50.5 (*)    Calcium , Ion 1.14 (*)    All other components within normal limits  TROPONIN I (HIGH SENSITIVITY) - Abnormal; Notable for the following components:   Troponin I (High Sensitivity) 113 (*)    All other components within normal limits  CULTURE, RESPIRATORY W GRAM STAIN  MRSA NEXT GEN BY PCR, NASAL  SARS CORONAVIRUS 2 BY RT PCR  CULTURE, BLOOD (ROUTINE X 2)  CULTURE, BLOOD (ROUTINE X 2)  CULTURE, BLOOD (ROUTINE X 2)  ETHANOL  LIPASE, BLOOD  PATHOLOGIST SMEAR REVIEW  MAGNESIUM   HIV ANTIBODY (ROUTINE TESTING W REFLEX)  MAGNESIUM   BILIRUBIN, DIRECT  PROTIME-INR  APTT  LIPASE, BLOOD  HCG, SERUM, QUALITATIVE  URINALYSIS, ROUTINE W REFLEX MICROSCOPIC  CBC  CBC  COMPREHENSIVE METABOLIC PANEL WITH GFR  COMPREHENSIVE METABOLIC PANEL WITH GFR  MAGNESIUM   MAGNESIUM   PHOSPHORUS  PHOSPHORUS  BILIRUBIN, DIRECT  BILIRUBIN, DIRECT  PROTIME-INR  PROTIME-INR  APTT  APTT  FIBRINOGEN  FIBRINOGEN  BLOOD GAS, ARTERIAL  BLOOD GAS, ARTERIAL  BLOOD  GAS, ARTERIAL  LACTIC ACID, PLASMA  CBC  CBC  COMPREHENSIVE METABOLIC PANEL WITH GFR  COMPREHENSIVE METABOLIC PANEL WITH GFR  MAGNESIUM   MAGNESIUM   PHOSPHORUS  PHOSPHORUS  BILIRUBIN, DIRECT  BILIRUBIN, DIRECT  PROTIME-INR  PROTIME-INR  APTT  APTT  FIBRINOGEN  FIBRINOGEN  BLOOD GAS, ARTERIAL  BLOOD GAS, ARTERIAL  I-STAT CHEM 8, ED  I-STAT CG4 LACTIC ACID, ED  I-STAT CG4 LACTIC ACID, ED  TYPE AND SCREEN  TROPONIN I (HIGH SENSITIVITY)   ____________________________________________  EKG   EKG Interpretation Date/Time:  Thursday January 26 2024 01:58:12 EDT Ventricular Rate:  92 PR Interval:  112 QRS Duration:  92 QT Interval:  404 QTC Calculation: 500 R Axis:   71  Text Interpretation: Sinus rhythm Borderline short PR interval Low voltage, precordial leads Borderline prolonged QT interval Confirmed by Abby Hocking [phone]) on 01/26/2024 2:05:02 AM        ____________________________________________  RADIOLOGY  CT CHEST ABDOMEN PELVIS W CONTRAST Result Date: 01/26/2024 CLINICAL DATA:  History of diffuse cerebral edema and potential organ donation EXAM: CT CHEST, ABDOMEN, AND PELVIS WITH CONTRAST TECHNIQUE: Multidetector CT imaging of the chest, abdomen and pelvis was performed following the standard protocol during bolus administration of intravenous contrast. RADIATION DOSE REDUCTION: This exam was performed according to the departmental dose-optimization program which includes automated exposure control, adjustment of the mA and/or kV according to patient size and/or use of iterative reconstruction technique. CONTRAST:  75mL OMNIPAQUE  IOHEXOL  350 MG/ML SOLN COMPARISON:  None Available. FINDINGS: CT CHEST FINDINGS Cardiovascular: Thoracic aorta shows no aneurysmal dilatation or dissection. Heart is not significantly enlarged in size. Pulmonary artery as visualized is within normal limits. Mediastinum/Nodes: Thoracic inlet is within normal limits. Endotracheal tube and gastric  catheter are seen. No hilar or mediastinal adenopathy is noted. The esophagus is within normal limits. Lungs/Pleura: Lungs demonstrate patchy multifocal opacities which were not well appreciated on recent chest x-ray. No pneumothorax is noted. Musculoskeletal: Degenerative changes of the thoracic spine are noted. Fractures of the right third, fifth and sixth ribs are noted anteriorly  likely related to the recent CPR. CT ABDOMEN PELVIS FINDINGS Hepatobiliary: No focal liver abnormality is seen. No gallstones, gallbladder wall thickening, or biliary dilatation. Pancreas: Unremarkable. No pancreatic ductal dilatation or surrounding inflammatory changes. Spleen: Normal in size without focal abnormality. Adrenals/Urinary Tract: Adrenal glands are within normal limits. Kidneys demonstrate a normal enhancement pattern. No renal calculi or obstructive changes are seen. The bladder is decompressed by Foley catheter. Stomach/Bowel: No obstructive or inflammatory changes of the colon are seen. The appendix is within normal limits. Small bowel and stomach are unremarkable. Vascular/Lymphatic: Aortic atherosclerosis. No enlarged abdominal or pelvic lymph nodes. Reproductive: Uterus and bilateral adnexa are unremarkable. Other: No abdominal wall hernia or abnormality. No abdominopelvic ascites. Musculoskeletal: No acute or significant osseous findings. IMPRESSION: New patchy airspace opacities are noted bilaterally Right-sided rib fractures without pneumothorax. No other focal abnormality is noted. Electronically Signed   By: Violeta Grey M.D.   On: 01/26/2024 23:58   ECHOCARDIOGRAM COMPLETE Result Date: 01/26/2024    ECHOCARDIOGRAM REPORT   Patient Name:   Holly Fuentes Date of Exam: 01/26/2024 Medical Rec #:  [id number]      Height:       63.0 in Accession #:    [id number]     Weight:       238.8 lb Date of Birth:  [birth year]      BSA:          2.085 m Patient Age:    53 years       BP:           101/48 mmHg Patient Gender: F               HR:           95 bpm. Exam Location:  Inpatient Procedure: 2D Echo, Cardiac Doppler, Color Doppler and Intracardiac            Opacification Agent (Both Spectral and Color Flow Doppler were            utilized during procedure). Indications:    R94.31 Abnormal EKG. Cardiac arrest.  History:        Patient has prior history of Echocardiogram examinations, most                 recent 01/03/2022. CHF, COPD; Arrythmias:Cardiac Arrest.                 Polysubstance abuse.  Sonographer:    Raynelle Callow RDCS Referring Phys: [id number] OMAR Macario Savin  Sonographer Comments: Technically difficult study due to poor echo windows, patient is obese and echo performed with patient supine and on artificial respirator. IMPRESSIONS  1. Left ventricular ejection fraction, by estimation, is 55 to 60%. The left ventricle has normal function. The left ventricle demonstrates regional wall motion abnormalities (see scoring diagram/findings for description). The left ventricular internal cavity size was mildly dilated. Left ventricular diastolic parameters are indeterminate. There is hypokinesis of the left ventricular, mid inferolateral wall and anterolateral wall.  2. Right ventricular systolic function is normal. The right ventricular size is normal.  3. The mitral valve is grossly normal. No evidence of mitral valve regurgitation. No evidence of mitral stenosis.  4. The aortic valve was not well visualized. Aortic valve regurgitation is not visualized. No aortic stenosis is present.  5. The inferior vena cava is normal in size with greater than 50% respiratory variability, suggesting right atrial pressure of 3 mmHg. Comparison(s): Changes from prior study are noted. Conclusion(s)/Recommendation(s): Overall normal LVEF,  new focal wall motion abnormality in mid lateral wall. Findings communicated to Dr. Felipe Horton. FINDINGS  Left Ventricle: Left ventricular ejection fraction, by estimation, is 55 to 60%. The left ventricle has normal  function. The left ventricle demonstrates regional wall motion abnormalities. Definity  contrast agent was given IV to delineate the left ventricular endocardial borders. The left ventricular internal cavity size was mildly dilated. There is no left ventricular hypertrophy. Left ventricular diastolic parameters are indeterminate. Right Ventricle: The right ventricular size is normal. No increase in right ventricular wall thickness. Right ventricular systolic function is normal. Left Atrium: Left atrial size was normal in size. Right Atrium: Right atrial size was normal in size. Pericardium: There is no evidence of pericardial effusion. Mitral Valve: The mitral valve is grossly normal. No evidence of mitral valve regurgitation. No evidence of mitral valve stenosis. Tricuspid Valve: The tricuspid valve is grossly normal. Tricuspid valve regurgitation is not demonstrated. No evidence of tricuspid stenosis. Aortic Valve: The aortic valve was not well visualized. Aortic valve regurgitation is not visualized. No aortic stenosis is present. Pulmonic Valve: The pulmonic valve was not well visualized. Pulmonic valve regurgitation is not visualized. No evidence of pulmonic stenosis. Aorta: The aortic root is normal in size and structure and the ascending aorta was not well visualized. Venous: The inferior vena cava is normal in size with greater than 50% respiratory variability, suggesting right atrial pressure of 3 mmHg. IAS/Shunts: No atrial level shunt detected by color flow Doppler.  LEFT VENTRICLE PLAX 2D LVIDd:         4.20 cm      Diastology LVIDs:         2.80 cm      LV e' medial:    6.42 cm/s LV PW:         1.00 cm      LV E/e' medial:  15.2 LV IVS:        0.90 cm      LV e' lateral:   9.79 cm/s LVOT diam:     2.20 cm      LV E/e' lateral: 10.0 LV SV:         92 LV SV Index:   44 LVOT Area:     3.80 cm  LV Volumes (MOD) LV vol d, MOD A2C: 109.4 ml LV vol d, MOD A4C: 121.5 ml LV vol s, MOD A2C: 52.8 ml LV vol s, MOD  A4C: 54.3 ml LV SV MOD A2C:     56.6 ml LV SV MOD A4C:     121.5 ml LV SV MOD BP:      63.6 ml RIGHT VENTRICLE             IVC RV S prime:     10.40 cm/s  IVC diam: 1.40 cm TAPSE (M-mode): 2.1 cm LEFT ATRIUM             Index        RIGHT ATRIUM           Index LA diam:        3.20 cm 1.53 cm/m   RA Area:     10.40 cm LA Vol (A2C):   25.5 ml 12.23 ml/m  RA Volume:   21.70 ml  10.41 ml/m LA Vol (A4C):   22.1 ml 10.60 ml/m LA Biplane Vol: 24.2 ml 11.61 ml/m  AORTIC VALVE LVOT Vmax:   125.00 cm/s LVOT Vmean:  86.200 cm/s LVOT VTI:    0.243 m  AORTA Ao  Asc diam: 3.50 cm MITRAL VALVE MV Area (PHT): 4.21 cm     SHUNTS MV Decel Time: 180 msec     Systemic VTI:  0.24 m MV E velocity: 97.90 cm/s   Systemic Diam: 2.20 cm MV A velocity: 113.00 cm/s MV E/A ratio:  0.87 Sheryle Donning MD Electronically signed by Sheryle Donning MD Signature Date/Time: 01/26/2024/11:30:12 AM    Final    EEG adult Result Date: 01/26/2024 Arleene Lack, MD     01/26/2024 12:30 PM Patient Name: BIANKA LIBERATI MRN: [id number] Epilepsy Attending: Arleene Lack Referring Physician/Provider: Kimberley Penman, MD Date: 01/26/2024 Duration: 32.34 mins Patient history: 53 year old female status post cardiac arrest.  EEG to evaluate for seizure Level of alertness: comatose AEDs during EEG study: Propofol , Versed  Technical aspects: This EEG study was done with scalp electrodes positioned according to the 10-20 International system of electrode placement. Electrical activity was reviewed with band pass filter of 1-70Hz , sensitivity of 7 uV/mm, display speed of 54mm/sec with a 60Hz  notched filter applied as appropriate. EEG data were recorded continuously and digitally stored.  Video monitoring was available and reviewed as appropriate. Description: EEG showed burst suppression pattern with bursts of highly epileptiform discharges lasting 1 second alternating with generalized suppression lasting 2 to 40 seconds to hyperventilation and  photic stimulation were not performed.   ABNORMALITY - Burst suppression with highly epileptiform bursts, generalized IMPRESSION: This study showed evidence of epileptogenicity with generalized onset and high risk of seizures.  Additionally there is profound diffuse encephalopathy.  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 Dr. Felipe Horton was notified. Arleene Lack   CT Head Wo Contrast Result Date: 01/26/2024 CLINICAL DATA:  Found unresponsive EXAM: CT HEAD WITHOUT CONTRAST TECHNIQUE: Contiguous axial images were obtained from the base of the skull through the vertex without intravenous contrast. RADIATION DOSE REDUCTION: This exam was performed according to the departmental dose-optimization program which includes automated exposure control, adjustment of the mA and/or kV according to patient size and/or use of iterative reconstruction technique. COMPARISON:  None Available. FINDINGS: Brain: Loss of the normal sulcal markings and basilar cisterns is seen. This is consistent with diffuse cerebral edema. The lateral ventricles are small. No acute hemorrhage or mass lesion is seen. Vascular: No hyperdense vessel or unexpected calcification. Skull: Normal. Negative for fracture or focal lesion. Sinuses/Orbits: Mucosal thickening is noted in the left maxillary antrum. Other: None. IMPRESSION: Changes consistent with diffuse cerebral edema Critical Value/emergent results were called by telephone at the time of interpretation on 01/26/2024 at 2:35 am to Dr. Abby Hocking , who verbally acknowledged these results. Electronically Signed   By: Violeta Grey M.D.   On: 01/26/2024 02:36   DG Chest Portable 1 View Result Date: 01/26/2024 CLINICAL DATA:  Status post intubation EXAM: PORTABLE CHEST 1 VIEW COMPARISON:  01/02/2022 FINDINGS: Endotracheal tube is noted at the level of the carina directed towards right mainstem  bronchus. This should be withdrawn 2-3 cm. Cardiac shadow is within normal limits. The lungs are well aerated without focal infiltrate. No bony abnormality is noted. IMPRESSION: Endotracheal tube is noted at the level of the carina directed towards right mainstem bronchus. This should be withdrawn 2-3 cm. Electronically Signed   By: Violeta Grey M.D.   On: 01/26/2024 02:11   DG Abdomen 1 View Result Date: 01/26/2024 CLINICAL DATA:  Check gastric catheter placement EXAM: ABDOMEN - 1 VIEW COMPARISON:  None Available. FINDINGS: Scattered large and small bowel gas is noted. Gastric catheter is noted in the distal stomach. No free air is seen. IMPRESSION: Gastric catheter within the stomach. Electronically Signed   By: Violeta Grey M.D.   On: 01/26/2024 02:09    ____________________________________________   PROCEDURES  Procedure(s) performed:   Procedure Name: Intubation Date/Time: 01/26/2024 2:05 AM  Performed by: Roberts Ching, MDPre-anesthesia Checklist: Patient identified, Patient being monitored, Emergency Drugs available, Timeout performed and Suction available Oxygen Delivery Method: Non-rebreather mask Preoxygenation: Pre-oxygenation with 100% oxygen Ventilation: Oral airway inserted - appropriate to patient size Laryngoscope Size: Glidescope and 3 Tube size: 7.5 mm Number of attempts: 1 Airway Equipment and Method: Video-laryngoscopy Placement Confirmation: ETT inserted through vocal cords under direct vision, CO2 detector and Breath sounds checked- equal and bilateral Secured at: 25 cm Tube secured with: ETT holder Dental Injury: Teeth and Oropharynx as per pre-operative assessment     .Critical Care  Performed by: Roberts Ching, MD Authorized by: Roberts Ching, MD   Critical care provider statement:    Critical care time (minutes):  45   Critical care time was exclusive of:  Separately billable procedures and treating other patients and teaching time   Critical care was  necessary to treat or prevent imminent or life-threatening deterioration of the following conditions:  Respiratory failure   Critical care was time spent personally by me on the following activities:  Development of treatment plan with patient or surrogate, discussions with consultants, evaluation of patient's response to treatment, examination of patient, ordering and review of laboratory studies, ordering and review of radiographic studies, ordering and performing treatments and interventions, pulse oximetry, re-evaluation of patient's condition and review of old charts   I assumed direction of critical care for this patient from another provider in my specialty: no     Care discussed with: admitting provider      ____________________________________________   INITIAL IMPRESSION / ASSESSMENT AND PLAN / ED COURSE  Pertinent labs & imaging results that were available during my care of the patient were reviewed by me and considered in my medical decision making (see chart for details).   This patient is Presenting for Evaluation of unresponsive, which does require a range of treatment options, and is a complaint that involves a high risk of morbidity and mortality.  The Differential Diagnoses includes but is not exclusive to acute coronary syndrome, aortic dissection, pulmonary embolism, cardiac tamponade, community-acquired pneumonia, pericarditis, musculoskeletal chest wall pain, etc.   Critical Interventions-    Medications  propofol  (DIPRIVAN ) 1000 MG/100ML infusion (50 mcg/kg/min  100 kg Intravenous New Bag/Given 01/26/24 2153)  Chlorhexidine  Gluconate Cloth 2 % PADS 6 each (6 each Topical Given by Other 01/26/24 1003)  Oral care mouth rinse (15 mLs Mouth Rinse Given 01/27/24 0049)  Oral care mouth rinse (has no administration in time range)  midazolam  (VERSED ) injection 1-2 mg (2 mg Intravenous Given 01/26/24 0514)  0.9 %  sodium chloride  infusion ( Intravenous Stopped 01/26/24 0556)   levETIRAcetam  (KEPPRA ) undiluted injection 500 mg (500 mg Intravenous Given 01/26/24 2132)  perflutren  lipid microspheres (DEFINITY ) IV suspension (2 mLs Intravenous Given 01/26/24 0927)  glycopyrrolate  (ROBINUL ) tablet 1 mg (has no administration in time range)    Or  glycopyrrolate  (ROBINUL ) injection 0.2 mg (has no administration in time range)    Or  glycopyrrolate  (ROBINUL ) injection 0.2 mg (has no administration in time range)  artificial tears ophthalmic solution 1 drop (has no administration in time  range)  morphine  bolus via infusion 5 mg (has no administration in time range)  morphine  100mg  in NS (1mg /mL) infusion - premix (5 mg/hr Intravenous New Bag/Given 01/26/24 1449)  midazolam  (VERSED ) injection 2-4 mg (has no administration in time range)  insulin  aspart (novoLOG ) injection 0-15 Units (2 Units Subcutaneous Given 01/27/24 0050)  pantoprazole  (PROTONIX ) injection 40 mg (has no administration in time range)  lactated ringers  infusion ( Intravenous Rate/Dose Change 01/26/24 1821)  enoxaparin  (LOVENOX ) injection 40 mg (has no administration in time range)  polyethylene glycol (MIRALAX  / GLYCOLAX ) packet 17 g (has no administration in time range)  ipratropium-albuterol  (DUONEB) 0.5-2.5 (3) MG/3ML nebulizer solution 3 mL (has no administration in time range)  piperacillin -tazobactam (ZOSYN ) IVPB 3.375 g (3.375 g Intravenous New Bag/Given 01/26/24 1808)  acetaminophen  (TYLENOL ) 160 MG/5ML solution 650 mg (has no administration in time range)  norepinephrine  (LEVOPHED ) 4mg  in (0.016 mg/mL) premix infusion (4 mcg/min Intravenous New Bag/Given 01/26/24 2203)  Ampicillin -Sulbactam (UNASYN ) 3 g in sodium chloride  0.9 % 100 mL IVPB (0 g Intravenous Stopped 01/26/24 0245)  levETIRAcetam  (KEPPRA ) undiluted injection 3,000 mg (3,000 mg Intravenous Given 01/26/24 0547)  lactated ringers  bolus 1,000 mL (0 mLs Intravenous Stopped 01/26/24 1100)  magnesium  sulfate IVPB 2 g 50 mL (2 g  Intravenous New Bag/Given 01/26/24 2210)  lactated ringers  bolus 500 mL (500 mLs Intravenous New Bag/Given 01/26/24 2231)  iohexol  (OMNIPAQUE ) 350 MG/ML injection 75 mL (75 mLs Intravenous Contrast Given 01/26/24 2345)    Reassessment after intervention: Well sedated on vent.    I did obtain Additional Historical Information from [place] and EMS/Police, as the patient is unresponsive.   Clinical Laboratory Tests Ordered, included ABG on arrival shows respiratory acidosis.  No acute kidney injury.  UDS positive for cocaine.  Radiologic Tests Ordered, included CT head, CXR, and abd XR. I independently interpreted the images and agree with radiology interpretation.   Cardiac Monitor Tracing which shows sinus tachycardia.   Social Determinants of Health Risk patient with history of opiate OD.  Consult complete with ICU. Plan for admit.    Medical Decision Making: Summary:  Patient arrives post CPR with pulses and supraglottic airway in place by EMS.  Patient is hypertensive with agonal respirations.  I exchanged to the supraglottic airway with an ET tube without difficulty.  Patient taken to CT for CT head and labs during epic downtime.  I did not see any obvious large bleeding on my assessment of the CT head.  EKG shows no evidence of acute ischemia.  Will add Unasyn  with concern for aspiration given history from the significant other.  Reevaluation with update and discussion with patient's family. Updated on results and plan for ICU admit.   Patient's presentation is most consistent with acute presentation with potential threat to life or bodily function.   Disposition: admit  ____________________________________________  FINAL CLINICAL IMPRESSION(S) / ED DIAGNOSES  Final diagnoses:  Respiratory arrest (HCC)    Note:  This document was prepared using Dragon voice recognition software and may include unintentional dictation errors.  Abby Hocking, MD, [facility] Emergency Medicine    Ajna Moors,  Shereen Dike, MD 01/27/24 [phone]

## 2024-01-26 NOTE — Progress Notes (Signed)
 Critical ABG given to MD.

## 2024-01-26 NOTE — Procedures (Signed)
 Arterial Catheter Insertion Procedure Note  Holly Fuentes  [id number]  July 05, 1971  Date:01/26/24  Time:10:08 PM    Provider Performing: Lemmie Pyo    Procedure: Insertion of Arterial Line (13244) with US  guidance (01027)   Indication(s) Blood pressure monitoring and/or need for frequent ABGs  Consent Risks of the procedure as well as the alternatives and risks of each were explained to the patient and/or caregiver.  Consent for the procedure was obtained and is signed in the bedside chart  Anesthesia None   Time Out Verified patient identification, verified procedure, site/side was marked, verified correct patient position, special equipment/implants available, medications/allergies/relevant history reviewed, required imaging and test results available.   Sterile Technique Maximal sterile technique including full sterile barrier drape, hand hygiene, sterile gown, sterile gloves, mask, hair covering, sterile ultrasound probe cover (if used).   Procedure Description Area of catheter insertion was cleaned with chlorhexidine and draped in sterile fashion. With real-time ultrasound guidance an arterial catheter was placed into the left radial artery.  Appropriate arterial tracings confirmed on monitor.     Complications/Tolerance None; patient tolerated the procedure well.   EBL Minimal   Specimen(s) None   [name], AGACNP-BC [place] [facility]  See Amion for personal pager PCCM on call pager [phone] until 7pm. Please call Elink 7p-7a. [phone]  01/26/2024 10:09 PM

## 2024-01-26 NOTE — Progress Notes (Signed)
 eLink Physician-Brief Progress Note Patient Name: Holly Fuentes DOB: [birth year] MRN: [id number]   Date of Service  01/26/2024  HPI/Events of Note  Patient with witnessed out of hospital cardiac arrest with CPR and ROSC, post-resuscitation coma, respiratory failure requiring intubation and mechanical ventilation, CT brain with diffuse cerebral edema, and myoclonic seizures.  eICU Interventions  New Patient Evaluation, Neurology consultation. Phenylephrine gtt ordered for BP support and PRN iv Versed ordered for myoclonic seizures, patient is on Propofol.        [name] [name] U [name] [name] 01/26/2024, 4:58 AM

## 2024-01-26 NOTE — H&P (Signed)
 NAME:  Holly Fuentes, MRN:  [id number], DOB:  [birth year], LOS: 0 ADMISSION DATE:  01/26/2024, CONSULTATION DATE:  09/28/2023 REFERRING MD:  Roberts Ching, MD, CHIEF COMPLAINT:  cardiac arrest   History of Present Illness:  A 53 yr old female patient with anxiety, depression, PTSD, COPD, dyslipidemia, HTN, tobacco smoking, opioid abuse, and obesity, who presented to ED after cardiac arrest.  EMS were called to the scene with patient down and by her significant other.  She was sitting in a chair and seemed to be very drowsy.  She fell forward and vomiting profusely.  He tried to clear the vomit and help her breath.  He called 911 and was advised to start CPR, which he attempted.  EMS arrived on scene to find the patient without a pulse. They started CPR, inserted an I-Gel airway, and gave a single Epi.  They did not give Narcan as the husband had given intramuscular Narcan prior to their arrival after being given this by EMS from a prior visit.  She does use opiates and has had overdose in the past.  Hx was taken by chart review and d/w ED MD and staff. The husband has left.   Pertinent  Medical History  anxiety, depression, PTSD, COPD, dyslipidemia, HTN, opioid abuse, obesity   Significant Hospital Events: Including procedures, antibiotic start and stop dates in addition to other pertinent events   Intubated with ETT 7.5 @ 25 cm at the teeth which was retracted to 22 cm. On sedation (Propofol and Fentanyl). She has 3 myoclonic jerks while I was examined her and before per the RN.    Interim History / Subjective:    Objective    Blood pressure 93/64, pulse 92, temperature (!) 95.6 F (35.3 C), resp. rate 19, weight 100 kg, last menstrual period 01/18/2016, SpO2 92%.    FiO2 (%):  [40 %] 40 %   Intake/Output Summary (Last 24 hours) at 01/26/2024 0225 Last data filed at 01/26/2024 0224 Gross per 24 hour  Intake 3.08 ml  Output --  Net 3.08 ml   Filed Weights   01/26/24 0219  Weight: 100  kg    Examination: General: sedated. SpO2 97%. V/S are stable HENT: Pupils are small and not reactive. No LNE or thyromegaly. No JVD Lungs: symmetrical air entry bilaterally. No crackles or wheezing Cardiovascular: NL S1/S2. No m/g/r Abdomen: no distension or tenderness Extremities: no edema. Symmetrical  Neuro: sedated. Myoclonic jerks. Gag reflex is present   Resolved problem list   Assessment and Plan  Cardiac arrest, initial rhythm is assumed PEA or asystole. ROSC 12 min. Anoxic brain injury  -Admit to ICU -Temp goal <37.5 C -Echo -Serial Trop and LA -VAP and DAP protocol -CT head or MRI brain after 72 hrs  -Consider neurology eval  -Glycemic control   Aspiration PNA COPD Tobacco smoking -Zosyn -MRSA screening -Duoneb -Cx  AKI and elevated LFT -Monitor labs -Avoid nephrotoxin meds -I/O chart  Anxiety, depression, PTSD -Hold meds for now  Dyslipidemia -hold statin due to elevated LFT  HTN -Hold meds for now  Opioid abuse/UDS is positive for cocaine  Best Practice (right click and Reselect all SmartList Selections daily)   Diet/type: NPO w/ oral meds DVT prophylaxis LMWH Pressure ulcer(s): N/A GI prophylaxis: H2B Lines: N/A Foley:  Yes, and it is still needed Code Status:  full code Last date of multidisciplinary goals of care discussion []   Labs   CBC: Recent Labs  Lab 01/26/24 0151  WBC  23.4*  NEUTROABS PENDING  HGB 14.0  HCT 45.9  MCV 99.1  PLT 357    Basic Metabolic Panel: No results for input(s): NA, K, CL, CO2, GLUCOSE, BUN, CREATININE, CALCIUM , MG, PHOS in the last 168 hours. GFR: CrCl cannot be calculated (Patient's most recent lab result is older than the maximum 21 days allowed.). Recent Labs  Lab 01/26/24 0151  WBC 23.4*    Liver Function Tests: No results for input(s): AST, ALT, ALKPHOS, BILITOT, PROT, ALBUMIN in the last 168 hours. No results for input(s): LIPASE, AMYLASE in the  last 168 hours. No results for input(s): AMMONIA in the last 168 hours.  ABG No results found for: PHART, PCO2ART, PO2ART, HCO3, TCO2, ACIDBASEDEF, O2SAT   Coagulation Profile: No results for input(s): INR, PROTIME in the last 168 hours.  Cardiac Enzymes: No results for input(s): CKTOTAL, CKMB, CKMBINDEX, TROPONINI in the last 168 hours.  HbA1C: Hgb A1c MFr Bld  Date/Time Value Ref Range Status  01/02/2022 07:13 AM 6.0 (H) 4.8 - 5.6 % Final    Comment:    (NOTE) Pre diabetes:          5.7%-6.4%  Diabetes:              >6.4%  Glycemic control for   <7.0% adults with diabetes   03/16/2021 10:00 AM 6.2 (H) 4.8 - 5.6 % Final    Comment:    (NOTE) Pre diabetes:          5.7%-6.4%  Diabetes:              >6.4%  Glycemic control for   <7.0% adults with diabetes     CBG: No results for input(s): GLUCAP in the last 168 hours.  Review of Systems:   Intubated   Past Medical History:  She,  has a past medical history of Abnormal chest CT, Anxiety, CHF (congestive heart failure) (HCC), COPD (chronic obstructive pulmonary disease) (HCC), Depression, HFrEF (heart failure with reduced ejection fraction) (HCC), HLD (hyperlipidemia), Hypertension, PTSD (post-traumatic stress disorder), and Tobacco abuse.   Surgical History:   Past Surgical History:  Procedure Laterality Date   BACK SURGERY     CESAREAN SECTION       Social History:   reports that she quit smoking about 3 years ago. Her smoking use included cigarettes. She has never used smokeless tobacco. She reports that she does not drink alcohol and does not use drugs.   Family History:  Her family history includes Cancer in her maternal grandmother; Heart disease in her maternal grandfather and maternal grandmother; Hypertension in her mother.   Allergies Allergies  Allergen Reactions   Cranberry Hives   Hydrocodone-Acetaminophen  Other (See Comments)    Reaction : migraines     Home  Medications  Prior to Admission medications   Medication Sig Start Date End Date Taking? Authorizing Provider  albuterol  (VENTOLIN  HFA) 108 (90 Base) MCG/ACT inhaler Inhale 2 puffs into the lungs every 6 (six) hours as needed for wheezing or shortness of breath. 03/19/21   Gwendalyn Lemma, MD  albuterol  (VENTOLIN  HFA) 108 (90 Base) MCG/ACT inhaler Inhale 1 puff into the lungs daily as needed for shortness of breath or wheezing.    [provider]  atorvastatin  (LIPITOR) 20 MG tablet Take 20 mg by mouth daily. 08/24/21   [provider]  budesonide -formoterol  (SYMBICORT ) 80-4.5 MCG/ACT inhaler Inhale 2 puffs into the lungs in the morning and at bedtime. 03/19/21   Gwendalyn Lemma, MD  furosemide  (LASIX ) 20  MG tablet Take 1 tablet (20 mg total) by mouth daily. 03/03/22 03/03/23  Strader, Dimple Francis, PA-C  metoprolol  tartrate (LOPRESSOR ) 25 MG tablet Take 25 mg by mouth 2 (two) times daily. 02/24/22   [provider]  sacubitril -valsartan  (ENTRESTO ) 24-26 MG Take 1 tablet by mouth 2 (two) times daily. 09/24/21   Laurann Pollock, MD  sertraline  (ZOLOFT ) 25 MG tablet Take 25 mg by mouth daily. 05/21/21   [provider]     Critical care time: 60 min   Madelynn Schilder, MD [place] Pager: see AMION

## 2024-01-26 NOTE — Progress Notes (Signed)
 Pt transported from ED Tr B to 3M6 without complications.

## 2024-01-26 NOTE — Progress Notes (Signed)
 MB spoke to Nurse Stana Ear, Pt is not available at this time due to Pt. Is with ECHO. Will check back as schedule permits.

## 2024-01-26 NOTE — IPAL (Addendum)
   Addendum: Met with remaining family Decision to transition to comfort care Adding morphine gtt PRN BZDs Continue propofol 2/2 myoclonus  Extubate when family is ready     Interdisciplinary Goals of Care Family Meeting   Date carried out: 01/26/2024  Location of the meeting: Bedside  Member's involved: Nurse Practitioner, Bedside Registered Nurse, and Family Member or next of kin  Durable Power of Attorney or acting medical decision maker: Mother Holly Fuentes     Discussion: We discussed goals of care for Holly Fuentes .  I met w pt mom, 2 sisters, and close family friend. We talked about her neuro findings. We talked about goals of care and code status.  All in agreement with changing code status to DNR.   They are trying to figure out timing of transitioning to comfort care-- expect a lot of other visitors today and will figure this out as they have time to visit. Cont current tx   Code status:   Code Status: Do not attempt resuscitation (DNR) PRE-ARREST INTERVENTIONS DESIRED   Disposition: Continue current acute care  Time spent for the meeting:    Holly Fetter, NP  01/26/2024, 1:12 PM

## 2024-01-26 NOTE — Progress Notes (Addendum)
 NAME:  Holly Fuentes, MRN:  [id number], DOB:  1970-10-30, LOS: 0 ADMISSION DATE:  01/26/2024, CONSULTATION DATE:  09/28/2023 REFERRING MD:  Roberts Ching, MD, CHIEF COMPLAINT:  cardiac arrest   History of Present Illness:  A 53 yr old female patient with anxiety, depression, PTSD, COPD, dyslipidemia, HTN, tobacco smoking, opioid abuse, and obesity, who presented to ED after cardiac arrest.  EMS were called to the scene with patient down and by her significant other.  She was sitting in a chair and seemed to be very drowsy.  She fell forward and vomiting profusely.  He tried to clear the vomit and help her breath.  He called 911 and was advised to start CPR, which he attempted.  EMS arrived on scene to find the patient without a pulse. They started CPR, inserted an I-Gel airway, and gave a single Epi.  They did not give Narcan as the husband had given intramuscular Narcan prior to their arrival after being given this by EMS from a prior visit.  She does use opiates and has had overdose in the past.  Hx was taken by chart review and d/w ED MD and staff. The husband has left.   Pertinent  Medical History  anxiety, depression, PTSD, COPD, dyslipidemia, HTN, opioid abuse, obesity   Significant Hospital Events: Including procedures, antibiotic start and stop dates in addition to other pertinent events   Intubated with ETT 7.5 @ 25 cm at the teeth which was retracted to 22 cm. On sedation (Propofol and Fentanyl). She has 3 myoclonic jerks while I was examined her and before per the RN.    Interim History / Subjective:  Neuro has seen Getting started on cEEG   Objective    Blood pressure 115/68, pulse 90, temperature 99 F (37.2 C), resp. rate (!) 24, height 5' 3 (1.6 m), weight 107.1 kg, last menstrual period 01/18/2016, SpO2 100%.    Vent Mode: PRVC FiO2 (%):  [40 %-100 %] 60 % Set Rate:  [22 bmp-26 bmp] 24 bmp Vt Set:  [420 mL-470 mL] 420 mL PEEP:  [5 cmH20] 5 cmH20 Plateau Pressure:  [19  cmH20-22 cmH20] 22 cmH20   Intake/Output Summary (Last 24 hours) at 01/26/2024 1125 Last data filed at 01/26/2024 1100 Gross per 24 hour  Intake 2272.95 ml  Output 405 ml  Net 1867.95 ml   Filed Weights   01/26/24 0219 01/26/24 0352 01/26/24 0800  Weight: 100 kg 108.3 kg 107.1 kg    Examination: General: Critically ill obese F NAD HENT: ETT secure anicteric sclera  Lungs: mechanically ventilated  Cardiovascular: rr s1s2 cap refill <3 sec  Abdomen: obese soft + bowel sounds  Extremities: no obvious joint deformity Neuro: sedated. Upward gaze 2mm pupils   Resolved problem list   Assessment and Plan   OOH cardiac arrest Hypotension (sedation related vs shock  state) Anoxic encephalopathy Anoxic brain injury Myoclonus  Cerebral edema Acute respiratory failure w hypoxia and hypercarbia  Aspiration event COPD  Tobacco use AKI Elevated LFTs  Hx anxiety, depression, PTSD  Polysubstance use HLD  Hx HTN  GOC discussion  P -avoid fevers -cEEG -neuro following, on keppra, prop.  -ECHO completed, f/u read but looked like possible wall motion abnormality so will recheck EKG and trops -change neo to NE, ok to run through IO -1L LR bolus then mIVF  -zosyn -Bds -follow renal indices, UOP -d/w mom at bedside, concern re likely poor prognosis based on CT, exam, myoclonus. She is going to  start thinking about QOL goals but has articulated that she knows she will need to start thinking about letting go. I will circle back later to discuss code status    Best Practice (right click and Reselect all SmartList Selections daily)   Diet/type: NPO w/ oral meds DVT prophylaxis LMWH Pressure ulcer(s): N/A GI prophylaxis: H2B Lines: N/A IO  Foley:  Yes, and it is still needed Code Status:  full code Last date of multidisciplinary goals of care discussion [ 6/12 ]  Labs   CBC: Recent Labs  Lab 01/26/24 0151 01/26/24 0422 01/26/24 0501  WBC 23.4* 11.7*  --   NEUTROABS 8.0*   --   --   HGB 14.0 14.4 14.6  HCT 45.9 40.2 43.0  MCV 99.1 100.0  --   PLT 357 280  --     Basic Metabolic Panel: Recent Labs  Lab 01/26/24 0151 01/26/24 0422 01/26/24 0501  NA 141  --  143  K 4.6  --  4.4  CL 107  --   --   CO2 18*  --   --   GLUCOSE 256*  --   --   BUN 14  --   --   CREATININE 1.22*  --   --   CALCIUM  8.2*  --   --   MG  --  2.3  --   PHOS  --  9.3*  --    GFR: Estimated Creatinine Clearance: 64 mL/min (A) (by C-G formula based on SCr of 1.22 mg/dL (H)). Recent Labs  Lab 01/26/24 0151 01/26/24 0422  WBC 23.4* 11.7*    Liver Function Tests: Recent Labs  Lab 01/26/24 0151  AST 81*  ALT 46*  ALKPHOS 71  BILITOT 0.2  PROT 6.5  ALBUMIN 3.2*   Recent Labs  Lab 01/26/24 0151  LIPASE 36   No results for input(s): AMMONIA in the last 168 hours.  ABG    Component Value Date/Time   PHART 7.227 (L) 01/26/2024 0501   PCO2ART 65.2 (HH) 01/26/2024 0501   PO2ART 422 (H) 01/26/2024 0501   HCO3 27.5 01/26/2024 0501   TCO2 30 01/26/2024 0501   ACIDBASEDEF 2.0 01/26/2024 0501   O2SAT 100 01/26/2024 0501     Coagulation Profile: No results for input(s): INR, PROTIME in the last 168 hours.  Cardiac Enzymes: No results for input(s): CKTOTAL, CKMB, CKMBINDEX, TROPONINI in the last 168 hours.  HbA1C: Hgb A1c MFr Bld  Date/Time Value Ref Range Status  01/26/2024 04:22 AM 5.7 (H) 4.8 - 5.6 % Final    Comment:    (NOTE) Diagnosis of Diabetes The following HbA1c ranges recommended by the American Diabetes Association (ADA) may be used as an aid in the diagnosis of diabetes mellitus.  Hemoglobin             Suggested A1C NGSP%              Diagnosis  <5.7                   Non Diabetic  5.7-6.4                Pre-Diabetic  >6.4                   Diabetic  <7.0                   Glycemic control for  adults with diabetes.    01/02/2022 07:13 AM 6.0 (H) 4.8 - 5.6 % Final    Comment:    (NOTE) Pre  diabetes:          5.7%-6.4%  Diabetes:              >6.4%  Glycemic control for   <7.0% adults with diabetes     CBG: Recent Labs  Lab 01/26/24 0338 01/26/24 0618 01/26/24 0737  GLUCAP 131* 113* 118*    CRITICAL CARE Performed by: Delories Fetter   Total critical care time: 46 minutes  Critical care time was exclusive of separately billable procedures and treating other patients. Critical care was necessary to treat or prevent imminent or life-threatening deterioration.  Critical care was time spent personally by me on the following activities: development of treatment plan with patient and/or surrogate as well as nursing, discussions with consultants, evaluation of patient's response to treatment, examination of patient, obtaining history from patient or surrogate, ordering and performing treatments and interventions, ordering and review of laboratory studies, ordering and review of radiographic studies, pulse oximetry and re-evaluation of patient's condition.  Eston Hence MSN, AGACNP-BC [place] [facility] Amion for pager 01/26/2024, 11:25 AM

## 2024-01-26 NOTE — TOC CM/SW Note (Signed)
 Transition of Care [facility]) - Inpatient Brief Assessment   Patient Details  Name: Holly Fuentes MRN: [id number] Date of Birth: [birth year]  Transition of Care [facility]) CM/SW Contact:    Tom-Johnson, [name] Daphne, RN Phone Number: 01/26/2024, 9:30 AM   Clinical Narrative:  Patient presented to the ED after Cardiac Arrest at home. Patient's husband called EMS and was advised to start  CPR, patient was found Pulseless by EMS. Patient has hx of COPD, Anxiety, Depression, PTSD, Opioid Abuse, HTN. Patient was intubated and sedated. On IV abx.   Patient not Medically ready for discharge.  CM will continue to follow as patient progresses with care towards discharge.       Transition of Care Asessment:

## 2024-01-27 ENCOUNTER — Inpatient Hospital Stay (HOSPITAL_COMMUNITY)

## 2024-01-27 DIAGNOSIS — J189 Pneumonia, unspecified organism: Secondary | ICD-10-CM | POA: Diagnosis not present

## 2024-01-27 DIAGNOSIS — J9602 Acute respiratory failure with hypercapnia: Secondary | ICD-10-CM | POA: Diagnosis not present

## 2024-01-27 DIAGNOSIS — I469 Cardiac arrest, cause unspecified: Secondary | ICD-10-CM | POA: Diagnosis not present

## 2024-01-27 DIAGNOSIS — Z452 Encounter for adjustment and management of vascular access device: Secondary | ICD-10-CM | POA: Diagnosis not present

## 2024-01-27 DIAGNOSIS — Z4682 Encounter for fitting and adjustment of non-vascular catheter: Secondary | ICD-10-CM | POA: Diagnosis not present

## 2024-01-27 DIAGNOSIS — E870 Hyperosmolality and hypernatremia: Secondary | ICD-10-CM

## 2024-01-27 DIAGNOSIS — J9601 Acute respiratory failure with hypoxia: Secondary | ICD-10-CM | POA: Diagnosis not present

## 2024-01-27 DIAGNOSIS — G931 Anoxic brain damage, not elsewhere classified: Secondary | ICD-10-CM | POA: Diagnosis not present

## 2024-01-27 LAB — URINALYSIS, W/ REFLEX TO CULTURE (INFECTION SUSPECTED)
Bilirubin Urine: NEGATIVE
Glucose, UA: NEGATIVE mg/dL
Hgb urine dipstick: NEGATIVE
Ketones, ur: NEGATIVE mg/dL
Leukocytes,Ua: NEGATIVE
Nitrite: NEGATIVE
Protein, ur: NEGATIVE mg/dL
Specific Gravity, Urine: 1.028 (ref 1.005–1.030)
pH: 5 (ref 5.0–8.0)

## 2024-01-27 LAB — POCT I-STAT 7, (LYTES, BLD GAS, ICA,H+H)
Acid-base deficit: 2 mmol/L (ref 0.0–2.0)
Acid-base deficit: 1 mmol/L (ref 0.0–2.0)
Bicarbonate: 25.1 mmol/L (ref 20.0–28.0)
Bicarbonate: 27.4 mmol/L (ref 20.0–28.0)
Calcium, Ion: 1.17 mmol/L (ref 1.15–1.40)
Calcium, Ion: 1.2 mmol/L (ref 1.15–1.40)
HCT: 35 % — ABNORMAL LOW (ref 36.0–46.0)
HCT: 38 % (ref 36.0–46.0)
Hemoglobin: 11.9 g/dL — ABNORMAL LOW (ref 12.0–15.0)
Hemoglobin: 12.9 g/dL (ref 12.0–15.0)
O2 Saturation: 92 %
O2 Saturation: 96 %
Patient temperature: 37.3
Potassium: 4.3 mmol/L (ref 3.5–5.1)
Potassium: 4.4 mmol/L (ref 3.5–5.1)
Sodium: 140 mmol/L (ref 135–145)
Sodium: 144 mmol/L (ref 135–145)
TCO2: 27 mmol/L (ref 22–32)
TCO2: 29 mmol/L (ref 22–32)
pCO2 arterial: 52.4 mmHg — ABNORMAL HIGH (ref 32–48)
pCO2 arterial: 59.1 mmHg — ABNORMAL HIGH (ref 32–48)
pH, Arterial: 7.29 — ABNORMAL LOW (ref 7.35–7.45)
pH, Arterial: 7.275 — ABNORMAL LOW (ref 7.35–7.45)
pO2, Arterial: 74 mmHg — ABNORMAL LOW (ref 83–108)
pO2, Arterial: 97 mmHg (ref 83–108)

## 2024-01-27 LAB — GLUCOSE, CAPILLARY
Glucose-Capillary: 120 mg/dL — ABNORMAL HIGH (ref 70–99)
Glucose-Capillary: 122 mg/dL — ABNORMAL HIGH (ref 70–99)
Glucose-Capillary: 127 mg/dL — ABNORMAL HIGH (ref 70–99)
Glucose-Capillary: 133 mg/dL — ABNORMAL HIGH (ref 70–99)
Glucose-Capillary: 144 mg/dL — ABNORMAL HIGH (ref 70–99)
Glucose-Capillary: 90 mg/dL (ref 70–99)

## 2024-01-27 LAB — BLOOD GAS, ARTERIAL
Acid-Base Excess: 3.6 mmol/L — ABNORMAL HIGH (ref 0.0–2.0)
Acid-Base Excess: 8.1 mmol/L — ABNORMAL HIGH (ref 0.0–2.0)
Bicarbonate: 31.1 mmol/L — ABNORMAL HIGH (ref 20.0–28.0)
Bicarbonate: 35.1 mmol/L — ABNORMAL HIGH (ref 20.0–28.0)
Drawn by: 50336
Drawn by: 50071
FIO2: 60 %
O2 Saturation: 98.8 %
O2 Saturation: 99.2 %
Patient temperature: 37
Patient temperature: 37
pCO2 arterial: 59 mmHg — ABNORMAL HIGH (ref 32–48)
pCO2 arterial: 58 mmHg — ABNORMAL HIGH (ref 32–48)
pH, Arterial: 7.33 — ABNORMAL LOW (ref 7.35–7.45)
pH, Arterial: 7.39 (ref 7.35–7.45)
pO2, Arterial: 102 mmHg (ref 83–108)
pO2, Arterial: 106 mmHg (ref 83–108)

## 2024-01-27 LAB — COMPREHENSIVE METABOLIC PANEL WITH GFR
ALT: 33 U/L (ref 0–44)
ALT: 32 U/L (ref 0–44)
ALT: 29 U/L (ref 0–44)
ALT: 25 U/L (ref 0–44)
AST: 42 U/L — ABNORMAL HIGH (ref 15–41)
AST: 42 U/L — ABNORMAL HIGH (ref 15–41)
AST: 39 U/L (ref 15–41)
AST: 40 U/L (ref 15–41)
Albumin: 2.5 g/dL — ABNORMAL LOW (ref 3.5–5.0)
Albumin: 2.6 g/dL — ABNORMAL LOW (ref 3.5–5.0)
Albumin: 2.5 g/dL — ABNORMAL LOW (ref 3.5–5.0)
Albumin: 2.3 g/dL — ABNORMAL LOW (ref 3.5–5.0)
Alkaline Phosphatase: 62 U/L (ref 38–126)
Alkaline Phosphatase: 82 U/L (ref 38–126)
Alkaline Phosphatase: 74 U/L (ref 38–126)
Alkaline Phosphatase: 68 U/L (ref 38–126)
Anion gap: 8 (ref 5–15)
Anion gap: 10 (ref 5–15)
Anion gap: 9 (ref 5–15)
Anion gap: 5 (ref 5–15)
BUN: 23 mg/dL — ABNORMAL HIGH (ref 6–20)
BUN: 18 mg/dL (ref 6–20)
BUN: 17 mg/dL (ref 6–20)
BUN: 17 mg/dL (ref 6–20)
CO2: 25 mmol/L (ref 22–32)
CO2: 24 mmol/L (ref 22–32)
CO2: 26 mmol/L (ref 22–32)
CO2: 29 mmol/L (ref 22–32)
Calcium: 7.9 mg/dL — ABNORMAL LOW (ref 8.9–10.3)
Calcium: 8.4 mg/dL — ABNORMAL LOW (ref 8.9–10.3)
Calcium: 8.4 mg/dL — ABNORMAL LOW (ref 8.9–10.3)
Calcium: 8.4 mg/dL — ABNORMAL LOW (ref 8.9–10.3)
Chloride: 107 mmol/L (ref 98–111)
Chloride: 108 mmol/L (ref 98–111)
Chloride: 112 mmol/L — ABNORMAL HIGH (ref 98–111)
Chloride: 113 mmol/L — ABNORMAL HIGH (ref 98–111)
Creatinine, Ser: 0.85 mg/dL (ref 0.44–1.00)
Creatinine, Ser: 0.83 mg/dL (ref 0.44–1.00)
Creatinine, Ser: 0.75 mg/dL (ref 0.44–1.00)
Creatinine, Ser: 0.81 mg/dL (ref 0.44–1.00)
GFR, Estimated: >60 mL/min (ref >60)
GFR, Estimated: >60 mL/min (ref >60)
GFR, Estimated: >60 mL/min (ref >60)
GFR, Estimated: >60 mL/min (ref >60)
Glucose, Bld: 136 mg/dL — ABNORMAL HIGH (ref 70–99)
Glucose, Bld: 145 mg/dL — ABNORMAL HIGH (ref 70–99)
Glucose, Bld: 116 mg/dL — ABNORMAL HIGH (ref 70–99)
Glucose, Bld: 98 mg/dL (ref 70–99)
Potassium: 4.4 mmol/L (ref 3.5–5.1)
Potassium: 4.4 mmol/L (ref 3.5–5.1)
Potassium: 4.6 mmol/L (ref 3.5–5.1)
Potassium: 4.3 mmol/L (ref 3.5–5.1)
Sodium: 140 mmol/L (ref 135–145)
Sodium: 142 mmol/L (ref 135–145)
Sodium: 147 mmol/L — ABNORMAL HIGH (ref 135–145)
Sodium: 147 mmol/L — ABNORMAL HIGH (ref 135–145)
Total Bilirubin: 0.7 mg/dL (ref 0.0–1.2)
Total Bilirubin: 0.6 mg/dL (ref 0.0–1.2)
Total Bilirubin: 0.4 mg/dL (ref 0.0–1.2)
Total Bilirubin: 0.6 mg/dL (ref 0.0–1.2)
Total Protein: 5.7 g/dL — ABNORMAL LOW (ref 6.5–8.1)
Total Protein: 6 g/dL — ABNORMAL LOW (ref 6.5–8.1)
Total Protein: 5.7 g/dL — ABNORMAL LOW (ref 6.5–8.1)
Total Protein: 5.6 g/dL — ABNORMAL LOW (ref 6.5–8.1)

## 2024-01-27 LAB — CBC
HCT: 38.2 % (ref 36.0–46.0)
HCT: 40.4 % (ref 36.0–46.0)
HCT: 38.5 % (ref 36.0–46.0)
HCT: 35.8 % — ABNORMAL LOW (ref 36.0–46.0)
Hemoglobin: 12.4 g/dL (ref 12.0–15.0)
Hemoglobin: 12.9 g/dL (ref 12.0–15.0)
Hemoglobin: 12.2 g/dL (ref 12.0–15.0)
Hemoglobin: 11.4 g/dL — ABNORMAL LOW (ref 12.0–15.0)
MCH: 30.2 pg (ref 26.0–34.0)
MCH: 29.9 pg (ref 26.0–34.0)
MCH: 29.8 pg (ref 26.0–34.0)
MCH: 30.1 pg (ref 26.0–34.0)
MCHC: 32.5 g/dL (ref 30.0–36.0)
MCHC: 31.9 g/dL (ref 30.0–36.0)
MCHC: 31.7 g/dL (ref 30.0–36.0)
MCHC: 31.8 g/dL (ref 30.0–36.0)
MCV: 93.2 fL (ref 80.0–100.0)
MCV: 93.7 fL (ref 80.0–100.0)
MCV: 93.9 fL (ref 80.0–100.0)
MCV: 94.5 fL (ref 80.0–100.0)
Platelets: 233 10*3/uL (ref 150–400)
Platelets: 286 10*3/uL (ref 150–400)
Platelets: 225 10*3/uL (ref 150–400)
Platelets: 209 10*3/uL (ref 150–400)
RBC: 4.1 MIL/uL (ref 3.87–5.11)
RBC: 4.31 MIL/uL (ref 3.87–5.11)
RBC: 4.1 MIL/uL (ref 3.87–5.11)
RBC: 3.79 MIL/uL — ABNORMAL LOW (ref 3.87–5.11)
RDW: 13.7 % (ref 11.5–15.5)
RDW: 13.8 % (ref 11.5–15.5)
RDW: 13.8 % (ref 11.5–15.5)
RDW: 13.9 % (ref 11.5–15.5)
WBC: 16 10*3/uL — ABNORMAL HIGH (ref 4.0–10.5)
WBC: 19 10*3/uL — ABNORMAL HIGH (ref 4.0–10.5)
WBC: 14 10*3/uL — ABNORMAL HIGH (ref 4.0–10.5)
WBC: 13.7 10*3/uL — ABNORMAL HIGH (ref 4.0–10.5)
nRBC: 0 % (ref 0.0–0.2)
nRBC: 0 % (ref 0.0–0.2)
nRBC: 0 % (ref 0.0–0.2)
nRBC: 0 % (ref 0.0–0.2)

## 2024-01-27 LAB — FIBRINOGEN
Fibrinogen: 606 mg/dL — ABNORMAL HIGH (ref 210–475)
Fibrinogen: 754 mg/dL — ABNORMAL HIGH (ref 210–475)
Fibrinogen: 722 mg/dL — ABNORMAL HIGH (ref 210–475)
Fibrinogen: 715 mg/dL — ABNORMAL HIGH (ref 210–475)

## 2024-01-27 LAB — MAGNESIUM
Magnesium: 2.2 mg/dL (ref 1.7–2.4)
Magnesium: 2.5 mg/dL — ABNORMAL HIGH (ref 1.7–2.4)
Magnesium: 2.6 mg/dL — ABNORMAL HIGH (ref 1.7–2.4)
Magnesium: 2.6 mg/dL — ABNORMAL HIGH (ref 1.7–2.4)

## 2024-01-27 LAB — PROTIME-INR
INR: 1.2 (ref 0.8–1.2)
INR: 1.2 (ref 0.8–1.2)
INR: 1.3 — ABNORMAL HIGH (ref 0.8–1.2)
INR: 1.3 — ABNORMAL HIGH (ref 0.8–1.2)
Prothrombin Time: 15.7 s — ABNORMAL HIGH (ref 11.4–15.2)
Prothrombin Time: 15.5 s — ABNORMAL HIGH (ref 11.4–15.2)
Prothrombin Time: 16.2 s — ABNORMAL HIGH (ref 11.4–15.2)
Prothrombin Time: 16.1 s — ABNORMAL HIGH (ref 11.4–15.2)

## 2024-01-27 LAB — APTT
aPTT: 32 s (ref 24–36)
aPTT: 31 s (ref 24–36)
aPTT: 35 s (ref 24–36)
aPTT: 35 s (ref 24–36)

## 2024-01-27 LAB — BILIRUBIN, DIRECT
Bilirubin, Direct: 0.1 mg/dL (ref 0.0–0.2)
Bilirubin, Direct: 0.1 mg/dL (ref 0.0–0.2)
Bilirubin, Direct: 0.1 mg/dL (ref 0.0–0.2)
Bilirubin, Direct: 0.1 mg/dL (ref 0.0–0.2)

## 2024-01-27 LAB — PHOSPHORUS
Phosphorus: 4 mg/dL (ref 2.5–4.6)
Phosphorus: 3.9 mg/dL (ref 2.5–4.6)
Phosphorus: 3.8 mg/dL (ref 2.5–4.6)
Phosphorus: 3.6 mg/dL (ref 2.5–4.6)

## 2024-01-27 LAB — LACTIC ACID, PLASMA: Lactic Acid, Venous: 1.7 mmol/L (ref 0.5–1.9)

## 2024-01-27 MED ORDER — LACTATED RINGERS IV SOLN: INTRAVENOUS | Status: DC

## 2024-01-27 MED ORDER — VASOPRESSIN 20 UNITS/100 ML INFUSION FOR ORGAN DONOR
0.0000 [IU]/h | INTRAVENOUS | Status: DC
  Administered 2024-01-27: 1 [IU]/h via INTRAVENOUS
  Filled 2024-01-27: qty 100

## 2024-01-27 MED ORDER — FLUCONAZOLE IN SODIUM CHLORIDE 200-0.9 MG/100ML-% IV SOLN
200.0000 mg | Freq: Once | INTRAVENOUS | Status: AC
  Administered 2024-01-27: 200 mg via INTRAVENOUS
  Filled 2024-01-27: qty 100

## 2024-01-27 NOTE — Procedures (Signed)
 Central Venous Catheter Insertion Procedure Note  Holly Fuentes  [id number]  05/11/1971  Date:01/27/24  Time:12:48 AM   Provider Performing:Naava Janeway W Adriane Albe   Procedure: Insertion of Non-tunneled Central Venous [phone]) with US  guidance (56213)   Indication(s) Medication administration  Consent Risks of the procedure as well as the alternatives and risks of each were explained to the patient and/or caregiver.  Consent for the procedure was obtained and is signed in the bedside chart  Anesthesia Topical only with 1% lidocaine   Timeout Verified patient identification, verified procedure, site/side was marked, verified correct patient position, special equipment/implants available, medications/allergies/relevant history reviewed, required imaging and test results available.  Sterile Technique Maximal sterile technique including full sterile barrier drape, hand hygiene, sterile gown, sterile gloves, mask, hair covering, sterile ultrasound probe cover (if used).  Procedure Description Area of catheter insertion was cleaned with chlorhexidine  and draped in sterile fashion.  With real-time ultrasound guidance a central venous catheter was placed into the left internal jugular vein. Nonpulsatile blood flow and easy flushing noted in all ports.  The catheter was sutured in place and sterile dressing applied.  Complications/Tolerance None; patient tolerated the procedure well. Chest X-ray is ordered to verify placement for internal jugular or subclavian cannulation.   Chest x-ray is not ordered for femoral cannulation.  EBL Minimal  Specimen(s) None     [name], AGACNP-BC [place] [facility]  See Amion for personal pager PCCM on call pager [phone] until 7pm. Please call Elink 7p-7a. [phone]  01/27/2024 12:49 AM

## 2024-01-27 NOTE — Progress Notes (Signed)
 Nutrition Brief Note  Consult received for initiation of enteral nutrition.  Chart reviewed. Pt transitioned to comfort care.  No further nutrition interventions planned at this time.  Please re-consult as needed.   Allie Tereasa Yilmaz, RDN, LDN Clinical Nutrition See AMiON for contact information.

## 2024-01-27 NOTE — Plan of Care (Signed)
  Problem: Clinical Measurements: Goal: Respiratory complications will improve Outcome: Not Progressing Goal: Cardiovascular complication will be avoided Outcome: Not Progressing   Problem: Clinical Measurements: Goal: Cardiovascular complication will be avoided Outcome: Not Progressing   Problem: Activity: Goal: Risk for activity intolerance will decrease Outcome: Not Progressing   Problem: Pain Managment: Goal: General experience of comfort will improve and/or be controlled Outcome: Progressing

## 2024-01-27 NOTE — Progress Notes (Signed)
 NAME:  Holly Fuentes, MRN:  [id number], DOB:  [birth year], LOS: 1 ADMISSION DATE:  01/26/2024, CONSULTATION DATE:  09/28/2023 REFERRING MD:  Roberts Ching, MD, CHIEF COMPLAINT:  cardiac arrest   History of Present Illness:  A 53 yr old female patient with anxiety, depression, PTSD, COPD, dyslipidemia, HTN, tobacco smoking, opioid abuse, and obesity, who presented to ED after cardiac arrest.  EMS were called to the scene with patient down and by her significant other.  She was sitting in a chair and seemed to be very drowsy.  She fell forward and vomiting profusely.  He tried to clear the vomit and help her breath.  He called 911 and was advised to start CPR, which he attempted.  EMS arrived on scene to find the patient without a pulse. They started CPR, inserted an I-Gel airway, and gave a single Epi.  They did not give Narcan as the husband had given intramuscular Narcan prior to their arrival after being given this by EMS from a prior visit.  She does use opiates and has had overdose in the past.  Hx was taken by chart review and d/w ED MD and staff. The husband has left.   Pertinent  Medical History  anxiety, depression, PTSD, COPD, dyslipidemia, HTN, opioid abuse, obesity   Significant Hospital Events: Including procedures, antibiotic start and stop dates in addition to other pertinent events   Intubated with ETT 7.5 @ 25 cm at the teeth which was retracted to 22 cm. On sedation (Propofol  and Fentanyl ). She has 3 myoclonic jerks while I was examined her and before per the RN.   6/12 transitioned to comfort care then changed to donor workup status prior to extubation  6/13 ongoing donor workup   Interim History / Subjective:  Overnight variable Bps and rpessor needs   Objective    Blood pressure 126/69, pulse 89, temperature 98.4 F (36.9 C), resp. rate (!) 28, height 5' 3 (1.6 m), weight 107.1 kg, last menstrual period 01/18/2016, SpO2 96%.    Vent Mode: PRVC FiO2 (%):  [60 %] 60  % Set Rate:  [24 bmp-28 bmp] 28 bmp Vt Set:  [420 mL] 420 mL PEEP:  [5 cmH20] 5 cmH20 Plateau Pressure:  [16 cmH20-20 cmH20] 16 cmH20   Intake/Output Summary (Last 24 hours) at 01/27/2024 1054 Last data filed at 01/27/2024 1042 Gross per 24 hour  Intake 4225.06 ml  Output 3154 ml  Net 1071.06 ml   Filed Weights   01/26/24 0219 01/26/24 0352 01/26/24 0800  Weight: 100 kg 108.3 kg 107.1 kg    Examination: General: Critically and chronically ill middle aged F  HENT: NCAT ETT secure anicteric sclera  Lungs: Mechanically ventilated, diminished bases, scattered rhonchi  Cardiovascular: rr s1s2  Abdomen: obese soft ndnt  Extremities: RLE IO  Neuro: sedated. anisocoria. Triggering vent   Resolved problem list   Assessment and Plan   GOC discussion Encounter for palliative care DNR Organ donor workup  OOH cardiac arrest Hypotension Anoxic encephalopathy Myoclonus Cerebral edema Acute respiratory failure w hypoxia and hypercarbia Aspiration event / aspiration PNA  Leukocytosis: reactive vs sepsis 2/2 above COPD Polysubstance use  Elevated LFTs  Hx PTSD, anxiety, depression Hx HTN Hx HLD P -ongoing donor workup per HB -cont MV support, incr RR 6/13 based on AM gas -continues on prop and morphine  in ICU -on zosyn  -NE as needed for MAP > 65 -watching UOP   Best Practice (right click and Reselect all SmartList Selections daily)  Diet/type: NPO w/ oral meds DVT prophylaxis LMWH Pressure ulcer(s): N/A GI prophylaxis: H2B Lines: N/A IO  Foley:  Yes, and it is still needed Code Status:  full code Last date of multidisciplinary goals of care discussion [ 6/12 ]  Labs   CBC: Recent Labs  Lab 01/26/24 0151 01/26/24 0422 01/26/24 0501 01/26/24 2045 01/26/24 2101 01/27/24 0140 01/27/24 0208 01/27/24 0840 01/27/24 0843  WBC 23.4* 11.7*  --  16.0*  --   --  16.0* 19.0*  --   NEUTROABS 8.0*  --   --   --   --   --   --   --   --   HGB 14.0 14.4   < > 13.7  13.3 11.9* 12.4 12.9 12.9  HCT 45.9 40.2   < > 42.6 39.0 35.0* 38.2 40.4 38.0  MCV 99.1 100.0  --  92.8  --   --  93.2 93.7  --   PLT 357 280  --  262  --   --  233 286  --    < > = values in this interval not displayed.    Basic Metabolic Panel: Recent Labs  Lab 01/26/24 0151 01/26/24 0422 01/26/24 0501 01/26/24 1809 01/26/24 2045 01/26/24 2101 01/27/24 0140 01/27/24 0208 01/27/24 0840 01/27/24 0843  NA 141  --    < > 143 141 142 140 140 142 144  K 4.6  --    < > 4.7 4.5 4.4 4.3 4.4 4.4 4.4  CL 107  --   --  108 106  --   --  107 108  --   CO2 18*  --   --  23 23  --   --  25 24  --   GLUCOSE 256*  --   --  129* 113*  --   --  136* 145*  --   BUN 14  --   --  20 23*  --   --  23* 18  --   CREATININE 1.22*  --   --  1.18* 1.03*  --   --  0.85 0.83  --   CALCIUM  8.2*  --   --  8.2* 8.0*  --   --  7.9* 8.4*  --   MG  --  2.1  --   --  1.9  --   --  2.2 2.5*  --   PHOS  --  5.0*  --  4.8* 4.9*  --   --  4.0 3.9  --    < > = values in this interval not displayed.   GFR: Estimated Creatinine Clearance: 94.1 mL/min (by C-G formula based on SCr of 0.83 mg/dL). Recent Labs  Lab 01/26/24 0422 01/26/24 2045 01/26/24 2046 01/27/24 0208 01/27/24 0840  WBC 11.7* 16.0*  --  16.0* 19.0*  LATICACIDVEN  --   --  2.1* 1.7  --     Liver Function Tests: Recent Labs  Lab 01/26/24 0151 01/26/24 2045 01/27/24 0208 01/27/24 0840  AST 81* 47* 42* 42*  ALT 46* 40 33 32  ALKPHOS 71 68 62 82  BILITOT 0.2 0.8 0.7 0.6  PROT 6.5 6.4* 5.7* 6.0*  ALBUMIN 3.2* 2.8* 2.5* 2.6*   Recent Labs  Lab 01/26/24 0151 01/26/24 2045  LIPASE 36 21  AMYLASE  --  21*   No results for input(s): AMMONIA in the last 168 hours.  ABG    Component Value Date/Time   PHART 7.275 (L) 01/27/2024 4010  PCO2ART 59.1 (H) 01/27/2024 0843   PO2ART 97 01/27/2024 0843   HCO3 27.4 01/27/2024 0843   TCO2 29 01/27/2024 0843   ACIDBASEDEF 1.0 01/27/2024 0843   O2SAT 96 01/27/2024 0843     Coagulation  Profile: Recent Labs  Lab 01/26/24 2045 01/27/24 0208 01/27/24 0840  INR 1.2 1.2 1.2    Cardiac Enzymes: No results for input(s): CKTOTAL, CKMB, CKMBINDEX, TROPONINI in the last 168 hours.  HbA1C: Hgb A1c MFr Bld  Date/Time Value Ref Range Status  01/26/2024 04:22 AM 5.7 (H) 4.8 - 5.6 % Final    Comment:    (NOTE) Diagnosis of Diabetes The following HbA1c ranges recommended by the American Diabetes Association (ADA) may be used as an aid in the diagnosis of diabetes mellitus.  Hemoglobin             Suggested A1C NGSP%              Diagnosis  <5.7                   Non Diabetic  5.7-6.4                Pre-Diabetic  >6.4                   Diabetic  <7.0                   Glycemic control for                       adults with diabetes.    01/02/2022 07:13 AM 6.0 (H) 4.8 - 5.6 % Final    Comment:    (NOTE) Pre diabetes:          5.7%-6.4%  Diabetes:              >6.4%  Glycemic control for   <7.0% adults with diabetes     CBG: Recent Labs  Lab 01/26/24 1813 01/26/24 1936 01/26/24 2349 01/27/24 0329 01/27/24 0844  GLUCAP 128* 109* 138* 122* 144*    CRITICAL CARE Performed by: Delories Fetter   Total critical care time: 35 minutes  Critical care time was exclusive of separately billable procedures and treating other patients. Critical care was necessary to treat or prevent imminent or life-threatening deterioration.  Critical care was time spent personally by me on the following activities: development of treatment plan with patient and/or surrogate as well as nursing, discussions with consultants, evaluation of patient's response to treatment, examination of patient, obtaining history from patient or surrogate, ordering and performing treatments and interventions, ordering and review of laboratory studies, ordering and review of radiographic studies, pulse oximetry and re-evaluation of patient's condition.  Eston Hence MSN, AGACNP-BC [place]  Pulmonary/Critical Care Medicine Amion for pager  01/27/2024, 10:54 AM

## 2024-01-28 ENCOUNTER — Encounter (HOSPITAL_COMMUNITY): Admission: EM | Disposition: E | Payer: Self-pay | Source: Home / Self Care | Attending: Critical Care Medicine

## 2024-01-28 ENCOUNTER — Encounter (HOSPITAL_COMMUNITY): Payer: Self-pay | Admitting: Anesthesiology

## 2024-01-28 DIAGNOSIS — G253 Myoclonus: Secondary | ICD-10-CM

## 2024-01-28 DIAGNOSIS — J9602 Acute respiratory failure with hypercapnia: Secondary | ICD-10-CM | POA: Diagnosis not present

## 2024-01-28 DIAGNOSIS — J9601 Acute respiratory failure with hypoxia: Secondary | ICD-10-CM

## 2024-01-28 DIAGNOSIS — J690 Pneumonitis due to inhalation of food and vomit: Secondary | ICD-10-CM | POA: Diagnosis not present

## 2024-01-28 DIAGNOSIS — G9340 Encephalopathy, unspecified: Secondary | ICD-10-CM | POA: Diagnosis not present

## 2024-01-28 DIAGNOSIS — I469 Cardiac arrest, cause unspecified: Secondary | ICD-10-CM | POA: Diagnosis not present

## 2024-01-28 DIAGNOSIS — G936 Cerebral edema: Secondary | ICD-10-CM | POA: Diagnosis not present

## 2024-01-28 DIAGNOSIS — N269 Renal sclerosis, unspecified: Secondary | ICD-10-CM | POA: Diagnosis not present

## 2024-01-28 HISTORY — PX: ORGAN PROCUREMENT: SHX5270

## 2024-01-28 LAB — URINE CULTURE: Culture: NO GROWTH

## 2024-01-28 LAB — COMPREHENSIVE METABOLIC PANEL WITH GFR
ALT: 24 U/L (ref 0–44)
ALT: 22 U/L (ref 0–44)
ALT: 20 U/L (ref 0–44)
AST: 42 U/L — ABNORMAL HIGH (ref 15–41)
AST: 42 U/L — ABNORMAL HIGH (ref 15–41)
AST: 34 U/L (ref 15–41)
Albumin: 2.2 g/dL — ABNORMAL LOW (ref 3.5–5.0)
Albumin: 2.2 g/dL — ABNORMAL LOW (ref 3.5–5.0)
Albumin: 2.3 g/dL — ABNORMAL LOW (ref 3.5–5.0)
Alkaline Phosphatase: 65 U/L (ref 38–126)
Alkaline Phosphatase: 55 U/L (ref 38–126)
Alkaline Phosphatase: 65 U/L (ref 38–126)
Anion gap: 7 (ref 5–15)
Anion gap: 7 (ref 5–15)
Anion gap: 6 (ref 5–15)
BUN: 19 mg/dL (ref 6–20)
BUN: 22 mg/dL — ABNORMAL HIGH (ref 6–20)
BUN: 24 mg/dL — ABNORMAL HIGH (ref 6–20)
CO2: 28 mmol/L (ref 22–32)
CO2: 28 mmol/L (ref 22–32)
CO2: 29 mmol/L (ref 22–32)
Calcium: 8.4 mg/dL — ABNORMAL LOW (ref 8.9–10.3)
Calcium: 8.5 mg/dL — ABNORMAL LOW (ref 8.9–10.3)
Calcium: 8.3 mg/dL — ABNORMAL LOW (ref 8.9–10.3)
Chloride: 113 mmol/L — ABNORMAL HIGH (ref 98–111)
Chloride: 112 mmol/L — ABNORMAL HIGH (ref 98–111)
Chloride: 112 mmol/L — ABNORMAL HIGH (ref 98–111)
Creatinine, Ser: 0.84 mg/dL (ref 0.44–1.00)
Creatinine, Ser: 0.79 mg/dL (ref 0.44–1.00)
Creatinine, Ser: 0.83 mg/dL (ref 0.44–1.00)
GFR, Estimated: >60 mL/min (ref >60)
GFR, Estimated: >60 mL/min (ref >60)
GFR, Estimated: >60 mL/min (ref >60)
Glucose, Bld: 133 mg/dL — ABNORMAL HIGH (ref 70–99)
Glucose, Bld: 139 mg/dL — ABNORMAL HIGH (ref 70–99)
Glucose, Bld: 121 mg/dL — ABNORMAL HIGH (ref 70–99)
Potassium: 4.2 mmol/L (ref 3.5–5.1)
Potassium: 4.3 mmol/L (ref 3.5–5.1)
Potassium: 4.2 mmol/L (ref 3.5–5.1)
Sodium: 148 mmol/L — ABNORMAL HIGH (ref 135–145)
Sodium: 147 mmol/L — ABNORMAL HIGH (ref 135–145)
Sodium: 147 mmol/L — ABNORMAL HIGH (ref 135–145)
Total Bilirubin: 0.8 mg/dL (ref 0.0–1.2)
Total Bilirubin: 0.7 mg/dL (ref 0.0–1.2)
Total Bilirubin: 0.7 mg/dL (ref 0.0–1.2)
Total Protein: 5.4 g/dL — ABNORMAL LOW (ref 6.5–8.1)
Total Protein: 5.5 g/dL — ABNORMAL LOW (ref 6.5–8.1)
Total Protein: 5.9 g/dL — ABNORMAL LOW (ref 6.5–8.1)

## 2024-01-28 LAB — BILIRUBIN, DIRECT
Bilirubin, Direct: 0.2 mg/dL (ref 0.0–0.2)
Bilirubin, Direct: 0.2 mg/dL (ref 0.0–0.2)
Bilirubin, Direct: 0.2 mg/dL (ref 0.0–0.2)

## 2024-01-28 LAB — PHOSPHORUS
Phosphorus: 3.3 mg/dL (ref 2.5–4.6)
Phosphorus: 3.1 mg/dL (ref 2.5–4.6)
Phosphorus: 3 mg/dL (ref 2.5–4.6)

## 2024-01-28 LAB — CBC
HCT: 34.1 % — ABNORMAL LOW (ref 36.0–46.0)
HCT: 33.7 % — ABNORMAL LOW (ref 36.0–46.0)
HCT: 33.2 % — ABNORMAL LOW (ref 36.0–46.0)
Hemoglobin: 10.7 g/dL — ABNORMAL LOW (ref 12.0–15.0)
Hemoglobin: 10.4 g/dL — ABNORMAL LOW (ref 12.0–15.0)
Hemoglobin: 10.3 g/dL — ABNORMAL LOW (ref 12.0–15.0)
MCH: 30 pg (ref 26.0–34.0)
MCH: 29.5 pg (ref 26.0–34.0)
MCH: 29.4 pg (ref 26.0–34.0)
MCHC: 31.4 g/dL (ref 30.0–36.0)
MCHC: 30.9 g/dL (ref 30.0–36.0)
MCHC: 31 g/dL (ref 30.0–36.0)
MCV: 95.5 fL (ref 80.0–100.0)
MCV: 95.7 fL (ref 80.0–100.0)
MCV: 94.9 fL (ref 80.0–100.0)
Platelets: 188 10*3/uL (ref 150–400)
Platelets: 198 10*3/uL (ref 150–400)
Platelets: 198 10*3/uL (ref 150–400)
RBC: 3.57 MIL/uL — ABNORMAL LOW (ref 3.87–5.11)
RBC: 3.52 MIL/uL — ABNORMAL LOW (ref 3.87–5.11)
RBC: 3.5 MIL/uL — ABNORMAL LOW (ref 3.87–5.11)
RDW: 14 % (ref 11.5–15.5)
RDW: 14.3 % (ref 11.5–15.5)
RDW: 14.3 % (ref 11.5–15.5)
WBC: 13.1 10*3/uL — ABNORMAL HIGH (ref 4.0–10.5)
WBC: 12.3 10*3/uL — ABNORMAL HIGH (ref 4.0–10.5)
WBC: 12.8 10*3/uL — ABNORMAL HIGH (ref 4.0–10.5)
nRBC: 0 % (ref 0.0–0.2)
nRBC: 0 % (ref 0.0–0.2)
nRBC: 0 % (ref 0.0–0.2)

## 2024-01-28 LAB — URINALYSIS, ROUTINE W REFLEX MICROSCOPIC
Bilirubin Urine: NEGATIVE
Glucose, UA: NEGATIVE mg/dL
Ketones, ur: NEGATIVE mg/dL
Leukocytes,Ua: NEGATIVE
Nitrite: NEGATIVE
Protein, ur: NEGATIVE mg/dL
Specific Gravity, Urine: 1.024 (ref 1.005–1.030)
pH: 5 (ref 5.0–8.0)

## 2024-01-28 LAB — ABO/RH: ABO/RH(D): O POS

## 2024-01-28 LAB — POCT I-STAT 7, (LYTES, BLD GAS, ICA,H+H)
Acid-Base Excess: 3 mmol/L — ABNORMAL HIGH (ref 0.0–2.0)
Bicarbonate: 28.4 mmol/L — ABNORMAL HIGH (ref 20.0–28.0)
Calcium, Ion: 1.2 mmol/L (ref 1.15–1.40)
HCT: 28 % — ABNORMAL LOW (ref 36.0–46.0)
Hemoglobin: 9.5 g/dL — ABNORMAL LOW (ref 12.0–15.0)
O2 Saturation: 97 %
Patient temperature: 37
Potassium: 4.2 mmol/L (ref 3.5–5.1)
Sodium: 148 mmol/L — ABNORMAL HIGH (ref 135–145)
TCO2: 30 mmol/L (ref 22–32)
pCO2 arterial: 45.1 mmHg (ref 32–48)
pH, Arterial: 7.407 (ref 7.35–7.45)
pO2, Arterial: 95 mmHg (ref 83–108)

## 2024-01-28 LAB — PROTIME-INR
INR: 1.3 — ABNORMAL HIGH (ref 0.8–1.2)
INR: 1.3 — ABNORMAL HIGH (ref 0.8–1.2)
INR: 1.2 (ref 0.8–1.2)
Prothrombin Time: 16.2 s — ABNORMAL HIGH (ref 11.4–15.2)
Prothrombin Time: 16 s — ABNORMAL HIGH (ref 11.4–15.2)
Prothrombin Time: 15 s (ref 11.4–15.2)

## 2024-01-28 LAB — BLOOD GAS, ARTERIAL
Acid-Base Excess: 3.2 mmol/L — ABNORMAL HIGH (ref 0.0–2.0)
Acid-Base Excess: 3.9 mmol/L — ABNORMAL HIGH (ref 0.0–2.0)
Bicarbonate: 29.9 mmol/L — ABNORMAL HIGH (ref 20.0–28.0)
Bicarbonate: 30.2 mmol/L — ABNORMAL HIGH (ref 20.0–28.0)
O2 Saturation: 100 %
O2 Saturation: 99.1 %
Patient temperature: 37
Patient temperature: 37
pCO2 arterial: 51 mmHg — ABNORMAL HIGH (ref 32–48)
pCO2 arterial: 53 mmHg — ABNORMAL HIGH (ref 32–48)
pH, Arterial: 7.36 (ref 7.35–7.45)
pH, Arterial: 7.38 (ref 7.35–7.45)
pO2, Arterial: 109 mmHg — ABNORMAL HIGH (ref 83–108)
pO2, Arterial: 126 mmHg — ABNORMAL HIGH (ref 83–108)

## 2024-01-28 LAB — FIBRINOGEN
Fibrinogen: 705 mg/dL — ABNORMAL HIGH (ref 210–475)
Fibrinogen: 735 mg/dL — ABNORMAL HIGH (ref 210–475)
Fibrinogen: >800 mg/dL — ABNORMAL HIGH (ref 210–475)

## 2024-01-28 LAB — APTT
aPTT: 34 s (ref 24–36)
aPTT: 32 s (ref 24–36)
aPTT: 37 s — ABNORMAL HIGH (ref 24–36)

## 2024-01-28 LAB — PREPARE RBC (CROSSMATCH)

## 2024-01-28 LAB — GLUCOSE, CAPILLARY
Glucose-Capillary: 137 mg/dL — ABNORMAL HIGH (ref 70–99)
Glucose-Capillary: 151 mg/dL — ABNORMAL HIGH (ref 70–99)
Glucose-Capillary: 115 mg/dL — ABNORMAL HIGH (ref 70–99)
Glucose-Capillary: 112 mg/dL — ABNORMAL HIGH (ref 70–99)

## 2024-01-28 LAB — MAGNESIUM
Magnesium: 2.6 mg/dL — ABNORMAL HIGH (ref 1.7–2.4)
Magnesium: 2.5 mg/dL — ABNORMAL HIGH (ref 1.7–2.4)
Magnesium: 2.7 mg/dL — ABNORMAL HIGH (ref 1.7–2.4)

## 2024-01-28 SURGERY — SURGICAL PROCUREMENT, ORGAN
Anesthesia: General

## 2024-01-28 MED ORDER — MIDAZOLAM HCL 2 MG/2ML IJ SOLN
1.0000 mg | INTRAMUSCULAR | Status: DC | PRN
  Administered 2024-01-28: 1 mg via INTRAVENOUS
  Filled 2024-01-28: qty 2

## 2024-01-28 MED ORDER — HEPARIN SODIUM 10000 UNIT/ML FOR ORGAN DONATION
100.0000 [IU]/kg | INTRAMUSCULAR | Status: AC
Start: 2024-01-28 — End: 2024-01-28
  Administered 2024-01-28: 10700 [IU] via INTRAVENOUS
  Filled 2024-01-28: qty 1.07
  Filled 2024-01-28: qty 2

## 2024-01-28 MED ORDER — MIDAZOLAM BOLUS VIA INFUSION (WITHDRAWAL LIFE SUSTAINING TX): 2.0000 mg | INTRAVENOUS | Status: DC | PRN

## 2024-01-28 MED ORDER — PIPERACILLIN-TAZOBACTAM 3.375 G IVPB
3.3750 g | Freq: Three times a day (TID) | INTRAVENOUS | Status: DC
  Administered 2024-01-28: 3.375 g via INTRAVENOUS
  Filled 2024-01-28: qty 50

## 2024-01-28 MED ORDER — SODIUM CHLORIDE 0.9 % IR SOLN
Status: DC | PRN
  Administered 2024-01-28: 6000 mL

## 2024-01-28 MED ORDER — HEPARIN SODIUM (PORCINE) 1000 UNIT/ML IJ SOLN
10700.0000 [IU] | Freq: Once | INTRAMUSCULAR | Status: DC
  Filled 2024-01-28: qty 10.7

## 2024-01-28 MED ORDER — MIDAZOLAM-SODIUM CHLORIDE 100-0.9 MG/100ML-% IV SOLN: 0.0000 mg/h | INTRAVENOUS | Status: DC

## 2024-01-28 SURGICAL SUPPLY — 76 items
BAG COUNTER SPONGE SURGICOUNT (BAG) ×1 IMPLANT
BLADE CLIPPER SURG (BLADE) IMPLANT
BLADE SAW STERNAL (BLADE) ×1 IMPLANT
BLADE SURG 10 STRL SS (BLADE) IMPLANT
CLIP APPLIE 11 MED OPEN (CLIP) ×1 IMPLANT
CLIP TI MEDIUM 24 (CLIP) IMPLANT
CLIP TI WIDE RED SMALL 24 (CLIP) IMPLANT
CNTNR URN SCR LID CUP LEK RST (MISCELLANEOUS) ×1 IMPLANT
COVER BACK TABLE 60X90IN (DRAPES) IMPLANT
COVER MAYO STAND STRL (DRAPES) IMPLANT
COVER SURGICAL LIGHT HANDLE (MISCELLANEOUS) ×1 IMPLANT
DRAPE HALF SHEET 40X57 (DRAPES) IMPLANT
DRAPE SLUSH MACHINE 52X66 (DRAPES) ×1 IMPLANT
DRAPE SLUSH/WARMER DISC (DRAPES) IMPLANT
DRSG COVADERM 4X10 (GAUZE/BANDAGES/DRESSINGS) IMPLANT
DRSG TELFA 3X8 NADH STRL (GAUZE/BANDAGES/DRESSINGS) ×1 IMPLANT
DURAPREP 26ML APPLICATOR (WOUND CARE) IMPLANT
ELECT BLADE 6.5 EXT (BLADE) IMPLANT
ELECTRODE REM PT RTRN 9FT ADLT (ELECTROSURGICAL) ×2 IMPLANT
GAUZE 4X4 16PLY ~~LOC~~+RFID DBL (SPONGE) IMPLANT
GLOVE BIO SURGEON STRL SZ7 (GLOVE) IMPLANT
GLOVE BIO SURGEON STRL SZ7.5 (GLOVE) IMPLANT
GLOVE BIO SURGEON STRL SZ8 (GLOVE) IMPLANT
GLOVE BIO SURGEON STRL SZ8.5 (GLOVE) IMPLANT
GLOVE BIOGEL PI IND STRL 7.0 (GLOVE) IMPLANT
GLOVE BIOGEL PI IND STRL 7.5 (GLOVE) IMPLANT
GLOVE BIOGEL PI IND STRL 8 (GLOVE) IMPLANT
GLOVE BIOGEL PI IND STRL 8.5 (GLOVE) IMPLANT
GLOVE SURG SS PI 7.0 STRL IVOR (GLOVE) IMPLANT
GLOVE SURG SS PI 7.5 STRL IVOR (GLOVE) IMPLANT
GLOVE SURG SS PI 8.0 STRL IVOR (GLOVE) IMPLANT
GOWN STRL REUS W/ TWL LRG LVL3 (GOWN DISPOSABLE) ×4 IMPLANT
GOWN STRL REUS W/ TWL XL LVL3 (GOWN DISPOSABLE) ×2 IMPLANT
HANDLE SUCTION POOLE (INSTRUMENTS) IMPLANT
KIT POST MORTEM ADULT 36X90 (BAG) ×1 IMPLANT
KIT TURNOVER KIT B (KITS) ×1 IMPLANT
LOOP VASCLR MAXI BLUE 18IN ST (MISCELLANEOUS) IMPLANT
LOOPS VASCLR MAXI BLUE 18IN ST (MISCELLANEOUS) IMPLANT
MANIFOLD NEPTUNE II (INSTRUMENTS) ×1 IMPLANT
NDL BIOPSY 14X6 SOFT TISS (NEEDLE) IMPLANT
NEEDLE BIOPSY 14X6 SOFT TISS (NEEDLE) IMPLANT
NS IRRIG 1000ML POUR BTL (IV SOLUTION) IMPLANT
PACK AORTA (CUSTOM PROCEDURE TRAY) ×1 IMPLANT
PAD ARMBOARD POSITIONER FOAM (MISCELLANEOUS) ×2 IMPLANT
PENCIL BUTTON HOLSTER BLD 10FT (ELECTRODE) ×1 IMPLANT
SOL PREP POV-IOD 4OZ 10% (MISCELLANEOUS) ×2 IMPLANT
SPONGE INTESTINAL PEANUT (DISPOSABLE) IMPLANT
SPONGE T-LAP 18X18 ~~LOC~~+RFID (SPONGE) IMPLANT
STAPLER SKIN PROX 35W (STAPLE) ×1 IMPLANT
SUT BONE WAX W31G (SUTURE) IMPLANT
SUT ETHIBOND 5 LR DA (SUTURE) IMPLANT
SUT ETHILON 1 LR 30 (SUTURE) ×2 IMPLANT
SUT ETHILON 2 LR (SUTURE) IMPLANT
SUT PROLENE 3 0 SH 1 (SUTURE) IMPLANT
SUT PROLENE 4-0 RB1 .5 CRCL 36 (SUTURE) IMPLANT
SUT PROLENE 5 0 C 1 24 (SUTURE) IMPLANT
SUT PROLENE 6 0 BV (SUTURE) IMPLANT
SUT SILK 0 TIES 10X30 (SUTURE) IMPLANT
SUT SILK 1 SH (SUTURE) IMPLANT
SUT SILK 1 TIES 10X30 (SUTURE) IMPLANT
SUT SILK 2 0 SH (SUTURE) IMPLANT
SUT SILK 2 0 SH CR/8 (SUTURE) IMPLANT
SUT SILK 2 0 TIES 10X30 (SUTURE) IMPLANT
SUT SILK 2-0 18XBRD TIE 12 (SUTURE) IMPLANT
SUT SILK 3 0 SH CR/8 (SUTURE) IMPLANT
SUT SILK 3 0 TIES 10X30 (SUTURE) IMPLANT
SWAB COLLECTION DEVICE MRSA (MISCELLANEOUS) IMPLANT
SWAB CULTURE ESWAB REG 1ML (MISCELLANEOUS) IMPLANT
SYR 50ML LL SCALE MARK (SYRINGE) IMPLANT
SYRINGE TOOMEY DISP (SYRINGE) IMPLANT
TAPE UMBILICAL 1/8 X36 TWILL (MISCELLANEOUS) IMPLANT
TUBE CONNECTING 12X1/4 (SUCTIONS) ×1 IMPLANT
TUBE PU 8FR 16IN ENFIT (TUBING) IMPLANT
VASCULAR TIE MINI RED 18IN STL (MISCELLANEOUS) IMPLANT
WATER STERILE IRR 1000ML POUR (IV SOLUTION) IMPLANT
YANKAUER SUCT BULB TIP NO VENT (SUCTIONS) ×1 IMPLANT

## 2024-01-29 LAB — CULTURE, RESPIRATORY W GRAM STAIN

## 2024-01-30 ENCOUNTER — Encounter (HOSPITAL_COMMUNITY): Payer: Self-pay

## 2024-01-30 LAB — BPAM RBC
Blood Product Expiration Date: 202507052359
Blood Product Expiration Date: 202507052359
Blood Product Expiration Date: 202507092359
Blood Product Expiration Date: 202507092359
Blood Product Expiration Date: 202507092359
ISSUE DATE / TIME: 202506141551
ISSUE DATE / TIME: 202506141551
ISSUE DATE / TIME: 202506141551
ISSUE DATE / TIME: 202506141551
ISSUE DATE / TIME: 202506141551
Unit Type and Rh: 5100
Unit Type and Rh: 5100
Unit Type and Rh: 5100
Unit Type and Rh: 5100
Unit Type and Rh: 5100

## 2024-01-30 LAB — TYPE AND SCREEN
ABO/RH(D): O POS
Antibody Screen: NEGATIVE
Unit division: 0
Unit division: 0
Unit division: 0
Unit division: 0
Unit division: 0

## 2024-01-31 LAB — CULTURE, BLOOD (ROUTINE X 2)
Culture: NO GROWTH
Culture: NO GROWTH
Culture: NO GROWTH
Special Requests: ADEQUATE

## 2024-01-31 LAB — SURGICAL PATHOLOGY

## 2024-02-14 NOTE — Progress Notes (Signed)
   01/19/2024 1745  Spiritual Encounters  Type of Visit Initial  Care provided to: Pt and family  Conversation partners present during encounter Nurse;Physician  Referral source Other (comment)  Reason for visit End-of-life  OnCall Visit Yes   Chaplain responded to page for EOL support to the Patient's family. The Patient was passing and she was an organ donor. Upon request, Chaplain prayed with family, Patient, and medical staff and Honor Walk was performed with prayer at the end. Chaplain provided spiritual care and support to family of Patient.   Chaplain Alphonzo Ask

## 2024-02-14 NOTE — Progress Notes (Signed)
 Wasted 1mg  versed  into the waste bin with Cloyd Dare, RN

## 2024-02-14 NOTE — Progress Notes (Signed)
 Patient's body piercing(s) removed by nurse and family. All body piercing(s) including all earrings and lip ring given to patient's mother and sister at bedside.

## 2024-02-14 NOTE — Progress Notes (Signed)
 Pt transported from 31m06 to or 12 on vent without complications.

## 2024-02-14 NOTE — Progress Notes (Signed)
 Pt extubated in OR per honor bridge

## 2024-02-14 NOTE — Progress Notes (Signed)
 Wasted of morphine  into the waste container with Cloyd Dare, RN

## 2024-02-14 NOTE — Progress Notes (Signed)
 Pt. Time of death 1835. Pronounced by Maura Soulier, RN & Cloyd Dare, RN. Absent breath and heart sounds noted. Patient passed with son and mother at bedside. Family comforted and all questions answered.

## 2024-02-14 NOTE — Progress Notes (Addendum)
 NAME:  Holly Fuentes, MRN:  [id number], DOB:  [birth year], LOS: 2 ADMISSION DATE:  01/26/2024, CONSULTATION DATE:  09/28/2023 REFERRING MD:  Roberts Ching, MD, CHIEF COMPLAINT:  cardiac arrest   History of Present Illness:  A 53 yr old female patient with anxiety, depression, PTSD, COPD, dyslipidemia, HTN, tobacco smoking, opioid abuse, and obesity, who presented to ED after cardiac arrest.  EMS were called to the scene with patient down and by her significant other.  She was sitting in a chair and seemed to be very drowsy.  She fell forward and vomiting profusely.  He tried to clear the vomit and help her breath.  He called 911 and was advised to start CPR, which he attempted.  EMS arrived on scene to find the patient without a pulse. They started CPR, inserted an I-Gel airway, and gave a single Epi.  They did not give Narcan as the husband had given intramuscular Narcan prior to their arrival after being given this by EMS from a prior visit.  She does use opiates and has had overdose in the past.  Hx was taken by chart review and d/w ED MD and staff. The husband has left.   Pertinent  Medical History  anxiety, depression, PTSD, COPD, dyslipidemia, HTN, opioid abuse, obesity   Significant Hospital Events: Including procedures, antibiotic start and stop dates in addition to other pertinent events   Intubated with ETT 7.5 @ 25 cm at the teeth which was retracted to 22 cm. On sedation (Propofol  and Fentanyl ). She has 3 myoclonic jerks while I was examined her and before per the RN.   6/12 transitioned to comfort care then changed to donor workup status prior to extubation  6/13 ongoing donor workup  6/14 expected OR date   Interim History / Subjective:  On vaso for UOP   Objective    Blood pressure (!) 93/55, pulse 62, temperature 97.9 F (36.6 C), resp. rate (!) 28, height 5' 3 (1.6 m), weight 107.1 kg, last menstrual period 01/18/2016, SpO2 97%.    Vent Mode: PRVC FiO2 (%):  [60 %] 60  % Set Rate:  [28 bmp] 28 bmp Vt Set:  [420 mL] 420 mL PEEP:  [5 cmH20] 5 cmH20 Plateau Pressure:  [17 cmH20-18 cmH20] 17 cmH20   Intake/Output Summary (Last 24 hours) at 02/04/2024 1142 Last data filed at 02/07/2024 1100 Gross per 24 hour  Intake 2267.51 ml  Output 1915 ml  Net 352.51 ml   Filed Weights   01/26/24 0219 01/26/24 0352 01/26/24 0800  Weight: 100 kg 108.3 kg 107.1 kg    Examination: General: Critically ill obese middle aged F NAD  HENT: ETT secure  Lungs: Mechanically ventilated  Cardiovascular: rrr  Abdomen: obese round  Extremities: no obvious joint deformity  Neuro: sedated, not triggering vent   Resolved problem list   Assessment and Plan   GOC discussion DNR status Encounter for palliative care Organ donor workup status OOH cardiac arrest Anoxic encephalopathy  Cerebral edema Myoclonus Acute hypoxic and hypercarbic resp failure COPD  Aspiration event/ aspiration PNA  Hypotension  Polysubstance use Elevated LFTs Hx PTSD anxiety depression Hx HTN, HLD Hypernatremia Hyperglycemia Hypermagnesemia Anemia Coagulopathy  P -organ donor w/u per HB -cont MV support, VAP pulm hygiene  -cont on prop morphine  for EOL sx -when she is extubated she should continue on propofol  to help manage her underlying myoclonus  -zosyn   -keppra   -vaso per HB for UOP goal    Best Practice (right  click and Reselect all SmartList Selections daily)   Diet/type: NPO w/ oral meds DVT prophylaxis LMWH Pressure ulcer(s): N/A GI prophylaxis: H2B Lines: CVC art line  Foley:  Yes, and it is still needed Code Status:  full code Last date of multidisciplinary goals of care discussion [ 6/13]  Labs   CBC: Recent Labs  Lab 01/26/24 0151 01/26/24 0422 01/27/24 0840 01/27/24 0843 01/27/24 1328 01/27/24 1834 02/13/2024 0050 01/18/2024 0614  WBC 23.4*   < > 19.0*  --  14.0* 13.7* 13.1* 12.3*  NEUTROABS 8.0*  --   --   --   --   --   --   --   HGB 14.0   < > 12.9  12.9 12.2 11.4* 10.7* 10.4*  HCT 45.9   < > 40.4 38.0 38.5 35.8* 34.1* 33.7*  MCV 99.1   < > 93.7  --  93.9 94.5 95.5 95.7  PLT 357   < > 286  --  225 209 188 198   < > = values in this interval not displayed.    Basic Metabolic Panel: Recent Labs  Lab 01/27/24 0840 01/27/24 0843 01/27/24 1328 01/27/24 1834 01/16/2024 0050 02/12/2024 0614  NA 142 144 147* 147* 148* 147*  K 4.4 4.4 4.6 4.3 4.2 4.3  CL 108  --  112* 113* 113* 112*  CO2 24  --  26 29 28 28   GLUCOSE 145*  --  116* 98 133* 139*  BUN 18  --  17 17 19  22*  CREATININE 0.83  --  0.75 0.81 0.84 0.79  CALCIUM  8.4*  --  8.4* 8.4* 8.4* 8.5*  MG 2.5*  --  2.6* 2.6* 2.6* 2.5*  PHOS 3.9  --  3.8 3.6 3.3 3.1   GFR: Estimated Creatinine Clearance: 97.6 mL/min (by C-G formula based on SCr of 0.79 mg/dL). Recent Labs  Lab 01/26/24 2046 01/27/24 0208 01/27/24 0840 01/27/24 1328 01/27/24 1834 01/22/2024 0050 01/30/2024 0614  WBC  --  16.0*   < > 14.0* 13.7* 13.1* 12.3*  LATICACIDVEN 2.1* 1.7  --   --   --   --   --    < > = values in this interval not displayed.    Liver Function Tests: Recent Labs  Lab 01/27/24 0840 01/27/24 1328 01/27/24 1834 02/04/2024 0050 01/27/2024 0614  AST 42* 39 40 42* 42*  ALT 32 29 25 24 22   ALKPHOS 82 74 68 65 55  BILITOT 0.6 0.4 0.6 0.8 0.7  PROT 6.0* 5.7* 5.6* 5.4* 5.5*  ALBUMIN 2.6* 2.5* 2.3* 2.2* 2.2*   Recent Labs  Lab 01/26/24 0151 01/26/24 2045  LIPASE 36 21  AMYLASE  --  21*   No results for input(s): AMMONIA in the last 168 hours.  ABG    Component Value Date/Time   PHART 7.38 02/10/2024 0614   PCO2ART 51 (H) 01/30/2024 0614   PO2ART 126 (H) 02/01/2024 0614   HCO3 30.2 (H) 01/19/2024 0614   TCO2 29 01/27/2024 0843   ACIDBASEDEF 1.0 01/27/2024 0843   O2SAT 100 01/21/2024 0614     Coagulation Profile: Recent Labs  Lab 01/27/24 0840 01/27/24 1328 01/27/24 1834 01/31/2024 0050 01/29/2024 0614  INR 1.2 1.3* 1.3* 1.3* 1.3*    Cardiac Enzymes: No results for  input(s): CKTOTAL, CKMB, CKMBINDEX, TROPONINI in the last 168 hours.  HbA1C: Hgb A1c MFr Bld  Date/Time Value Ref Range Status  01/26/2024 04:22 AM 5.7 (H) 4.8 - 5.6 % Final    Comment:    (  NOTE) Diagnosis of Diabetes The following HbA1c ranges recommended by the American Diabetes Association (ADA) may be used as an aid in the diagnosis of diabetes mellitus.  Hemoglobin             Suggested A1C NGSP%              Diagnosis  <5.7                   Non Diabetic  5.7-6.4                Pre-Diabetic  >6.4                   Diabetic  <7.0                   Glycemic control for                       adults with diabetes.    01/02/2022 07:13 AM 6.0 (H) 4.8 - 5.6 % Final    Comment:    (NOTE) Pre diabetes:          5.7%-6.4%  Diabetes:              >6.4%  Glycemic control for   <7.0% adults with diabetes     CBG: Recent Labs  Lab 01/27/24 1936 01/27/24 2335 01/17/2024 0338 01/18/2024 0741 01/18/2024 1116  GLUCAP 90 133* 137* 151* 115*    CRITICAL CARE Performed by: Delories Fetter   Total critical care time: 35 minutes  Critical care time was exclusive of separately billable procedures and treating other patients. Critical care was necessary to treat or prevent imminent or life-threatening deterioration.  Critical care was time spent personally by me on the following activities: development of treatment plan with patient and/or surrogate as well as nursing, discussions with consultants, evaluation of patient's response to treatment, examination of patient, obtaining history from patient or surrogate, ordering and performing treatments and interventions, ordering and review of laboratory studies, ordering and review of radiographic studies, pulse oximetry and re-evaluation of patient's condition.  Eston Hence MSN, AGACNP-BC [place] Pulmonary/Critical Care Medicine Amion for pager  01/16/2024, 11:42 AM

## 2024-02-14 NOTE — Death Summary Note (Signed)
 DEATH SUMMARY   Patient Details  Name: Holly Fuentes MRN: [id number] DOB: 03-14-1971  Admission/Discharge Information   Admit Date:  2024/01/27  Date of Death: Date of Death: 2024-01-29  Time of Death: Time of Death: 1835  Length of Stay: 2  Referring Physician: Elam Gray, MD   Reason(s) for Hospitalization  Cardiac arrest  Diagnoses  Preliminary cause of death:  Secondary Diagnoses (including complications and co-morbidities):  Principal Problem:   Cardiac arrest (HCC) Fentanyl  toxicity DNR status Encounter for palliative care Organ donor workup status OOH cardiac arrest Anoxic encephalopathy  Cerebral edema Myoclonus Acute hypoxic and hypercarbic resp failure COPD  Aspiration event/ aspiration PNA  Hypotension  Polysubstance use Elevated LFTs Hx PTSD anxiety depression Hx HTN, HLD Hypernatremia Hyperglycemia Hypermagnesemia Anemia Coagulopathy     Brief Hospital Course (including significant findings, care, treatment, and services provided and events leading to death)  [name] is a 53 y.o. year old female with anxiety, depression, PTSD, COPD, dyslipidemia, HTN, tobacco smoking, opioid abuse, and obesity, who presented to ED after cardiac arrest.  EMS were called to the scene with patient down and by her significant other.  She was sitting in a chair and seemed to be very drowsy.  She fell forward and vomiting profusely.  He tried to clear the vomit and help her breath.  He called 911 and was advised to start CPR, which he attempted.  EMS arrived on scene to find the patient without a pulse. They started CPR, inserted an I-Gel airway, and gave a single Epi.  They did not give Narcan as the husband had given intramuscular Narcan prior to their arrival after being given this by EMS from a prior visit.  She does use opiates and has had overdose in the past.  Hx was taken by chart review and d/w ED MD and staff.  Head CT at admission showed diffuse  cerebral edema. During her hospital stay she had refractory myoclonus and required propofol  to control this. Due to poor prognosis, family elected to withdraw aggressive care measures. Family elected to work with [facility] Lacks for organ donation.    Pertinent Labs and Studies  Significant Diagnostic Studies DG CHEST PORT 1 VIEW Result Date: 01/27/2024 CLINICAL DATA:  Central line placement EXAM: PORTABLE CHEST 1 VIEW COMPARISON:  Chest radiograph 27-Jan-2024 at 1:15 a.m. FINDINGS: Endotracheal tube tip in the intrathoracic trachea 2.7 cm from the carina. Subdiaphragmatic enteric tube. Left IJ CVC tip in the low SVC. Stable cardiomediastinal silhouette. Patchy bilateral perihilar and lower lobe airspace opacities greater on the left. No pleural effusion or pneumothorax. IMPRESSION: 1. Left IJ CVC tip in the low SVC. No pneumothorax. 2. Multifocal pneumonia. Electronically Signed   By: Rozell Cornet M.D.   On: 01/27/2024 01:08   CT CHEST ABDOMEN PELVIS W CONTRAST Result Date: January 27, 2024 CLINICAL DATA:  History of diffuse cerebral edema and potential organ donation EXAM: CT CHEST, ABDOMEN, AND PELVIS WITH CONTRAST TECHNIQUE: Multidetector CT imaging of the chest, abdomen and pelvis was performed following the standard protocol during bolus administration of intravenous contrast. RADIATION DOSE REDUCTION: This exam was performed according to the departmental dose-optimization program which includes automated exposure control, adjustment of the mA and/or kV according to patient size and/or use of iterative reconstruction technique. CONTRAST:  75mL OMNIPAQUE  IOHEXOL  350 MG/ML SOLN COMPARISON:  None Available. FINDINGS: CT CHEST FINDINGS Cardiovascular: Thoracic aorta shows no aneurysmal dilatation or dissection. Heart is not significantly enlarged in size. Pulmonary artery as visualized is within  normal limits. Mediastinum/Nodes: Thoracic inlet is within normal limits. Endotracheal tube and gastric catheter are  seen. No hilar or mediastinal adenopathy is noted. The esophagus is within normal limits. Lungs/Pleura: Lungs demonstrate patchy multifocal opacities which were not well appreciated on recent chest x-ray. No pneumothorax is noted. Musculoskeletal: Degenerative changes of the thoracic spine are noted. Fractures of the right third, fifth and sixth ribs are noted anteriorly likely related to the recent CPR. CT ABDOMEN PELVIS FINDINGS Hepatobiliary: No focal liver abnormality is seen. No gallstones, gallbladder wall thickening, or biliary dilatation. Pancreas: Unremarkable. No pancreatic ductal dilatation or surrounding inflammatory changes. Spleen: Normal in size without focal abnormality. Adrenals/Urinary Tract: Adrenal glands are within normal limits. Kidneys demonstrate a normal enhancement pattern. No renal calculi or obstructive changes are seen. The bladder is decompressed by Foley catheter. Stomach/Bowel: No obstructive or inflammatory changes of the colon are seen. The appendix is within normal limits. Small bowel and stomach are unremarkable. Vascular/Lymphatic: Aortic atherosclerosis. No enlarged abdominal or pelvic lymph nodes. Reproductive: Uterus and bilateral adnexa are unremarkable. Other: No abdominal wall hernia or abnormality. No abdominopelvic ascites. Musculoskeletal: No acute or significant osseous findings. IMPRESSION: New patchy airspace opacities are noted bilaterally Right-sided rib fractures without pneumothorax. No other focal abnormality is noted. Electronically Signed   By: Violeta Grey M.D.   On: 01/26/2024 23:58   ECHOCARDIOGRAM COMPLETE Result Date: 01/26/2024    ECHOCARDIOGRAM REPORT   Patient Name:   Holly Fuentes Date of Exam: 01/26/2024 Medical Rec #:  [id number]      Height:       63.0 in Accession #:    [id number]     Weight:       238.8 lb Date of Birth:  [birth year]      BSA:          2.085 m Patient Age:    53 years       BP:           101/48 mmHg Patient Gender: F               HR:           95 bpm. Exam Location:  Inpatient Procedure: 2D Echo, Cardiac Doppler, Color Doppler and Intracardiac            Opacification Agent (Both Spectral and Color Flow Doppler were            utilized during procedure). Indications:    R94.31 Abnormal EKG. Cardiac arrest.  History:        Patient has prior history of Echocardiogram examinations, most                 recent 01/03/2022. CHF, COPD; Arrythmias:Cardiac Arrest.                 Polysubstance abuse.  Sonographer:    Raynelle Callow RDCS Referring Phys: [id number] OMAR Macario Savin  Sonographer Comments: Technically difficult study due to poor echo windows, patient is obese and echo performed with patient supine and on artificial respirator. IMPRESSIONS  1. Left ventricular ejection fraction, by estimation, is 55 to 60%. The left ventricle has normal function. The left ventricle demonstrates regional wall motion abnormalities (see scoring diagram/findings for description). The left ventricular internal cavity size was mildly dilated. Left ventricular diastolic parameters are indeterminate. There is hypokinesis of the left ventricular, mid inferolateral wall and anterolateral wall.  2. Right ventricular systolic function is normal. The right ventricular size is normal.  3. The  mitral valve is grossly normal. No evidence of mitral valve regurgitation. No evidence of mitral stenosis.  4. The aortic valve was not well visualized. Aortic valve regurgitation is not visualized. No aortic stenosis is present.  5. The inferior vena cava is normal in size with greater than 50% respiratory variability, suggesting right atrial pressure of 3 mmHg. Comparison(s): Changes from prior study are noted. Conclusion(s)/Recommendation(s): Overall normal LVEF, new focal wall motion abnormality in mid lateral wall. Findings communicated to Dr. Felipe Horton. FINDINGS  Left Ventricle: Left ventricular ejection fraction, by estimation, is 55 to 60%. The left ventricle has normal function. The  left ventricle demonstrates regional wall motion abnormalities. Definity  contrast agent was given IV to delineate the left ventricular endocardial borders. The left ventricular internal cavity size was mildly dilated. There is no left ventricular hypertrophy. Left ventricular diastolic parameters are indeterminate. Right Ventricle: The right ventricular size is normal. No increase in right ventricular wall thickness. Right ventricular systolic function is normal. Left Atrium: Left atrial size was normal in size. Right Atrium: Right atrial size was normal in size. Pericardium: There is no evidence of pericardial effusion. Mitral Valve: The mitral valve is grossly normal. No evidence of mitral valve regurgitation. No evidence of mitral valve stenosis. Tricuspid Valve: The tricuspid valve is grossly normal. Tricuspid valve regurgitation is not demonstrated. No evidence of tricuspid stenosis. Aortic Valve: The aortic valve was not well visualized. Aortic valve regurgitation is not visualized. No aortic stenosis is present. Pulmonic Valve: The pulmonic valve was not well visualized. Pulmonic valve regurgitation is not visualized. No evidence of pulmonic stenosis. Aorta: The aortic root is normal in size and structure and the ascending aorta was not well visualized. Venous: The inferior vena cava is normal in size with greater than 50% respiratory variability, suggesting right atrial pressure of 3 mmHg. IAS/Shunts: No atrial level shunt detected by color flow Doppler.  LEFT VENTRICLE PLAX 2D LVIDd:         4.20 cm      Diastology LVIDs:         2.80 cm      LV e' medial:    6.42 cm/s LV PW:         1.00 cm      LV E/e' medial:  15.2 LV IVS:        0.90 cm      LV e' lateral:   9.79 cm/s LVOT diam:     2.20 cm      LV E/e' lateral: 10.0 LV SV:         92 LV SV Index:   44 LVOT Area:     3.80 cm  LV Volumes (MOD) LV vol d, MOD A2C: 109.4 ml LV vol d, MOD A4C: 121.5 ml LV vol s, MOD A2C: 52.8 ml LV vol s, MOD A4C: 54.3 ml  LV SV MOD A2C:     56.6 ml LV SV MOD A4C:     121.5 ml LV SV MOD BP:      63.6 ml RIGHT VENTRICLE             IVC RV S prime:     10.40 cm/s  IVC diam: 1.40 cm TAPSE (M-mode): 2.1 cm LEFT ATRIUM             Index        RIGHT ATRIUM           Index LA diam:        3.20 cm 1.53  cm/m   RA Area:     10.40 cm LA Vol (A2C):   25.5 ml 12.23 ml/m  RA Volume:   21.70 ml  10.41 ml/m LA Vol (A4C):   22.1 ml 10.60 ml/m LA Biplane Vol: 24.2 ml 11.61 ml/m  AORTIC VALVE LVOT Vmax:   125.00 cm/s LVOT Vmean:  86.200 cm/s LVOT VTI:    0.243 m  AORTA Ao Asc diam: 3.50 cm MITRAL VALVE MV Area (PHT): 4.21 cm     SHUNTS MV Decel Time: 180 msec     Systemic VTI:  0.24 m MV E velocity: 97.90 cm/s   Systemic Diam: 2.20 cm MV A velocity: 113.00 cm/s MV E/A ratio:  0.87 Sheryle Donning MD Electronically signed by Sheryle Donning MD Signature Date/Time: 01/26/2024/11:30:12 AM    Final    EEG adult Result Date: 01/26/2024 Arleene Lack, MD     01/26/2024 12:30 PM Patient Name: JODETTE WIK MRN: [id number] Epilepsy Attending: Arleene Lack Referring Physician/Provider: Kimberley Penman, MD Date: 01/26/2024 Duration: 32.34 mins Patient history: 53 year old female status post cardiac arrest.  EEG to evaluate for seizure Level of alertness: comatose AEDs during EEG study: Propofol , Versed  Technical aspects: This EEG study was done with scalp electrodes positioned according to the 10-20 International system of electrode placement. Electrical activity was reviewed with band pass filter of 1-70Hz , sensitivity of 7 uV/mm, display speed of 75mm/sec with a 60Hz  notched filter applied as appropriate. EEG data were recorded continuously and digitally stored.  Video monitoring was available and reviewed as appropriate. Description: EEG showed burst suppression pattern with bursts of highly epileptiform discharges lasting 1 second alternating with generalized suppression lasting 2 to 40 seconds to hyperventilation and photic  stimulation were not performed.   ABNORMALITY - Burst suppression with highly epileptiform bursts, generalized IMPRESSION: This study showed evidence of epileptogenicity with generalized onset and high risk of seizures.  Additionally there is profound diffuse encephalopathy.  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 Dr. Felipe Horton was notified. Arleene Lack   CT Head Wo Contrast Result Date: 01/26/2024 CLINICAL DATA:  Found unresponsive EXAM: CT HEAD WITHOUT CONTRAST TECHNIQUE: Contiguous axial images were obtained from the base of the skull through the vertex without intravenous contrast. RADIATION DOSE REDUCTION: This exam was performed according to the departmental dose-optimization program which includes automated exposure control, adjustment of the mA and/or kV according to patient size and/or use of iterative reconstruction technique. COMPARISON:  None Available. FINDINGS: Brain: Loss of the normal sulcal markings and basilar cisterns is seen. This is consistent with diffuse cerebral edema. The lateral ventricles are small. No acute hemorrhage or mass lesion is seen. Vascular: No hyperdense vessel or unexpected calcification. Skull: Normal. Negative for fracture or focal lesion. Sinuses/Orbits: Mucosal thickening is noted in the left maxillary antrum. Other: None. IMPRESSION: Changes consistent with diffuse cerebral edema Critical Value/emergent results were called by telephone at the time of interpretation on 01/26/2024 at 2:35 am to Dr. Abby Hocking , who verbally acknowledged these results. Electronically Signed   By: Violeta Grey M.D.   On: 01/26/2024 02:36   DG Chest Portable 1 View Result Date: 01/26/2024 CLINICAL DATA:  Status post intubation EXAM: PORTABLE CHEST 1 VIEW COMPARISON:  01/02/2022 FINDINGS: Endotracheal tube is noted at the level of the carina directed towards right mainstem bronchus.  This should be withdrawn 2-3 cm. Cardiac shadow is within normal limits. The lungs  are well aerated without focal infiltrate. No bony abnormality is noted. IMPRESSION: Endotracheal tube is noted at the level of the carina directed towards right mainstem bronchus. This should be withdrawn 2-3 cm. Electronically Signed   By: Violeta Grey M.D.   On: 01/26/2024 02:11   DG Abdomen 1 View Result Date: 01/26/2024 CLINICAL DATA:  Check gastric catheter placement EXAM: ABDOMEN - 1 VIEW COMPARISON:  None Available. FINDINGS: Scattered large and small bowel gas is noted. Gastric catheter is noted in the distal stomach. No free air is seen. IMPRESSION: Gastric catheter within the stomach. Electronically Signed   By: Violeta Grey M.D.   On: 01/26/2024 02:09    Microbiology Recent Results (from the past 240 hours)  MRSA Next Gen by PCR, Nasal     Status: None   Collection Time: 01/26/24  3:55 AM   Specimen: Nasal Mucosa; Nasal Swab  Result Value Ref Range Status   MRSA by PCR Next Gen NOT DETECTED NOT DETECTED Final    Comment: (NOTE) The GeneXpert MRSA Assay (FDA approved for NASAL specimens only), is one component of a comprehensive MRSA colonization surveillance program. It is not intended to diagnose MRSA infection nor to guide or monitor treatment for MRSA infections. Test performance is not FDA approved in patients less than 24 years old. Performed at [facility], [address], [place], [place] [address]   Culture, Respiratory w Gram Stain     Status: None   Collection Time: 01/26/24  4:28 AM   Specimen: Tracheal Aspirate; Respiratory  Result Value Ref Range Status   Specimen Description TRACHEAL ASPIRATE  Final   Special Requests NONE  Final   Gram Stain   Final    ABUNDANT WBC PRESENT, PREDOMINANTLY PMN FEW YEAST FEW GRAM NEGATIVE COCCOBACILLI Performed at [facility], [address] [place], [place] [address]    Culture RARE CANDIDA TROPICALIS  Final   Report Status  01/29/2024 FINAL  Final  Culture, blood (Routine X 2) w Reflex to ID Panel     Status: None (Preliminary result)   Collection Time: 01/26/24 10:06 AM   Specimen: BLOOD RIGHT ARM  Result Value Ref Range Status   Specimen Description BLOOD RIGHT ARM  Final   Special Requests   Final    AEROBIC BOTTLE ONLY Blood Culture results may not be optimal due to an inadequate volume of blood received in culture bottles   Culture   Final    NO GROWTH 4 DAYS Performed at [facility], [address] [place], [place] [address]    Report Status PENDING  Incomplete  Culture, blood (Routine X 2) w Reflex to ID Panel     Status: None (Preliminary result)   Collection Time: 01/26/24  5:17 PM   Specimen: BLOOD  Result Value Ref Range Status   Specimen Description BLOOD SITE NOT SPECIFIED  Final   Special Requests   Final    BOTTLES DRAWN AEROBIC AND ANAEROBIC Blood Culture adequate volume   Culture   Final    NO GROWTH 4 DAYS Performed at [facility], [address] [place], [place] [address]    Report Status PENDING  Incomplete  Culture, blood (Routine X 2) w Reflex to ID Panel     Status: None (Preliminary result)   Collection Time: 01/26/24  6:09 PM   Specimen: BLOOD RIGHT HAND  Result Value Ref Range Status   Specimen Description BLOOD RIGHT HAND  Final  Special Requests   Final    BOTTLES DRAWN AEROBIC ONLY Blood Culture results may not be optimal due to an inadequate volume of blood received in culture bottles   Culture   Final    NO GROWTH 4 DAYS Performed at [facility], [address], [place], [place] [address]    Report Status PENDING  Incomplete  SARS Coronavirus 2 by RT PCR (hospital order, performed in [facility] hospital lab) *cepheid single result test* Anterior Nasal Swab     Status: None   Collection Time: 01/26/24  8:53 PM   Specimen: Anterior Nasal Swab  Result Value Ref Range Status   SARS Coronavirus 2 by RT PCR NEGATIVE NEGATIVE Final    Comment:  Performed at [facility], [address] [place], [place] [address]  Urine Culture (for pregnant, neutropenic or urologic patients or patients with an indwelling urinary catheter)     Status: None   Collection Time: 01/27/24 11:01 AM   Specimen: Urine, Catheterized  Result Value Ref Range Status   Specimen Description URINE, CATHETERIZED  Final   Special Requests NONE  Final   Culture   Final    NO GROWTH Performed at [facility], [address], [place], [place] [address]    Report Status 01/30/2024 FINAL  Final    Lab Basic Metabolic Panel: Recent Labs  Lab 01/27/24 1328 01/27/24 1834 02/07/2024 0050 02/13/2024 0614 02/02/2024 1250 01/24/2024 1307  NA 147* 147* 148* 147* 148* 147*  K 4.6 4.3 4.2 4.3 4.2 4.2  CL 112* 113* 113* 112*  --  112*  CO2 26 29 28 28   --  29  GLUCOSE 116* 98 133* 139*  --  121*  BUN 17 17 19  22*  --  24*  CREATININE 0.75 0.81 0.84 0.79  --  0.83  CALCIUM  8.4* 8.4* 8.4* 8.5*  --  8.3*  MG 2.6* 2.6* 2.6* 2.5*  --  2.7*  PHOS 3.8 3.6 3.3 3.1  --  3.0   Liver Function Tests: Recent Labs  Lab 01/27/24 1328 01/27/24 1834 02/02/2024 0050 02/01/2024 0614 01/24/2024 1307  AST 39 40 42* 42* 34  ALT 29 25 24 22 20   ALKPHOS 74 68 65 55 65  BILITOT 0.4 0.6 0.8 0.7 0.7  PROT 5.7* 5.6* 5.4* 5.5* 5.9*  ALBUMIN 2.5* 2.3* 2.2* 2.2* 2.3*   Recent Labs  Lab 01/26/24 0151 01/26/24 2045  LIPASE 36 21  AMYLASE  --  21*   No results for input(s): AMMONIA in the last 168 hours. CBC: Recent Labs  Lab 01/26/24 0151 01/26/24 0422 01/27/24 1328 01/27/24 1834 01/24/2024 0050 02/13/2024 0614 01/16/2024 1250 02/05/2024 1307  WBC 23.4*   < > 14.0* 13.7* 13.1* 12.3*  --  12.8*  NEUTROABS 8.0*  --   --   --   --   --   --   --   HGB 14.0   < > 12.2 11.4* 10.7* 10.4* 9.5* 10.3*  HCT 45.9   < > 38.5 35.8* 34.1* 33.7* 28.0* 33.2*  MCV 99.1   < > 93.9 94.5 95.5 95.7  --  94.9  PLT 357   < > 225 209 188 198  --  198   < > = values in this interval not  displayed.   Cardiac Enzymes: No results for input(s): CKTOTAL, CKMB, CKMBINDEX, TROPONINI in the last 168 hours. Sepsis Labs: Recent Labs  Lab 01/26/24 2046 01/27/24 0208 01/27/24 0840 01/27/24 1834  01/24/2024 0050 01/23/2024 0614 02/01/2024 1307  WBC  --  16.0*   < > 13.7* 13.1* 12.3* 12.8*  LATICACIDVEN 2.1* 1.7  --   --   --   --   --    < > = values in this interval not displayed.    Procedures/Operations  Intubation CVC A-line   Joesph Mussel 01/30/2024, 3:43 PM

## 2024-02-14 DEATH — deceased
